# Patient Record
Sex: Male | Born: 1985
Health system: Southern US, Community
[De-identification: ages and names within clinical notes are randomized; demographics above are authoritative.]

## PROBLEM LIST (undated history)

## (undated) DIAGNOSIS — E119 Type 2 diabetes mellitus without complications: Secondary | ICD-10-CM

## (undated) DIAGNOSIS — I10 Essential (primary) hypertension: Secondary | ICD-10-CM

## (undated) HISTORY — PX: CARDIAC CATHETERIZATION: SHX172

## (undated) NOTE — *Deleted (*Deleted)
***In Progress*** Referring Physician: PCP: Sean Brunette, MD HF Cardiology: Dr. Shirlee Latch  HPI:  [name] is a 4 y.o. with a history of HTN, obesity, ETOH abuse, systolic heart failure, PE and diabetes who presents for followup of CHF.    Recently admitted early 7/21 with increased dyspnea and leg edema. ECHO showed reduced EF 20-25%. Diuresed with IV furosemide and transitioned to PO furosemide 80 mg daily. Overall diuresed 35 pounds. Had LHC/RHC that showed normal coronaries and elevated filling pressures, CI 2.0. Also new diagnosis of T2DM, Hgb A1c 11.4. Now on metformin + dapagliflozin. He was discharged home 12/03/19 on GDMT w/ plans to repeat 2D echo in 3 months.   Shortly after discharge, he was readmitted on 12/12/19 by IM for acute bilateral PE, he had been sedentary at work and at home. Was referred to ED by PCP for dyspnea and hypoxia. D-dimer elevated at 6.61. EKG showed sinus tachycardia. CT angiogram of the chest confirmed bilateral pulmonary embolism with mild right heart strain. Repeat limited echo showed RV to be mildly enlarged but systolic function normal. LVEF was 30-35%, mildly improved from previous study. BLE Dopplers negative for DVT. Given BMI of 35, not felt to be ideal candidate for DOAC. He was placed on coumadin w/ heparin bridge.   At last appointment with Dr. Shirlee Latch on 02/24/20, he had lost another 25 lbs compared to last appointment. Generally feeling well.  No dyspnea walking on flat ground.  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  Occasional lightheadedness if he stands too fast.  No chest pain.  No orthopnea/PND.   Today he returns to HF clinic for pharmacist medication titration. At last visit with Dr. Shirlee Latch on 02/24/20, carvedilol was increased to dose of 6.25 mg BID and furosemide was changed from daily to PRN.    Overall feeling ***. Dizziness, lightheadedness, fatigue:   Chest pain or palpitations.  How is your breathing?: *** SOB Able to complete all ADLs. Activity level ***  Weight at home pounds. Takes furosemide/torsemide/bumex *** mg *** daily.  PND/Orthopnea:   Appetite ***.   HF Medications: Carvedilol 6.25 mg BID  Entresto 97/103 mg BID  Spironolactone 25 mg daily  Dapagliflozin/metformin 09-998 mg BID Digoxin 0.125 mg daily Furosemide 80 mg PRN for excess fluid  Has the patient been experiencing any side effects to the medications prescribed?  {YES NO:22349}  Does the patient have any problems obtaining medications due to transportation or finances?   {YES [employer]  Understanding of regimen: {excellent/good/fair/poor:19665} Understanding of indications: {excellent/good/fair/poor:19665} Potential of compliance: {excellent/good/fair/poor:19665} Patient understands to avoid NSAIDs. Patient understands to avoid decongestants.    Pertinent Lab Values 02/24/20: . Serum creatinine 1.45, BUN 24, Potassium 4.0, Sodium 134, Digoxin 0.8   Vital Signs: . Weight: *** (last clinic weight: 402 lbs) . Blood pressure: ***  . Heart rate: ***   Assessment:  1. Chronic Systolic CHF: Nonischemic cardiomyopathy.  Echo 11/28/19 was difficult but EF appeared to be 20-25%. Cause is uncertain. However, heavy ETOH may play a role as well as HTN and untreated diabetes. Cannot rule out viral myocarditis. LHC showed no coronary disease. RHC with low CI at 2.0. Repeat Limited Echo 12/12/19 showed slight improvement in LVEF, 30-35%. RV systolic function normal.  On exam today, he is not volume overloaded***.  NYHA class II symptoms.  - Continue furosemide 80 mg PRN for excess fluid  - Continue  carvedilol 6.25 mg BID  - Continue Entresto97/103 BID - Continue spironolactone 25 mg daily. - Continue dapagliflozin-metformin 09/998 mg BID  - Continue digoxin 0.125 daily.  - Continue to remain abstinent from ETOH. - If EF remains <35% on  repeat echo 6 months post-initial echo, will need referral to EP for ICD. ECG is narrow complex so not CRT candidate 2. Bilateral PE: diagnosed 12/13/19. Bilateral LE venous dopplers negative for PE. Limited echo w/ mildly enlarged RV but normal systolic function. Suspect triggered by sedentary lifestyle + CHF.   - Continue warfarin.  3. Type 2 diabetes: Diagnosed in 7/21 with hgbA1c 11.  - on Dapagliglozin/Metformin 09-998 mg BID - followed by PCP   3. OSA: Needs CPAP titration.  4. ETOH abuse: Former heavy drinker. He reports he has quit 5. Obesity: Continue diet/exercise efforts.    Plan: 1) Medication changes: Based on clinical presentation, vital signs and recent labs will *** 2) Labs: *** 3) Follow-up: ***   Karle Plumber, PharmD, BCPS, BCCP, CPP Heart Failure Clinic Pharmacist [phone]

---
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
## 2005-01-03 ENCOUNTER — Encounter: Admission: RE | Admit: 2005-01-03 | Discharge: 2005-01-03 | Payer: Self-pay | Admitting: Sports Medicine

## 2009-01-21 ENCOUNTER — Encounter: Admission: RE | Admit: 2009-01-21 | Discharge: 2009-01-21 | Payer: Self-pay | Admitting: Internal Medicine

## 2016-09-05 DIAGNOSIS — E559 Vitamin D deficiency, unspecified: Secondary | ICD-10-CM | POA: Diagnosis not present

## 2016-09-05 DIAGNOSIS — I10 Essential (primary) hypertension: Secondary | ICD-10-CM | POA: Diagnosis not present

## 2016-09-05 DIAGNOSIS — Z0001 Encounter for general adult medical examination with abnormal findings: Secondary | ICD-10-CM | POA: Diagnosis not present

## 2016-09-05 DIAGNOSIS — R7309 Other abnormal glucose: Secondary | ICD-10-CM | POA: Diagnosis not present

## 2016-09-14 DIAGNOSIS — Z0000 Encounter for general adult medical examination without abnormal findings: Secondary | ICD-10-CM | POA: Diagnosis not present

## 2016-09-14 DIAGNOSIS — R739 Hyperglycemia, unspecified: Secondary | ICD-10-CM | POA: Diagnosis not present

## 2017-08-03 DIAGNOSIS — E785 Hyperlipidemia, unspecified: Secondary | ICD-10-CM | POA: Diagnosis not present

## 2017-08-03 DIAGNOSIS — E559 Vitamin D deficiency, unspecified: Secondary | ICD-10-CM | POA: Diagnosis not present

## 2017-08-03 DIAGNOSIS — R7309 Other abnormal glucose: Secondary | ICD-10-CM | POA: Diagnosis not present

## 2017-08-03 DIAGNOSIS — I10 Essential (primary) hypertension: Secondary | ICD-10-CM | POA: Diagnosis not present

## 2017-08-09 DIAGNOSIS — Z0000 Encounter for general adult medical examination without abnormal findings: Secondary | ICD-10-CM | POA: Diagnosis not present

## 2017-08-09 DIAGNOSIS — I10 Essential (primary) hypertension: Secondary | ICD-10-CM | POA: Diagnosis not present

## 2019-11-27 ENCOUNTER — Inpatient Hospital Stay (HOSPITAL_BASED_OUTPATIENT_CLINIC_OR_DEPARTMENT_OTHER)
Admission: EM | Admit: 2019-11-27 | Discharge: 2019-12-03 | DRG: 286 | Disposition: A | Discharge status: Home / Self Care | Payer: 59 | Attending: Cardiology | Admitting: Cardiology

## 2019-11-27 ENCOUNTER — Other Ambulatory Visit: Payer: Self-pay

## 2019-11-27 ENCOUNTER — Emergency Department (HOSPITAL_BASED_OUTPATIENT_CLINIC_OR_DEPARTMENT_OTHER): Payer: 59

## 2019-11-27 ENCOUNTER — Encounter (HOSPITAL_BASED_OUTPATIENT_CLINIC_OR_DEPARTMENT_OTHER): Payer: Self-pay

## 2019-11-27 DIAGNOSIS — I5021 Acute systolic (congestive) heart failure: Secondary | ICD-10-CM | POA: Diagnosis present

## 2019-11-27 DIAGNOSIS — I429 Cardiomyopathy, unspecified: Secondary | ICD-10-CM | POA: Diagnosis present

## 2019-11-27 DIAGNOSIS — Z9981 Dependence on supplemental oxygen: Secondary | ICD-10-CM

## 2019-11-27 DIAGNOSIS — I5020 Unspecified systolic (congestive) heart failure: Secondary | ICD-10-CM

## 2019-11-27 DIAGNOSIS — I110 Hypertensive heart disease with heart failure: Secondary | ICD-10-CM | POA: Diagnosis not present

## 2019-11-27 DIAGNOSIS — E8770 Fluid overload, unspecified: Secondary | ICD-10-CM | POA: Diagnosis not present

## 2019-11-27 DIAGNOSIS — E1165 Type 2 diabetes mellitus with hyperglycemia: Secondary | ICD-10-CM | POA: Diagnosis present

## 2019-11-27 DIAGNOSIS — I509 Heart failure, unspecified: Secondary | ICD-10-CM

## 2019-11-27 DIAGNOSIS — J9691 Respiratory failure, unspecified with hypoxia: Secondary | ICD-10-CM | POA: Diagnosis present

## 2019-11-27 DIAGNOSIS — Z20822 Contact with and (suspected) exposure to covid-19: Secondary | ICD-10-CM | POA: Diagnosis present

## 2019-11-27 DIAGNOSIS — R000 Tachycardia, unspecified: Secondary | ICD-10-CM | POA: Diagnosis present

## 2019-11-27 DIAGNOSIS — I10 Essential (primary) hypertension: Secondary | ICD-10-CM

## 2019-11-27 DIAGNOSIS — Z452 Encounter for adjustment and management of vascular access device: Secondary | ICD-10-CM

## 2019-11-27 DIAGNOSIS — I428 Other cardiomyopathies: Secondary | ICD-10-CM | POA: Diagnosis present

## 2019-11-27 DIAGNOSIS — F1010 Alcohol abuse, uncomplicated: Secondary | ICD-10-CM | POA: Diagnosis present

## 2019-11-27 DIAGNOSIS — R945 Abnormal results of liver function studies: Secondary | ICD-10-CM | POA: Diagnosis present

## 2019-11-27 DIAGNOSIS — Z79899 Other long term (current) drug therapy: Secondary | ICD-10-CM

## 2019-11-27 DIAGNOSIS — Z6841 Body Mass Index (BMI) 40.0 and over, adult: Secondary | ICD-10-CM

## 2019-11-27 DIAGNOSIS — N5089 Other specified disorders of the male genital organs: Secondary | ICD-10-CM

## 2019-11-27 HISTORY — DX: Essential (primary) hypertension: I10

## 2019-11-27 LAB — URINALYSIS, ROUTINE W REFLEX MICROSCOPIC
Glucose, UA: 250 mg/dL — AB
Ketones, ur: NEGATIVE mg/dL
Leukocytes,Ua: NEGATIVE
Nitrite: NEGATIVE
Protein, ur: 100 mg/dL — AB
Specific Gravity, Urine: >1.03 — ABNORMAL HIGH (ref 1.005–1.030)
pH: 6 (ref 5.0–8.0)

## 2019-11-27 LAB — BASIC METABOLIC PANEL
Anion gap: 12 (ref 5–15)
BUN: 18 mg/dL (ref 6–20)
CO2: 26 mmol/L (ref 22–32)
Calcium: 8.7 mg/dL — ABNORMAL LOW (ref 8.9–10.3)
Chloride: 92 mmol/L — ABNORMAL LOW (ref 98–111)
Creatinine, Ser: 0.98 mg/dL (ref 0.61–1.24)
GFR calc Af Amer: >60 mL/min (ref >60)
GFR calc non Af Amer: >60 mL/min (ref >60)
Glucose, Bld: 248 mg/dL — ABNORMAL HIGH (ref 70–99)
Potassium: 4.3 mmol/L (ref 3.5–5.1)
Sodium: 130 mmol/L — ABNORMAL LOW (ref 135–145)

## 2019-11-27 LAB — URINALYSIS, MICROSCOPIC (REFLEX)

## 2019-11-27 LAB — BRAIN NATRIURETIC PEPTIDE: B Natriuretic Peptide: 441.1 pg/mL — ABNORMAL HIGH (ref 0.0–100.0)

## 2019-11-27 LAB — SARS CORONAVIRUS 2 BY RT PCR (HOSPITAL ORDER, PERFORMED IN ~~LOC~~ HOSPITAL LAB): SARS Coronavirus 2: NEGATIVE

## 2019-11-27 LAB — CBC
HCT: 47 % (ref 39.0–52.0)
Hemoglobin: 15.1 g/dL (ref 13.0–17.0)
MCH: 28.5 pg (ref 26.0–34.0)
MCHC: 32.1 g/dL (ref 30.0–36.0)
MCV: 88.8 fL (ref 80.0–100.0)
Platelets: 246 10*3/uL (ref 150–400)
RBC: 5.29 MIL/uL (ref 4.22–5.81)
RDW: 14.2 % (ref 11.5–15.5)
WBC: 10.4 10*3/uL (ref 4.0–10.5)
nRBC: 0 % (ref 0.0–0.2)

## 2019-11-27 LAB — TROPONIN I (HIGH SENSITIVITY)
Troponin I (High Sensitivity): 55 ng/L — ABNORMAL HIGH (ref <18)
Troponin I (High Sensitivity): 57 ng/L — ABNORMAL HIGH (ref <18)

## 2019-11-27 LAB — CBG MONITORING, ED: Glucose-Capillary: 258 mg/dL — ABNORMAL HIGH (ref 70–99)

## 2019-11-27 MED ORDER — FUROSEMIDE 10 MG/ML IJ SOLN
40.0000 mg | Freq: Once | INTRAMUSCULAR | Status: AC
  Administered 2019-11-27: 40 mg via INTRAVENOUS
  Filled 2019-11-27: qty 4

## 2019-11-27 MED ORDER — SODIUM CHLORIDE 0.9% FLUSH
3.0000 mL | Freq: Once | INTRAVENOUS | Status: DC
  Filled 2019-11-27: qty 3

## 2019-11-27 NOTE — ED Notes (Signed)
amb  In hall with pulse ox , sitting os st 92%, amb 95%, MD ware

## 2019-11-27 NOTE — ED Provider Notes (Signed)
[facility] EMERGENCY DEPARTMENT Provider Note   CSN: [id number] Arrival date & time: 11/27/19  1230     History Chief Complaint  Patient presents with  . Leg Swelling    Sean Lawson is a 34 y.o. male.  The history is provided by the patient, a parent and medical records. No language interpreter was used.  Shortness of Breath Severity:  Severe Onset quality:  Gradual Duration:  2 weeks Timing:  Sporadic Progression:  Waxing and waning Chronicity:  New Context: activity and URI   Relieved by:  Rest Worsened by:  Exertion and coughing Ineffective treatments:  None tried Associated symptoms: cough and vomiting   Associated symptoms: no abdominal pain, no chest pain, no fever, no headaches, no neck pain, no rash, no sore throat and no wheezing   Risk factors: obesity   Risk factors: no hx of PE/DVT and no prolonged immobilization        Past Medical History:  Diagnosis Date  . Hypertension     There are no problems to display for this patient.   History reviewed. No pertinent surgical history.     No family history on file.  Social History   Tobacco Use  . Smoking status: Never Smoker  . Smokeless tobacco: Former Clinical biochemist  . Vaping Use: Never used  Substance Use Topics  . Alcohol use: Yes    Comment: weekly  . Drug use: Never    Home Medications Prior to Admission medications   Not on File    Allergies    Patient has no known allergies.  Review of Systems   Review of Systems  Constitutional: Positive for fatigue. Negative for chills and fever.  HENT: Positive for congestion. Negative for sore throat.   Eyes: Negative for visual disturbance.  Respiratory: Positive for cough, chest tightness and shortness of breath. Negative for wheezing.   Cardiovascular: Positive for leg swelling. Negative for chest pain and palpitations.  Gastrointestinal: Positive for nausea and vomiting. Negative for abdominal pain, constipation and  diarrhea.  Genitourinary: Positive for decreased urine volume. Negative for flank pain and frequency.  Musculoskeletal: Negative for back pain, neck pain and neck stiffness.  Skin: Negative for rash and wound.  Neurological: Negative for weakness, light-headedness, numbness and headaches.  Psychiatric/Behavioral: Negative for agitation and confusion.  All other systems reviewed and are negative.   Physical Exam Updated Vital Signs BP (!) 147/101 (BP Location: Left Arm)   Pulse (!) 106   Temp 98.2 F (36.8 C) (Oral)   Resp 20   Ht 6\' 2"  (1.88 m)   Wt (!) 215.5 kg   SpO2 95%   BMI 60.99 kg/m   Physical Exam Vitals and nursing note reviewed.  Constitutional:      General: He is not in acute distress.    Appearance: He is well-developed. He is not ill-appearing, toxic-appearing or diaphoretic.  HENT:     Head: Normocephalic and atraumatic.     Right Ear: External ear normal.     Left Ear: External ear normal.     Nose: Congestion present. No rhinorrhea.     Mouth/Throat:     Mouth: Mucous membranes are moist.     Pharynx: No oropharyngeal exudate or posterior oropharyngeal erythema.  Eyes:     Extraocular Movements: Extraocular movements intact.     Conjunctiva/sclera: Conjunctivae normal.     Pupils: Pupils are equal, round, and reactive to light.  Cardiovascular:     Rate and Rhythm:  Normal rate and regular rhythm.     Pulses: Normal pulses.     Heart sounds: No murmur heard.   Pulmonary:     Effort: Pulmonary effort is normal. No respiratory distress.     Breath sounds: No stridor. Rales present. No wheezing or rhonchi.  Chest:     Chest wall: No tenderness.  Abdominal:     General: Abdomen is flat.     Palpations: Abdomen is soft.     Tenderness: There is no abdominal tenderness. There is no right CVA tenderness, left CVA tenderness, guarding or rebound.  Musculoskeletal:        General: No tenderness.     Cervical back: Normal range of motion and neck supple.  No tenderness.     Right lower leg: Edema present.     Left lower leg: Edema present.  Skin:    General: Skin is warm and dry.     Findings: No erythema or rash.  Neurological:     General: No focal deficit present.     Mental Status: He is alert and oriented to person, place, and time.     Cranial Nerves: No cranial nerve deficit.     Sensory: No sensory deficit.     Motor: No weakness or abnormal muscle tone.     Coordination: Coordination normal.     Deep Tendon Reflexes: Reflexes normal.  Psychiatric:        Mood and Affect: Mood normal.     ED Results / Procedures / Treatments   Labs (all labs ordered are listed, but only abnormal results are displayed) Labs Reviewed  BASIC METABOLIC PANEL - Abnormal; Notable for the following components:      Result Value   Sodium 130 (*)    Chloride 92 (*)    Glucose, Bld 248 (*)    Calcium 8.7 (*)    All other components within normal limits  URINALYSIS, ROUTINE W REFLEX MICROSCOPIC - Abnormal; Notable for the following components:   Color, Urine AMBER (*)    Specific Gravity, Urine >1.030 (*)    Glucose, UA 250 (*)    Hgb urine dipstick TRACE (*)    Bilirubin Urine SMALL (*)    Protein, ur 100 (*)    All other components within normal limits  BRAIN NATRIURETIC PEPTIDE - Abnormal; Notable for the following components:   B Natriuretic Peptide 441.1 (*)    All other components within normal limits  URINALYSIS, MICROSCOPIC (REFLEX) - Abnormal; Notable for the following components:   Bacteria, UA FEW (*)    All other components within normal limits  CBG MONITORING, ED - Abnormal; Notable for the following components:   Glucose-Capillary 258 (*)    All other components within normal limits  TROPONIN I (HIGH SENSITIVITY) - Abnormal; Notable for the following components:   Troponin I (High Sensitivity) 55 (*)    All other components within normal limits  TROPONIN I (HIGH SENSITIVITY) - Abnormal; Notable for the following components:    Troponin I (High Sensitivity) 57 (*)    All other components within normal limits  SARS CORONAVIRUS 2 BY RT PCR (HOSPITAL ORDER, PERFORMED IN [facility])  CBC    EKG EKG Interpretation  Date/Time:  Wednesday November 27 2019 13:09:44 EDT Ventricular Rate:  111 PR Interval:  176 QRS Duration: 98 QT Interval:  346 QTC Calculation: 470 R Axis:   141 Text Interpretation: Sinus tachycardia Right axis deviation Cannot rule out Anterior infarct , age  undetermined Abnormal ECG No old tracing to compare Confirmed by Susy Frizzle [phone]) on 11/27/2019 1:11:52 PM   Radiology DG Chest 2 View  Result Date: 11/27/2019 CLINICAL DATA:  Bilateral lower extremity swelling and abdominal bloating for 6 days. EXAM: CHEST - 2 VIEW COMPARISON:  None. FINDINGS: There is cardiomegaly and mild interstitial edema. No consolidative process, pneumothorax or pleural effusion. No acute or focal bony abnormality. IMPRESSION: Cardiomegaly and mild interstitial edema. Electronically Signed   By: Drusilla Kanner M.D.   On: 11/27/2019 14:01    Procedures Procedures (including critical care time)  Medications Ordered in ED Medications  furosemide (LASIX) injection 40 mg (40 mg Intravenous Given 11/27/19 2056)    ED Course  I have reviewed the triage vital signs and the nursing notes.  Pertinent labs & imaging results that were available during my care of the patient were reviewed by me and considered in my medical decision making (see chart for details).    MDM Rules/Calculators/A&P                          Sean Lawson is a 34 y.o. male with a past medical history significant for hypertension currently untreated who presents with generalized edema, severe fatigue, cough with associated episodes of nausea vomiting, and exertional shortness of breath.  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  He reports he had worsening shortness of breath with any exertion which is new.  He denies any chest pains with it or palpitations.  He reports the nausea and vomiting only occurs when he is having coughing fits.  He denies any hemoptysis.  He denies history of DVT or PE.  He denies any unilateral component of the swelling.  He says that he has never been want to lay flat and typically lays on his side.  He denies any constipation or diarrhea.  Denies any acute urinary changes.  He reports he was sent from urgent care for further evaluation and work-up and had a Covid test performed this morning.  He is not having any fevers at home.  On my evaluation, patient does have rales in the base of his lungs.  He has edema in both of his legs and his abdomen.  Abdomen was nontender and legs are nontender.  Good pulses present in extremities.  No murmur.  No rhonchi or wheezing.  Patient was slightly tachycardic on my evaluation and his oxygen saturation was wax and wane between the low 90s and mid 80s on room air while speaking with me.  EKG showed no STEMI.  Clinically I am concerned about new heart failure given the symmetric edema ascending towards his torso, exertional shortness of breath, any hypoxia I am seeing on my exam.  Patient had work-up started in triage including a BNP which is elevated at 441.  No previous to compare.  Glucose is elevated at 248 but he denies history of diabetes.  Patient does have low sodium and chloride likely due to the nausea vomiting and decreased oral intake as he is reporting.  He reports no dysuria and his urinalysis does not show any nitrites or leukocytes.  There was some protein and bilirubin in the urine.  Also some hemoglobin and glucose.  Troponin is still in process.  CBC shows no anemia or leukocytosis.  X-ray shows cardiomegaly and pulmonary edema.  I am very concerned about new heart failure in this patient.  Patient was allowed to  ambulate to see was oxygen saturation with you.  Surprisingly, they were in the 90s during his ambulation however when I continue to assess the patient it does drop again into the 80s.  Anticipate shared decision-making conversation to discuss disposition either follow-up with cardiology at discharge versus admission for diuresis and echo and further cardiac work-up for new heart failure causing hypoxia.  7:34 PM I reassessed the patient and his oxygen continues to drop into the 80s at times. The lowest I saw was 83% on room air. It then bounces back into the low mid 90s. Patient's troponins returned and were elevated at 55 and 57 respectively. Had a shared decision-making conversation with patient and family and we discussed all of the concerning findings for new heart failure with the rales on exam, peripheral edema, x-ray showing edema and cardiomegaly, elevated BNP, and elevated troponin with the hypoxia. They agreed to let us call cardiology to discuss admission for echo and further cardiology evaluation.  Cards to call for recommendations.  Cardiology agreed with admission.  They requested 40 of Lasix to get started.  They agreed with admission to their service at [facility] for further evaluation and management of new heart failure.  Patient agrees to admission and will be admitted.   Final Clinical Impression(s) / ED Diagnoses Final diagnoses:  Hypervolemia, unspecified hypervolemia type    Clinical Impression: 1. Hypervolemia, unspecified hypervolemia type     Disposition: Admit  This note was prepared with assistance of Dragon voice recognition software. Occasional wrong-word or sound-a-like substitutions may have occurred due to the inherent limitations of voice recognition software.     Jayven Naill, Canary Brim, MD 11/27/19 [phone]

## 2019-11-27 NOTE — ED Notes (Signed)
ED Provider at bedside. 

## 2019-11-27 NOTE — ED Triage Notes (Signed)
Pt c/o bilat leg swelling, abd bloating x 6 days-sent from UC stating "new onset of diabetes"-also c/o prod cough, n/v x 1 week-NAD-to triage in w/c

## 2019-11-28 ENCOUNTER — Inpatient Hospital Stay (HOSPITAL_COMMUNITY): Payer: 59

## 2019-11-28 ENCOUNTER — Encounter (HOSPITAL_COMMUNITY): Payer: Self-pay | Admitting: Student in an Organized Health Care Education/Training Program

## 2019-11-28 DIAGNOSIS — E8770 Fluid overload, unspecified: Secondary | ICD-10-CM | POA: Diagnosis present

## 2019-11-28 DIAGNOSIS — Z6841 Body Mass Index (BMI) 40.0 and over, adult: Secondary | ICD-10-CM | POA: Diagnosis not present

## 2019-11-28 DIAGNOSIS — I10 Essential (primary) hypertension: Secondary | ICD-10-CM | POA: Diagnosis not present

## 2019-11-28 DIAGNOSIS — I429 Cardiomyopathy, unspecified: Secondary | ICD-10-CM | POA: Diagnosis present

## 2019-11-28 DIAGNOSIS — Z79899 Other long term (current) drug therapy: Secondary | ICD-10-CM | POA: Diagnosis not present

## 2019-11-28 DIAGNOSIS — I5021 Acute systolic (congestive) heart failure: Secondary | ICD-10-CM | POA: Diagnosis present

## 2019-11-28 DIAGNOSIS — I509 Heart failure, unspecified: Secondary | ICD-10-CM | POA: Diagnosis not present

## 2019-11-28 DIAGNOSIS — R000 Tachycardia, unspecified: Secondary | ICD-10-CM | POA: Diagnosis present

## 2019-11-28 DIAGNOSIS — R945 Abnormal results of liver function studies: Secondary | ICD-10-CM | POA: Diagnosis present

## 2019-11-28 DIAGNOSIS — F1010 Alcohol abuse, uncomplicated: Secondary | ICD-10-CM | POA: Diagnosis present

## 2019-11-28 DIAGNOSIS — I428 Other cardiomyopathies: Secondary | ICD-10-CM | POA: Diagnosis present

## 2019-11-28 DIAGNOSIS — I110 Hypertensive heart disease with heart failure: Secondary | ICD-10-CM | POA: Diagnosis present

## 2019-11-28 DIAGNOSIS — Z20822 Contact with and (suspected) exposure to covid-19: Secondary | ICD-10-CM | POA: Diagnosis present

## 2019-11-28 DIAGNOSIS — E119 Type 2 diabetes mellitus without complications: Secondary | ICD-10-CM | POA: Diagnosis not present

## 2019-11-28 DIAGNOSIS — I5043 Acute on chronic combined systolic (congestive) and diastolic (congestive) heart failure: Secondary | ICD-10-CM | POA: Diagnosis not present

## 2019-11-28 DIAGNOSIS — J9691 Respiratory failure, unspecified with hypoxia: Secondary | ICD-10-CM | POA: Diagnosis present

## 2019-11-28 DIAGNOSIS — Z9981 Dependence on supplemental oxygen: Secondary | ICD-10-CM | POA: Diagnosis not present

## 2019-11-28 DIAGNOSIS — E1165 Type 2 diabetes mellitus with hyperglycemia: Secondary | ICD-10-CM | POA: Diagnosis present

## 2019-11-28 LAB — CBC
HCT: 45 % (ref 39.0–52.0)
Hemoglobin: 14.1 g/dL (ref 13.0–17.0)
MCH: 28 pg (ref 26.0–34.0)
MCHC: 31.3 g/dL (ref 30.0–36.0)
MCV: 89.5 fL (ref 80.0–100.0)
Platelets: 227 10*3/uL (ref 150–400)
RBC: 5.03 MIL/uL (ref 4.22–5.81)
RDW: 14.5 % (ref 11.5–15.5)
WBC: 10.1 10*3/uL (ref 4.0–10.5)
nRBC: 0 % (ref 0.0–0.2)

## 2019-11-28 LAB — TSH: TSH: 1.976 u[IU]/mL (ref 0.350–4.500)

## 2019-11-28 LAB — BASIC METABOLIC PANEL
Anion gap: 12 (ref 5–15)
BUN: 14 mg/dL (ref 6–20)
CO2: 33 mmol/L — ABNORMAL HIGH (ref 22–32)
Calcium: 8.5 mg/dL — ABNORMAL LOW (ref 8.9–10.3)
Chloride: 91 mmol/L — ABNORMAL LOW (ref 98–111)
Creatinine, Ser: 1.1 mg/dL (ref 0.61–1.24)
GFR calc Af Amer: >60 mL/min (ref >60)
GFR calc non Af Amer: >60 mL/min (ref >60)
Glucose, Bld: 218 mg/dL — ABNORMAL HIGH (ref 70–99)
Potassium: 4.3 mmol/L (ref 3.5–5.1)
Sodium: 136 mmol/L (ref 135–145)

## 2019-11-28 LAB — LIPID PANEL
Cholesterol: 122 mg/dL (ref 0–200)
HDL: 22 mg/dL — ABNORMAL LOW (ref >40)
LDL Cholesterol: 82 mg/dL (ref 0–99)
Total CHOL/HDL Ratio: 5.5 RATIO
Triglycerides: 89 mg/dL (ref <150)
VLDL: 18 mg/dL (ref 0–40)

## 2019-11-28 LAB — HIV ANTIBODY (ROUTINE TESTING W REFLEX): HIV Screen 4th Generation wRfx: NONREACTIVE

## 2019-11-28 LAB — PROTIME-INR
INR: 1.4 — ABNORMAL HIGH (ref 0.8–1.2)
Prothrombin Time: 16.6 seconds — ABNORMAL HIGH (ref 11.4–15.2)

## 2019-11-28 LAB — GLUCOSE, CAPILLARY
Glucose-Capillary: 194 mg/dL — ABNORMAL HIGH (ref 70–99)
Glucose-Capillary: 212 mg/dL — ABNORMAL HIGH (ref 70–99)
Glucose-Capillary: 239 mg/dL — ABNORMAL HIGH (ref 70–99)
Glucose-Capillary: 181 mg/dL — ABNORMAL HIGH (ref 70–99)
Glucose-Capillary: 188 mg/dL — ABNORMAL HIGH (ref 70–99)

## 2019-11-28 LAB — ECHOCARDIOGRAM COMPLETE
Height: 74.2 in
Weight: 8151.73 oz

## 2019-11-28 LAB — MAGNESIUM: Magnesium: 1.2 mg/dL — ABNORMAL LOW (ref 1.7–2.4)

## 2019-11-28 LAB — HEMOGLOBIN A1C
Hgb A1c MFr Bld: 11.4 % — ABNORMAL HIGH (ref 4.8–5.6)
Mean Plasma Glucose: 280.48 mg/dL

## 2019-11-28 LAB — CBG MONITORING, ED: Glucose-Capillary: 208 mg/dL — ABNORMAL HIGH (ref 70–99)

## 2019-11-28 LAB — APTT: aPTT: 30 seconds (ref 24–36)

## 2019-11-28 MED ORDER — SODIUM CHLORIDE 0.9 % IV SOLN: 250.0000 mL | INTRAVENOUS | Status: DC | PRN

## 2019-11-28 MED ORDER — LISINOPRIL 20 MG PO TABS
20.0000 mg | ORAL_TABLET | Freq: Every day | ORAL | Status: DC
  Administered 2019-11-28: 20 mg via ORAL
  Filled 2019-11-28: qty 1

## 2019-11-28 MED ORDER — ACETAMINOPHEN 325 MG PO TABS
650.0000 mg | ORAL_TABLET | ORAL | Status: DC | PRN
  Administered 2019-11-28 – 2019-11-29 (×5): 650 mg via ORAL
  Filled 2019-11-28 (×5): qty 2

## 2019-11-28 MED ORDER — MAGNESIUM SULFATE 2 GM/50ML IV SOLN
2.0000 g | Freq: Once | INTRAVENOUS | Status: AC
  Administered 2019-11-28: 2 g via INTRAVENOUS
  Filled 2019-11-28: qty 50

## 2019-11-28 MED ORDER — SODIUM CHLORIDE 0.9% FLUSH
3.0000 mL | INTRAVENOUS | Status: DC | PRN
  Administered 2019-11-28: 3 mL via INTRAVENOUS

## 2019-11-28 MED ORDER — ENOXAPARIN SODIUM 120 MG/0.8ML ~~LOC~~ SOLN
120.0000 mg | SUBCUTANEOUS | Status: DC
  Administered 2019-11-28 – 2019-11-29 (×2): 120 mg via SUBCUTANEOUS
  Filled 2019-11-28 (×4): qty 0.8

## 2019-11-28 MED ORDER — FUROSEMIDE 10 MG/ML IJ SOLN
40.0000 mg | Freq: Once | INTRAMUSCULAR | Status: AC
  Administered 2019-11-28: 40 mg via INTRAVENOUS
  Filled 2019-11-28: qty 4

## 2019-11-28 MED ORDER — SODIUM CHLORIDE 0.9% FLUSH
3.0000 mL | Freq: Two times a day (BID) | INTRAVENOUS | Status: DC
  Administered 2019-11-28 – 2019-12-01 (×4): 3 mL via INTRAVENOUS

## 2019-11-28 MED ORDER — ONDANSETRON HCL 4 MG/2ML IJ SOLN: 4.0000 mg | Freq: Four times a day (QID) | INTRAMUSCULAR | Status: DC | PRN

## 2019-11-28 MED ORDER — PERFLUTREN LIPID MICROSPHERE
1.0000 mL | INTRAVENOUS | Status: AC | PRN
  Administered 2019-11-28: 5 mL via INTRAVENOUS
  Filled 2019-11-28: qty 10

## 2019-11-28 MED ORDER — ENOXAPARIN SODIUM 40 MG/0.4ML ~~LOC~~ SOLN: 40.0000 mg | SUBCUTANEOUS | Status: DC

## 2019-11-28 MED ORDER — INSULIN ASPART 100 UNIT/ML ~~LOC~~ SOLN
0.0000 [IU] | Freq: Three times a day (TID) | SUBCUTANEOUS | Status: DC
  Administered 2019-11-28: 3 [IU] via SUBCUTANEOUS
  Administered 2019-11-28 – 2019-11-29 (×2): 5 [IU] via SUBCUTANEOUS
  Administered 2019-11-29: 3 [IU] via SUBCUTANEOUS
  Administered 2019-11-29: 5 [IU] via SUBCUTANEOUS
  Administered 2019-11-30: 3 [IU] via SUBCUTANEOUS
  Administered 2019-11-30: 11 [IU] via SUBCUTANEOUS
  Administered 2019-11-30: 8 [IU] via SUBCUTANEOUS
  Administered 2019-12-01: 5 [IU] via SUBCUTANEOUS
  Administered 2019-12-01: 2 [IU] via SUBCUTANEOUS
  Administered 2019-12-01: 5 [IU] via SUBCUTANEOUS
  Administered 2019-12-02: 3 [IU] via SUBCUTANEOUS
  Administered 2019-12-02: 5 [IU] via SUBCUTANEOUS
  Administered 2019-12-02: 3 [IU] via SUBCUTANEOUS
  Administered 2019-12-03: 5 [IU] via SUBCUTANEOUS
  Administered 2019-12-03: 3 [IU] via SUBCUTANEOUS

## 2019-11-28 MED ORDER — FUROSEMIDE 10 MG/ML IJ SOLN
40.0000 mg | Freq: Two times a day (BID) | INTRAMUSCULAR | Status: DC
  Administered 2019-11-28 – 2019-11-30 (×4): 40 mg via INTRAVENOUS
  Filled 2019-11-28 (×5): qty 4

## 2019-11-28 MED ORDER — METOPROLOL TARTRATE 12.5 MG HALF TABLET
12.5000 mg | ORAL_TABLET | Freq: Two times a day (BID) | ORAL | Status: DC
  Administered 2019-11-28 – 2019-11-29 (×3): 12.5 mg via ORAL
  Filled 2019-11-28 (×3): qty 1

## 2019-11-28 NOTE — Progress Notes (Deleted)
Inpatient Diabetes Program Recommendations  AACE/ADA: New Consensus Statement on Inpatient Glycemic Control (2015)  Target Ranges:  Prepandial:   less than 140 mg/dL      Peak postprandial:   less than 180 mg/dL (1-2 hours)      Critically ill patients:  140 - 180 mg/dL   Lab Results  Component Value Date   GLUCAP 212 (H) 11/28/2019   HGBA1C 11.4 (H) 11/28/2019    Review of Glycemic Control Results for SHERVIN, CYPERT (MRN [id number]) as of 11/28/2019 11:46  Ref. Range 11/27/2019 13:05 11/28/2019 03:27 11/28/2019 04:12 11/28/2019 11:39  Glucose-Capillary Latest Ref Range: 70 - 99 mg/dL 222 (H) 979 (H) 892 (H) 212 (H)   Diabetes history: None  Current orders for Inpatient glycemic control: None  Inpatient Diabetes Program Recommendations:    A1c 11.4% this admission, meeting criteria for new DM diagnosis. At this level of A1c pt may require insulin at time of d/c. Will need close follow up with PCP.  May consider Hospitalist consult for medication dosing for d/c. Please speak with pt and inform him of diagnosis prior to education.   - Order ambulatory referral for diabetes education.  Thanks,  Christena Deem RN, MSN, BC-ADM Inpatient Diabetes Coordinator Team Pager [phone] (8a-5p)

## 2019-11-28 NOTE — Progress Notes (Signed)
Inpatient Diabetes Program    Spoke with patient and mother at bedside about new diabetes diagnosis.  Discussed A1C results (11.4% this admission) and explained what an A1C is. Discussed basic pathophysiology of DM Type 2, basic home care, importance of checking CBGs and maintaining good CBG control to prevent long-term and short-term complications.   Reviewed glucose and A1C goals. Reviewed signs and symptoms of hyperglycemia and hypoglycemia along with treatment for both. Discussed impact of nutrition, exercise, stress, sickness, and medications on diabetes control. Discussed diet, portion sizes, and carbohydrates with pt. Encouraged meal planning and healthy snack.   Ordered Living Well with diabetes booklet and encouraged patient to read through entire book. Informed patient that he may be prescribed oral DM medications hoping to avoid insulin depending on current glucose trends on Novolog Correction scale.  Discussed to take oral medication with food. Asked patient to check his glucose at least 2 times a day (fasting and alternating second check).  Informed patient that RN will be asking him to self perform glucose check before leaving hospital.  Patient verbalized understanding of information discussed and he states that he has no further questions at this time related to diabetes. RNs to provide ongoing basic DM education at bedside with this patient and engage patient to actively check blood glucose and administer insulin injections.   D/c orders: Glucose meter kit order # [id number]  Will reassess in the am regarding the need for basal insulin at time of d/c.  Thanks, Tama Headings RN, MSN, BC-ADM Inpatient Diabetes Coordinator Team Pager [phone] (8a-5p)

## 2019-11-28 NOTE — Plan of Care (Signed)

## 2019-11-28 NOTE — ED Notes (Signed)
Pt transported to [facility] via [facility] 

## 2019-11-28 NOTE — Progress Notes (Signed)
  Echocardiogram 2D Echocardiogram has been performed.  Sean Lawson 11/28/2019, 3:33 PM

## 2019-11-28 NOTE — Progress Notes (Addendum)
Progress Note  Patient Name: Sean Lawson Date of Encounter: 11/28/2019  [facility] Cardiologist: No primary care provider on file.   Subjective   Admitted overnight for new onset CHF. Echo pending. Responding well to Lasix. Coughing improving. Shortness of breath is improving - mainly associated with coughing spells. No chest pain. No palpitations, lightheadedness, dizziness.   Inpatient Medications    Scheduled Meds:  enoxaparin (LOVENOX) injection  120 mg Subcutaneous Q24H   lisinopril  20 mg Oral Daily   metoprolol tartrate  12.5 mg Oral BID   sodium chloride flush  3 mL Intravenous Q12H   Continuous Infusions:  sodium chloride     PRN Meds: sodium chloride, acetaminophen, ondansetron (ZOFRAN) IV, sodium chloride flush   Vital Signs    Vitals:   11/28/19 0258 11/28/19 0425 11/28/19 0648 11/28/19 0826  BP: 127/74 119/75 124/84 113/79  Pulse: (!) 105 (!) 105 (!) 103 (!) 105  Resp: 18 20 20 16   Temp: 98.2 F (36.8 C) 98.2 F (36.8 C) 98.2 F (36.8 C) 98.9 F (37.2 C)  TempSrc: Oral Oral Oral Oral  SpO2: 95% 97% 93% 93%  Weight:  (!) 231.1 kg    Height:  6' 2.2" (1.885 m)      Intake/Output Summary (Last 24 hours) at 11/28/2019 1211 Last data filed at 11/28/2019 0956 Gross per 24 hour  Intake 93 ml  Output 3760 ml  Net -3667 ml   Last 3 Weights 11/28/2019 11/27/2019  Weight (lbs) 509 lb 7.7 oz 475 lb  Weight (kg) 231.1 kg 215.459 kg      Telemetry    Sinus tachycardia with rates in the low 100's. Occasionally in the 120's (suspect this is with ambulation). - Personally Reviewed  ECG    No new ECG tracing today. - Personally Reviewed  Physical Exam   GEN: Morbidly obese Caucasian male. No acute distress.   Neck: Supple. Difficult to assess JVD due to body habitus. Cardiac: Tachycardic. Soft heart sounds. No murmurs, rubs, or gallops appreciated. Respiratory: Clear to auscultation bilaterally. GI: Soft, obese/distended, non-tender. Bowel sounds  present. MS: 2+ pitting edema of bilateral lower extremities.  Neuro:  No focal deficits. Psych: Normal affect. Responds appropriately.  Labs    High Sensitivity Troponin:   Recent Labs  Lab 11/27/19 1654 11/27/19 1831  TROPONINIHS 55* 57*      Chemistry Recent Labs  Lab 11/27/19 1317 11/28/19 0716  NA 130* 136  K 4.3 4.3  CL 92* 91*  CO2 26 33*  GLUCOSE 248* 218*  BUN 18 14  CREATININE 0.98 1.10  CALCIUM 8.7* 8.5*  GFRNONAA >60 >60  GFRAA >60 >60  ANIONGAP 12 12     Hematology Recent Labs  Lab 11/27/19 1317 11/28/19 0716  WBC 10.4 10.1  RBC 5.29 5.03  HGB 15.1 14.1  HCT 47.0 45.0  MCV 88.8 89.5  MCH 28.5 28.0  MCHC 32.1 31.3  RDW 14.2 14.5  PLT 246 227    BNP Recent Labs  Lab 11/27/19 1317  BNP 441.1*     DDimer No results for input(s): DDIMER in the last 168 hours.   Radiology    DG Chest 2 View  Result Date: 11/27/2019 CLINICAL DATA:  Bilateral lower extremity swelling and abdominal bloating for 6 days. EXAM: CHEST - 2 VIEW COMPARISON:  None. FINDINGS: There is cardiomegaly and mild interstitial edema. No consolidative process, pneumothorax or pleural effusion. No acute or focal bony abnormality. IMPRESSION: Cardiomegaly and mild interstitial edema. Electronically  Signed   By: Drusilla Kanner M.D.   On: 11/27/2019 14:01    Cardiac Studies   Echo pending.  Patient Profile     34 y.o. male with a history of hypertension, newly diagnosed type 2 diabetes, morbid obesity, and alcohol abuse who presents with acute onset of shortness of breath and lower extremity edema consistent with new onset CHF. Echo pending.  Assessment & Plan    New Onset Acute CHF - BNP elevated at 441.  - Chest x-ray showed cardiomegaly and mild interstitial edema.  - Echo pending. - Has received 2 doses of IV Lasix 40mg  with good urinary response. 3.7 L of output so far.  - Difficult to accurately assess volume status due to body habitus but patient still volume  overloaded at this time. - Will continue IV Lasix 40mg  twice daily. - Currently on Lisinopril 20mg  daily for now. Suspect EF will be low so will go ahead and stop in anticipation of transitioning to [facility]. - Started on Lopressor 12.5mg  twice daily on admission. Will continue current dose for now and will no up-titrate in acute decompensated settting. - Continue to monitor daily weights, strict I/Os, and renal function.  - The 2 mostly likely etiologies at this time are acute viral illness and heavy alcohol consumption.  - Suspect he will need right/left heart catheterization at some point.  Hypertension - Initially hypertensive but BP much better with diuresis.  - Stop Lisinopril in anticipation of needing Entresto. - Continue Lopressor as above.  Newly Diagnosed Type 2 Diabetes Mellitus - Hemoglobin A1c 11.4.  - Urinalysis at Urgent Care yesterday showed glucose 250 and protein 100.  - Diabetes coordinator consulted recommended Novolog 0-15 units TID. Have placed order. Personally spoke with Diabetes coordinator, , and she will come by later today to speak with patient about new diagnosis. Suspect we will start SGLT2 inhibitor this admission.  Alcohol Abuse - Patient reports consistently drinking a handle of vodka over 4 off days from work (works 4 nights on and 4 nights off). Occasionally drinks more than this.  - Will need to continue to discuss importance of drinking in moderation or complete cessation if EF low and felt to be alcohol induced. Patient states that this will not be a problem.  Hypomagnesemia - Magnesium 1.2. Will supplement. - Potassium 4.3.  - Continue to monitor.  For questions or updates, please contact [facility] Please consult [email or web address] for contact info under        Signed, , PA-C  11/28/2019, 12:11 PM    Personally seen and examined. Agree with above.   Urinating well with IV Lasix.  Discussed with he and his mother.   Understands alcohol can lead to cardiomyopathy.  GEN: Well nourished, well developed, in no acute distress morbidly obese HEENT: normal  Neck: no JVD, carotid bruits, or masses Cardiac: Mildly tachycardic; no murmurs, rubs, or gallops, 4+ edema  Respiratory:  clear to auscultation bilaterally, normal work of breathing GI: soft, nontender, nondistended, + BS MS: no deformity or atrophy  Skin: warm and dry, no rash Neuro:  Alert and Oriented x 3, Strength and sensation are intact Psych: euthymic mood, full affect   Lab work reviewed as above.  Medications reviewed.  Assessment and plan:  Acute systolic heart failure -Continue with IV Lasix.  We will hold lisinopril for the next 36 hours with the anticipation of starting Entresto.  He was started on low-dose metoprolol 12.5 mg twice a day.  Currently not  having any hemodynamic effects.  We will continue.  Discussed left and right heart catheterization in the next couple days.  Depending on echocardiogram, may wish to obtain consultation from advanced heart failure team.  Troponin elevation -55, 57 flat in the setting of acute systolic heart failure.  Uncontrolled diabetes, new diagnosis -Hemoglobin A1c 11, diabetic coordinator consulted.  Donato Schultz, MD

## 2019-11-28 NOTE — Progress Notes (Signed)
[facility] paged to notify of admission.

## 2019-11-28 NOTE — Progress Notes (Signed)
Inpatient Diabetes Program Recommendations  AACE/ADA: New Consensus Statement on Inpatient Glycemic Control (2015)  Target Ranges:  Prepandial:   less than 140 mg/dL      Peak postprandial:   less than 180 mg/dL (1-2 hours)      Critically ill patients:  140 - 180 mg/dL   Lab Results  Component Value Date   GLUCAP 212 (H) 11/28/2019   HGBA1C 11.4 (H) 11/28/2019    Review of Glycemic Control Results for Sean Lawson, Sean Lawson (MRN [id number]) as of 11/28/2019 11:46  Ref. Range 11/27/2019 13:05 11/28/2019 03:27 11/28/2019 04:12 11/28/2019 11:39  Glucose-Capillary Latest Ref Range: 70 - 99 mg/dL 124 (H) 580 (H) 998 (H) 212 (H)   Diabetes history: None  Current orders for Inpatient glycemic control: None  Inpatient Diabetes Program Recommendations:    -  Order Novolog 0-15 units tid and Novolog hs scale.   A1c 11.4% this admission, meeting criteria for new DM diagnosis. At this level of A1c pt may require insulin at time of d/c. Will need close follow up with PCP.  May consider Hospitalist consult for medication dosing for d/c. Please speak with pt and inform him of diagnosis prior to education.   - Order ambulatory referral for outpatient diabetes education.  Based on current glucose trends without insulin on board pt may benefit from sulfonylurea and possibly SGLT-2 inhibitor, may can avoid insulin at this time.  Thanks,  Christena Deem RN, MSN, BC-ADM Inpatient Diabetes Coordinator Team Pager [phone] (8a-5p)

## 2019-11-28 NOTE — Progress Notes (Signed)
Report given to Dois Davenport, RN the charge RN on 4E.   Lillia Pauls RN

## 2019-11-28 NOTE — Progress Notes (Signed)
Admitted via stretcher from [facility] via [facility]. Tele applied,oxygen applied,call bell explained and placed within reach. Clothes, wallet with $23 and license and (3) debit/credit cards,watch and shoes placed in closet. Patient does NOT want anything sent home or locked up. Explained valuable policy. Assessment as charted.

## 2019-11-28 NOTE — H&P (Signed)
Cardiology Admission History and Physical:   Patient ID: Sean Lawson MRN: [id number]; DOB: 1985/08/01   Admission date: 11/27/2019  Primary Care Provider: [name], MD [facility] Cardiologist: No primary care provider on file.  [facility] Electrophysiologist:  None   Chief Complaint: SOB/LE edema  Patient Profile:   30M with HTN and morbid obesity who presents with acute onset LE edema and SOB/DOE c/w newly dx HF.   History of Present Illness:   Sean Lawson reports that last Tuesday he started to have worsening cough which persisted throughout the week.  His cough at times was so severe that it actually caused him to vomit.  Over the weekend he then noticed worsening lower extremity edema along with shortness of breath and profound dyspnea on exertion.  He does note that he has had occasional lower extremity swelling in the past but is usually in the context of significant activity and at the end of the day and is usually gone within 1 day.  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  He does report significant alcohol use when not working.  Usually works 4 nights on and 4 nights off and only works night shift.  During his off days he will often consume a handle of vodka over a 4-day stretch.  He has done his over the past several years.  Occasionally he will drink more than this, most recently on Memorial Day weekend he said it was probably closer to 2 handles.  He does have a sick contact at work who had similar URI symptoms prior to him having the same.  He denies any orthopnea, PND, or significant dyspnea on exertion prior to this week.  He lives at home by himself in a one-story house and had no issue completing ADLs before this week.  Since the weekend he has to stop to catch his breath even just walking from his car into the house.  He denies any chest pain, chest pressure, or  other anginal equivalents.  He does not know if he has any weight gain as he does not check his weight at home.  Past Medical History:  Diagnosis Date  . Hypertension    History reviewed. No pertinent surgical history.   Medications Prior to Admission: Prior to Admission medications   Medication Sig Start Date End Date Taking? Authorizing Provider  amLODipine (NORVASC) 10 MG tablet Take by mouth.    [provider]  lisinopril (ZESTRIL) 10 MG tablet Take by mouth.    [provider]    Allergies:   No Known Allergies  Social History:   Social History   Socioeconomic History  . Marital status: Married    Spouse name: Not on file  . Number of children: Not on file  . Years of education: Not on file  . Highest education level: Not on file  Occupational History  . Not on file  Tobacco Use  . Smoking status: Never Smoker  . Smokeless tobacco: Former Clinical biochemist  . Vaping Use: Never used  Substance and Sexual Activity  . Alcohol use: Yes    Comment: weekly  . Drug use: Never  . Sexual activity: Not on file  Other Topics Concern  . Not on file  Social History Narrative  . Not on file   Social Determinants of Health   Financial Resource Strain:   .  Difficulty of Paying Living Expenses:   Food Insecurity:   . Worried About Programme researcher, broadcasting/film/video in the Last Year:   . Barista in the Last Year:   Transportation Needs:   . Freight forwarder (Medical):   Marland Kitchen Lack of Transportation (Non-Medical):   Physical Activity:   . Days of Exercise per Week:   . Minutes of Exercise per Session:   Stress:   . Feeling of Stress :   Social Connections:   . Frequency of Communication with Friends and Family:   . Frequency of Social Gatherings with Friends and Family:   . Attends Religious Services:   . Active Member of Clubs or Organizations:   . Attends Banker Meetings:   Marland Kitchen Marital Status:   Intimate Partner Violence:   . Fear of  Current or Ex-Partner:   . Emotionally Abused:   Marland Kitchen Physically Abused:   . Sexually Abused:     Family History:   No family hx of premature CAD or SCD  ROS:   Review of Systems: [y] = yes, [ ]  = no       General: Weight gain [ ] ; Weight loss [ ] ; Anorexia [ ] ; Fatigue [ ] ; Fever [ ] ; Chills [ ] ; Weakness [ ]     Cardiac: Chest pain/pressure [ ] ; Resting SOB [y]; Exertional SOB [y]; Orthopnea [ ] ; Pedal Edema [y]; Palpitations [ ] ; Syncope [ ] ; Presyncope [ ] ; Paroxysmal nocturnal dyspnea [ ]     Pulmonary: Cough [y]; Wheezing [ ] ; Hemoptysis [ ] ; Sputum [ ] ; Snoring [ ]     GI: Vomiting [ ] ; Dysphagia [ ] ; Melena [ ] ; Hematochezia [ ] ; Heartburn [ ] ; Abdominal pain [ ] ; Constipation [ ] ; Diarrhea [ ] ; BRBPR [ ]     GU: Hematuria [ ] ; Dysuria [ ] ; Nocturia [ ]   Vascular: Pain in legs with walking [ ] ; Pain in feet with lying flat [ ] ; Non-healing sores [ ] ; Stroke [ ] ; TIA [ ] ; Slurred speech [ ] ;    Neuro: Headaches [ ] ; Vertigo [ ] ; Seizures [ ] ; Paresthesias [ ] ;Blurred vision [ ] ; Diplopia [ ] ; Vision changes [ ]     Ortho/Skin: Arthritis [ ] ; Joint pain [ ] ; Muscle pain [ ] ; Joint swelling [ ] ; Back Pain [ ] ; Rash [ ]     Psych: Depression [ ] ; Anxiety [ ]     Heme: Bleeding problems [ ] ; Clotting disorders [ ] ; Anemia [ ]     Endocrine: Diabetes [ ] ; Thyroid dysfunction [ ]    Physical Exam/Data:   Vitals:   11/27/19 2212 11/27/19 2300 11/28/19 0258 11/28/19 0425  BP: (!) 154/99 (!) 174/100 127/74 119/75  Pulse: (!) 108 (!) 110 (!) 105 (!) 105  Resp: 19 20 18 20   Temp:   98.2 F (36.8 C) 98.2 F (36.8 C)  TempSrc:   Oral Oral  SpO2: 90% 94% 95% 97%  Weight:    (!) 231.1 kg  Height:    6' 2.2" (1.885 m)    Intake/Output Summary (Last 24 hours) at 11/28/2019 0506 Last data filed at 11/28/2019 0400 Gross per 24 hour  Intake 0 ml  Output 1760 ml  Net -1760 ml   Last 3 Weights 11/28/2019 11/27/2019  Weight (lbs) 509 lb 7.7 oz 475 lb  Weight (kg) 231.1 kg 215.459 kg       Body mass index is 65.06 kg/m.  General:  Well nourished, well developed, in no acute distress  HEENT: normal Lymph: no adenopathy Neck: JVD unable to assess 2/2 body habitus  Endocrine:  No thryomegaly Vascular: No carotid bruits; FA pulses 2+ bilaterally without bruits  Cardiac:  normal S1, S2; RRR; no murmur  Lungs:  clear to auscultation bilaterally, no wheezing, rhonchi or rales  Abd: soft, nontender, no hepatomegaly  Ext: 3+ LE edema b/l Musculoskeletal:  No deformities, BUE and BLE strength normal and equal Skin: warm and dry  Neuro:  CNs 2-12 intact, no focal abnormalities noted Psych:  Normal affect   EKG:  The ECG that was done and demonstrates sinus tach on admission   Relevant CV Studies: none  Laboratory Data:  High Sensitivity Troponin:   Recent Labs  Lab 11/27/19 1654 11/27/19 1831  TROPONINIHS 55* 57*      Chemistry Recent Labs  Lab 11/27/19 1317  NA 130*  K 4.3  CL 92*  CO2 26  GLUCOSE 248*  BUN 18  CREATININE 0.98  CALCIUM 8.7*  GFRNONAA >60  GFRAA >60  ANIONGAP 12    No results for input(s): PROT, ALBUMIN, AST, ALT, ALKPHOS, BILITOT in the last 168 hours. Hematology Recent Labs  Lab 11/27/19 1317  WBC 10.4  RBC 5.29  HGB 15.1  HCT 47.0  MCV 88.8  MCH 28.5  MCHC 32.1  RDW 14.2  PLT 246   BNP Recent Labs  Lab 11/27/19 1317  BNP 441.1*    DDimer No results for input(s): DDIMER in the last 168 hours.  Radiology/Studies:  DG Chest 2 View  Result Date: 11/27/2019 CLINICAL DATA:  Bilateral lower extremity swelling and abdominal bloating for 6 days. EXAM: CHEST - 2 VIEW COMPARISON:  None. FINDINGS: There is cardiomegaly and mild interstitial edema. No consolidative process, pneumothorax or pleural effusion. No acute or focal bony abnormality. IMPRESSION: Cardiomegaly and mild interstitial edema. Electronically Signed   By: Drusilla Kanner M.D.   On: 11/27/2019 14:01   New York Heart Association (NYHA) Functional Class NYHA Class  III  Assessment and Plan:   3M with HTN and morbid obesity who presents with acute onset LE edema and SOB/DOE c/w newly dx HF.   1. Acute HF  Mr. Hazel has lab work and symptoms consistent with newly diagnosed heart failure exacerbation.  The 2 most likely etiologies for his heart failure or acute viral illness leading to a viral myocarditis versus heavy alcohol consumption and/or a combination of both. He has had significant alcohol use in the past several years which may partly contributed to failure.  He is not sure if he has had significant weight gain but has had noticeable worsening lower extremity edema. - continue home lisinopril 20 mg PO daily (intermittently taking) - start metop tartrate 12.5 mg PO bid - obtain TTE - lasix 40 mg IV bid - HIV, lipids, TSH, A1c  2. HTN - continue home lisinopril 20 mg PO daily  3. morbid obesity  - encourage lifestyle changes at discharge   Severity of Illness: The appropriate patient status for this patient is INPATIENT. Inpatient status is judged to be reasonable and necessary in order to provide the required intensity of service to ensure the patient's safety. The patient's presenting symptoms, physical exam findings, and initial radiographic and laboratory data in the context of their chronic comorbidities is felt to place them at high risk for further clinical deterioration. Furthermore, it is not anticipated that the patient will be medically stable for discharge from the hospital within 2 midnights of admission. The following factors support  the patient status of inpatient.   " The patient's presenting symptoms include DOE/SOB/LE edema. " The worrisome physical exam findings include LE edema " The initial radiographic and laboratory data are worrisome because of elevated BNP " The chronic co-morbidities include morbid obesity, HTN  * I certify that at the point of admission it is my clinical judgment that the patient will require  inpatient hospital care spanning beyond 2 midnights from the point of admission due to high intensity of service, high risk for further deterioration and high frequency of surveillance required.*   For questions or updates, please contact [facility] Please consult [email or web address] for contact info under   Signed, Linton Rump, MD  11/28/2019 5:06 AM

## 2019-11-29 ENCOUNTER — Inpatient Hospital Stay: Payer: Self-pay

## 2019-11-29 ENCOUNTER — Inpatient Hospital Stay (HOSPITAL_COMMUNITY): Payer: 59

## 2019-11-29 ENCOUNTER — Other Ambulatory Visit (HOSPITAL_COMMUNITY): Payer: 59

## 2019-11-29 ENCOUNTER — Encounter (HOSPITAL_COMMUNITY): Payer: Self-pay | Admitting: Student in an Organized Health Care Education/Training Program

## 2019-11-29 DIAGNOSIS — E119 Type 2 diabetes mellitus without complications: Secondary | ICD-10-CM

## 2019-11-29 DIAGNOSIS — F1010 Alcohol abuse, uncomplicated: Secondary | ICD-10-CM

## 2019-11-29 DIAGNOSIS — I5021 Acute systolic (congestive) heart failure: Secondary | ICD-10-CM

## 2019-11-29 LAB — GLUCOSE, CAPILLARY
Glucose-Capillary: 196 mg/dL — ABNORMAL HIGH (ref 70–99)
Glucose-Capillary: 214 mg/dL — ABNORMAL HIGH (ref 70–99)
Glucose-Capillary: 206 mg/dL — ABNORMAL HIGH (ref 70–99)
Glucose-Capillary: 246 mg/dL — ABNORMAL HIGH (ref 70–99)

## 2019-11-29 LAB — COMPREHENSIVE METABOLIC PANEL
ALT: 82 U/L — ABNORMAL HIGH (ref 0–44)
AST: 56 U/L — ABNORMAL HIGH (ref 15–41)
Albumin: 2.8 g/dL — ABNORMAL LOW (ref 3.5–5.0)
Alkaline Phosphatase: 86 U/L (ref 38–126)
Anion gap: 9 (ref 5–15)
BUN: 17 mg/dL (ref 6–20)
CO2: 35 mmol/L — ABNORMAL HIGH (ref 22–32)
Calcium: 8.3 mg/dL — ABNORMAL LOW (ref 8.9–10.3)
Chloride: 92 mmol/L — ABNORMAL LOW (ref 98–111)
Creatinine, Ser: 1.1 mg/dL (ref 0.61–1.24)
GFR calc Af Amer: >60 mL/min (ref >60)
GFR calc non Af Amer: >60 mL/min (ref >60)
Glucose, Bld: 207 mg/dL — ABNORMAL HIGH (ref 70–99)
Potassium: 4.5 mmol/L (ref 3.5–5.1)
Sodium: 136 mmol/L (ref 135–145)
Total Bilirubin: 2.9 mg/dL — ABNORMAL HIGH (ref 0.3–1.2)
Total Protein: 6.2 g/dL — ABNORMAL LOW (ref 6.5–8.1)

## 2019-11-29 LAB — MAGNESIUM: Magnesium: 1.5 mg/dL — ABNORMAL LOW (ref 1.7–2.4)

## 2019-11-29 MED ORDER — LORAZEPAM 1 MG PO TABS
1.0000 mg | ORAL_TABLET | ORAL | Status: DC | PRN
  Administered 2019-11-29: 2 mg via ORAL
  Filled 2019-11-29: qty 2

## 2019-11-29 MED ORDER — MAGNESIUM SULFATE 4 GM/100ML IV SOLN
4.0000 g | Freq: Once | INTRAVENOUS | Status: AC
  Administered 2019-11-29: 4 g via INTRAVENOUS
  Filled 2019-11-29: qty 100

## 2019-11-29 MED ORDER — SODIUM CHLORIDE 0.9% FLUSH: 10.0000 mL | INTRAVENOUS | Status: DC | PRN

## 2019-11-29 MED ORDER — ALTEPLASE 2 MG IJ SOLR
2.0000 mg | Freq: Once | INTRAMUSCULAR | Status: AC
  Administered 2019-11-29: 2 mg

## 2019-11-29 MED ORDER — FOLIC ACID 1 MG PO TABS
1.0000 mg | ORAL_TABLET | Freq: Every day | ORAL | Status: DC
  Administered 2019-11-29 – 2019-12-03 (×5): 1 mg via ORAL
  Filled 2019-11-29 (×5): qty 1

## 2019-11-29 MED ORDER — CHLORHEXIDINE GLUCONATE CLOTH 2 % EX PADS
6.0000 | MEDICATED_PAD | Freq: Every day | CUTANEOUS | Status: DC
  Administered 2019-11-30 – 2019-12-02 (×3): 6 via TOPICAL

## 2019-11-29 MED ORDER — MAGNESIUM SULFATE 2 GM/50ML IV SOLN
2.0000 g | Freq: Once | INTRAVENOUS | Status: DC
  Filled 2019-11-29: qty 50

## 2019-11-29 MED ORDER — THIAMINE HCL 100 MG PO TABS
100.0000 mg | ORAL_TABLET | Freq: Every day | ORAL | Status: DC
  Administered 2019-11-29 – 2019-12-03 (×5): 100 mg via ORAL
  Filled 2019-11-29 (×5): qty 1

## 2019-11-29 MED ORDER — SPIRONOLACTONE 12.5 MG HALF TABLET
12.5000 mg | ORAL_TABLET | Freq: Every day | ORAL | Status: DC
  Administered 2019-11-29 – 2019-12-01 (×3): 12.5 mg via ORAL
  Filled 2019-11-29 (×3): qty 1

## 2019-11-29 MED ORDER — GUAIFENESIN-DM 100-10 MG/5ML PO SYRP
5.0000 mL | ORAL_SOLUTION | ORAL | Status: DC | PRN
  Administered 2019-11-29 – 2019-12-01 (×7): 5 mL via ORAL
  Filled 2019-11-29 (×7): qty 5

## 2019-11-29 MED ORDER — SODIUM CHLORIDE 0.9% FLUSH
10.0000 mL | Freq: Two times a day (BID) | INTRAVENOUS | Status: DC
  Administered 2019-11-29: 20 mL
  Administered 2019-11-30 – 2019-12-02 (×5): 10 mL

## 2019-11-29 MED ORDER — LIVING WELL WITH DIABETES BOOK
Freq: Once | Status: AC
  Filled 2019-11-29: qty 1

## 2019-11-29 MED ORDER — DIGOXIN 125 MCG PO TABS
0.1250 mg | ORAL_TABLET | Freq: Every day | ORAL | Status: DC
  Administered 2019-11-29 – 2019-12-03 (×5): 0.125 mg via ORAL
  Filled 2019-11-29 (×5): qty 1

## 2019-11-29 MED ORDER — MAGNESIUM OXIDE 400 (241.3 MG) MG PO TABS
400.0000 mg | ORAL_TABLET | Freq: Every day | ORAL | Status: DC
  Administered 2019-11-30 – 2019-12-03 (×4): 400 mg via ORAL
  Filled 2019-11-29 (×4): qty 1

## 2019-11-29 NOTE — Progress Notes (Signed)
Nutrition Education Note  RD consulted for nutrition education regarding a Heart Healthy/Diabetic diet.   RD provided "Heart Healthy Nutrition Therapy" handout from the Academy of Nutrition and Dietetics. Reviewed patient's dietary recall. Provided examples on ways to decrease sodium and fat intake in diet. Discouraged intake of processed foods and use of salt shaker. Encouraged fresh fruits and vegetables as well as whole grain sources of carbohydrates to maximize fiber intake.   RD provided "Nutirtion Therapy with Type II Diabetes" handout from the Academy of Nutrition and Dietetics. Discussed different food groups and their effects on blood sugar, emphasizing carbohydrate-containing foods. Provided list of carbohydrates and recommended serving sizes of common foods.  Discussed importance of controlled and consistent carbohydrate intake throughout the day. Provided examples of ways to balance meals/snacks and encouraged intake of high-fiber, whole grain complex carbohydrates.   Teach back method used.  Expect fair compliance.  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 Eats two meals daily that consist of fast food meals. Drinks mostly diet soda, water, and vodka mixed with Sunkist. States he drinks two shots of vodka on his days off and sometimes more if it's a game day. Discussed importance of eating three meals, how to make meals balanced, how to read a food label, and how to make appropriate beverage substitutions.   Current diet order is 2 g sodium/carb modified, patient is consuming approximately 100% of meals at this time. Labs and medications reviewed. No further nutrition interventions warranted at this time. RD contact information provided. If additional nutrition issues arise, please re-consult RD.  Vanessa Kick RD, LDN Clinical Nutrition Pager listed in AMION

## 2019-11-29 NOTE — Progress Notes (Addendum)
   11/28/19 2140  Vitals  Temp 98.1 F (36.7 C)  Temp Source Oral  BP 120/72  MAP (mmHg) 87  BP Location Left Arm  BP Method Automatic  Patient Position (if appropriate) Sitting  Pulse Rate (!) 103  Pulse Rate Source Monitor  ECG Heart Rate (!) 104  Resp (!) 22  Oxygen Therapy  SpO2 93 %  O2 Device Nasal Cannula  O2 Flow Rate (L/min) 2 L/min  MEWS Score  MEWS Temp 0  MEWS Systolic 0  MEWS Pulse 1  MEWS RR 1  MEWS LOC 0  MEWS Score 2  MEWS Score Color Yellow  H.R. has been up today not new. Mainly with activity and talking. Resp. Has been up today with activity and talking. Have notice they are normal at rest. Patient states he is not short of breath with activity. Modena Morrow nurse aware. Lungs dec bases otherwise clear.

## 2019-11-29 NOTE — Progress Notes (Addendum)
Progress Note  Patient Name: Sean Lawson Date of Encounter: 11/29/2019  [facility] Cardiologist: Donato Schultz, MD   Subjective   Breathing some better, not at baseline.  Has been up and down, is restless but no tremors Cough improving No CP, palps  Inpatient Medications    Scheduled Meds: . enoxaparin (LOVENOX) injection  120 mg Subcutaneous Q24H  . furosemide  40 mg Intravenous BID  . insulin aspart  0-15 Units Subcutaneous TID WC  . metoprolol tartrate  12.5 mg Oral BID  . sodium chloride flush  3 mL Intravenous Q12H   Continuous Infusions: . sodium chloride     PRN Meds: sodium chloride, acetaminophen, guaiFENesin-dextromethorphan, ondansetron (ZOFRAN) IV, sodium chloride flush   Vital Signs    Vitals:   11/29/19 0200 11/29/19 0410 11/29/19 0431 11/29/19 0623  BP: 106/73 129/64  104/78  Pulse: 93 (!) 102 87 93  Resp: 18 (!) 21 (!) 23 17  Temp: 98 F (36.7 C) 97.7 F (36.5 C)  97.7 F (36.5 C)  TempSrc: Oral Oral  Oral  SpO2: 98% 98% 96% 99%  Weight:   (!) 227.4 kg   Height:        Intake/Output Summary (Last 24 hours) at 11/29/2019 0911 Last data filed at 11/29/2019 0416 Gross per 24 hour  Intake 393 ml  Output 2750 ml  Net -2357 ml   Last 3 Weights 11/29/2019 11/28/2019 11/27/2019  Weight (lbs) 501 lb 6.4 oz 509 lb 7.7 oz 475 lb  Weight (kg) 227.434 kg 231.1 kg 215.459 kg      Telemetry    SR, high-normal, occ ST - Personally Reviewed  ECG    No new ECG tracing today. - Personally Reviewed  Physical Exam   GEN: No acute distress.   Neck: No JVD seen, difficult to assess 2nd body habitus Cardiac: RRR, no murmur, no rubs, or gallops.  Respiratory: diminished to auscultation bilaterally with rales in the bases. GI: Soft, nontender, non-distended, sig scrotal edema MS: at least 1+ edema; No deformity. Neuro:  Nonfocal  Psych: Normal affect   Labs    High Sensitivity Troponin:   Recent Labs  Lab 11/27/19 1654 11/27/19 1831  TROPONINIHS  55* 57*      Chemistry Recent Labs  Lab 11/27/19 1317 11/28/19 0716 11/29/19 0238  NA 130* 136 136  K 4.3 4.3 4.5  CL 92* 91* 92*  CO2 26 33* 35*  GLUCOSE 248* 218* 207*  BUN 18 14 17   CREATININE 0.98 1.10 1.10  CALCIUM 8.7* 8.5* 8.3*  PROT  --   --  6.2*  ALBUMIN  --   --  2.8*  AST  --   --  56*  ALT  --   --  82*  ALKPHOS  --   --  86  BILITOT  --   --  2.9*  GFRNONAA >60 >60 >60  GFRAA >60 >60 >60  ANIONGAP 12 12 9      Hematology Recent Labs  Lab 11/27/19 1317 11/28/19 0716  WBC 10.4 10.1  RBC 5.29 5.03  HGB 15.1 14.1  HCT 47.0 45.0  MCV 88.8 89.5  MCH 28.5 28.0  MCHC 32.1 31.3  RDW 14.2 14.5  PLT 246 227    BNP Recent Labs  Lab 11/27/19 1317  BNP 441.1*    Lab Results  Component Value Date   TSH 1.976 11/28/2019   Lab Results  Component Value Date   HGBA1C 11.4 (H) 11/28/2019   Lab Results  Component Value Date   CHOL 122 11/28/2019   HDL 22 (L) 11/28/2019   LDLCALC 82 11/28/2019   TRIG 89 11/28/2019   CHOLHDL 5.5 11/28/2019   Magnesium  Date Value Ref Range Status  11/29/2019 1.5 (L) 1.7 - 2.4 mg/dL Final    Comment:    Performed at [facility], [address] [place], [place] [address]    Radiology    DG Chest 2 View  Result Date: 11/27/2019 CLINICAL DATA:  Bilateral lower extremity swelling and abdominal bloating for 6 days. EXAM: CHEST - 2 VIEW COMPARISON:  None. FINDINGS: There is cardiomegaly and mild interstitial edema. No consolidative process, pneumothorax or pleural effusion. No acute or focal bony abnormality. IMPRESSION: Cardiomegaly and mild interstitial edema. Electronically Signed   By: Drusilla Kanner M.D.   On: 11/27/2019 14:01   ECHOCARDIOGRAM COMPLETE  Result Date: 11/28/2019    ECHOCARDIOGRAM REPORT   Patient Name:   Sean Lawson Date of Exam: 11/28/2019 Medical Rec #:  [id number]       Height:       74.2 in Accession #:    [id number]      Weight:       509.5 lb Date of Birth:  03-07-1986        BSA:           3.240 m Patient Age:    34 years        BP:           113/79 mmHg Patient Gender: M               HR:           105 bpm. Exam Location:  Inpatient Procedure: 2D Echo, Cardiac Doppler, Color Doppler and Intracardiac            Opacification Agent Indications:    CHF-Acute Systolic 428.21 / I50.21  History:        Patient has no prior history of Echocardiogram examinations.                 Risk Factors:Hypertension and Non-Smoker.  Sonographer:    Renella Cunas RDCS Referring Phys: [id number] MATTHEW A CARLISLE  Sonographer Comments: Patient is morbidly obese and Technically difficult study due to poor echo windows. IMPRESSIONS  1. Left ventricular ejection fraction, by estimation, is 20 to 25%. The left ventricle has severely decreased function. Left ventricular endocardial border not optimally defined to evaluate regional wall motion. The left ventricular internal cavity size  was mildly dilated. Left ventricular diastolic parameters are indeterminate.  2. Right ventricular systolic function was not well visualized. The right ventricular size is not well visualized. Tricuspid regurgitation signal is inadequate for assessing PA pressure.  3. The mitral valve was not well visualized. No evidence of mitral valve regurgitation. No evidence of mitral stenosis.  4. The aortic valve is tricuspid. Aortic valve regurgitation is not visualized. No aortic stenosis is present.  5. Aortic dilatation noted. There is mild dilatation of the aortic root measuring 38 mm.  6. The inferior vena cava is normal in size with greater than 50% respiratory variability, suggesting right atrial pressure of 3 mmHg.  7. Very technically difficult study with poor images. FINDINGS  Left Ventricle: Left ventricular ejection fraction, by estimation, is 20 to 25%. The left ventricle has severely decreased function. Left ventricular endocardial border not optimally defined to evaluate regional wall motion. Definity contrast agent was given IV to  delineate the left ventricular  endocardial borders. The left ventricular internal cavity size was mildly dilated. There is no left ventricular hypertrophy. Left ventricular diastolic parameters are indeterminate. Right Ventricle: The right ventricular size is not well visualized. Right vetricular wall thickness was not assessed. Right ventricular systolic function was not well visualized. Tricuspid regurgitation signal is inadequate for assessing PA pressure. Left Atrium: Left atrial size was normal in size. Right Atrium: Right atrial size was not well visualized. Pericardium: There is no evidence of pericardial effusion. Mitral Valve: The mitral valve was not well visualized. No evidence of mitral valve regurgitation. No evidence of mitral valve stenosis. Tricuspid Valve: The tricuspid valve is not well visualized. Tricuspid valve regurgitation is not demonstrated. Aortic Valve: The aortic valve is tricuspid. Aortic valve regurgitation is not visualized. No aortic stenosis is present. Pulmonic Valve: The pulmonic valve was normal in structure. Pulmonic valve regurgitation is not visualized. Aorta: Aortic dilatation noted. There is mild dilatation of the aortic root measuring 38 mm. Venous: The inferior vena cava was not well visualized. The inferior vena cava is normal in size with greater than 50% respiratory variability, suggesting right atrial pressure of 3 mmHg. IAS/Shunts: The interatrial septum was not well visualized.  LEFT VENTRICLE PLAX 2D LVOT diam:     2.40 cm LV SV:         50 LV SV Index:   15 LVOT Area:     4.52 cm  LEFT ATRIUM             Index LA Vol (A2C):   75.8 ml 23.39 ml/m LA Vol (A4C):   87.1 ml 26.88 ml/m LA Biplane Vol: 90.1 ml 27.81 ml/m  AORTIC VALVE LVOT Vmax:   72.60 cm/s LVOT Vmean:  54.200 cm/s LVOT VTI:    0.110 m  AORTA Ao Root diam: 3.80 cm  SHUNTS Systemic VTI:  0.11 m Systemic Diam: 2.40 cm [name] Electronically signed by [name] Signature Date/Time:  11/28/2019/4:17:05 PM    Final     Cardiac Studies   Echo 11/28/2019 - tech difficult study 1. Left ventricular ejection fraction, by estimation, is 20 to 25%. The  left ventricle has severely decreased function. Left ventricular  endocardial border not optimally defined to evaluate regional wall motion.  The left ventricular internal cavity size  was mildly dilated. Left ventricular diastolic parameters are  indeterminate.  2. Right ventricular systolic function was not well visualized. The right  ventricular size is not well visualized. Tricuspid regurgitation signal is  inadequate for assessing PA pressure.  3. The mitral valve was not well visualized. No evidence of mitral valve  regurgitation. No evidence of mitral stenosis.  4. The aortic valve is tricuspid. Aortic valve regurgitation is not  visualized. No aortic stenosis is present.  5. Aortic dilatation noted. There is mild dilatation of the aortic root  measuring 38 mm.  6. The inferior vena cava is normal in size with greater than 50%  respiratory variability, suggesting right atrial pressure of 3 mmHg.  7. Very technically difficult study with poor images.   Patient Profile     34 y.o. male with a history of hypertension, newly diagnosed type 2 diabetes, morbid obesity, and alcohol abuse who presented 07/08 with acute onset of shortness of breath and lower extremity edema consistent with new onset CHF.   Assessment & Plan    New Onset Acute CHF - Systolic CHF, EF 32-67% by echo - good response to Lasix - wt down 8 lbs overnight, I/O  net - 5.5 L - continue IV Lasix for now - CHF team contacted, they will see - last dose lisinopril was 07/07 - started on metop 12.5 mg bid - no doses missed, SBP 100s at times - hopefully will be ready for R/L cath Monday  Hypertension - SBP ok on metoprolol, off lisinopril  Newly Diagnosed Type 2 Diabetes Mellitus - change to ADA low Na diet  - Diabetes team following -  on SSI and insulin, unclear if he will be on insulin at d/c - MD advise on starting Jardiance  Alcohol Abuse - restless, but no tremors or other signs of DTs - add thiamine and folic acid - encourage cessation as outpt - LFTs abnormal, recheck 07/11  Hypomagnesemia - Mg 1.2>>1.5 after 2 gm MgSO4 - will give another 2 gm today, recheck in am - start Mag-ox 400 mg qd, with low EF, keep Mg >2.0  Scrotal edema - [place] ordered, f/u on results  For questions or updates, please contact [facility] Please consult [email or web address] for contact info under        Signed, Theodore Demark, PA-C  11/29/2019, 9:11 AM    Personally seen and examined. Agree with above.   34 year old with newly discovered ejection fraction 20 to 25% with super morbid obesity BMI 64, approximately 500 pounds, heavy alcohol use.  Had a rough night he states.  Been urinating quite a bit.  Did have some right testicular pain, swelling.  Likely hydrocele in the setting of excessive fluid overload.  Checking ultrasound.  Magnesium has been low.  Repleting.  Continuing diuresis with IV Lasix 40 mg twice daily.  Responding well.  Holding ACE inhibitor, tomorrow morning will be 36 hours.  No signs of DTs.  New onset diabetes-insulin sliding scale currently.  We have gone ahead and consulted advanced heart failure team as well.  Spoke with he and his mother.  Obviously we will have to make significant lifestyle modifications.  He states that he is able to stop the alcohol.  Donato Schultz, MD

## 2019-11-29 NOTE — TOC Benefit Eligibility Note (Signed)
Transition of Care [facility]) Benefit Eligibility Note    Patient Details  Name: BLONG BUSK MRN: [id number] Date of Birth: 1986/03/10   Medication/Dose: Wilder Glade  10 MG DAILY  Covered?: Yes     Prescription Coverage Preferred Pharmacy: [place] with Person/Company/Phone Number:: GRACE B. @  [employer] [place] # [phone]  Co-Pay: Johnsie Kindred  Prior Approval: No  Deductible: Met       Memory Argue Phone Number: 11/29/2019, 12:18 PM

## 2019-11-29 NOTE — Progress Notes (Addendum)
Inpatient Diabetes Program Recommendations  AACE/ADA: New Consensus Statement on Inpatient Glycemic Control (2015)  Target Ranges:  Prepandial:   less than 140 mg/dL      Peak postprandial:   less than 180 mg/dL (1-2 hours)      Critically ill patients:  140 - 180 mg/dL   Lab Results  Component Value Date   GLUCAP 196 (H) 11/29/2019   HGBA1C 11.4 (H) 11/28/2019    Review of Glycemic Control  Results for [name], [name] (MRN [id number]) as of 11/29/2019 09:59  Ref. Range 11/28/2019 11:39 11/28/2019 13:25 11/28/2019 16:24 11/28/2019 21:17 11/29/2019 06:10  Glucose-Capillary Latest Ref Range: 70 - 99 mg/dL 332 (H) 951 (H) 884 (H) 188 (H) 196 (H)   Diabetes history: New diagnosis this admission A1c 11.4%  Current orders for Inpatient glycemic control:  Novolog 0-15 units tid  Inpatient Diabetes Program Recommendations:    - Add Glipizide 5 mg bid  - Order ambulatory referral for outpatient diabetes education. - Order referral to Dr. Quillian Quince  At [facility]  [address] [place], [place] [address]  [phone]  Based on current glucose trends with only Novolog on board pt may benefit from a low dose SGLT-2 inhibitor (does have risk of genitourinary issues, will need to make sure pt is not at risk for these), may can avoid insulin at this time.  Thanks,  Christena Deem RN, MSN, BC-ADM Inpatient Diabetes Coordinator Team Pager [phone] (8a-5p)  Spoke with pt again today regarding questions about DM diagnosis and how to manage his glucose at home. Discussed with him nutrition education outpatient and possibly going to the healthy weight and wellness clinic to see Dr. Dalbert Garnet. Pt does not have any questions at this time. Pt having difficulty watching DM videos. I think our video system is down. I have discussed all information with pt that is contained within the DM videos.

## 2019-11-29 NOTE — Discharge Instructions (Signed)
outpatient diabetes education Consider seeing Dr. Quillian Quince  At [facility]             [address] [place], [place] [address]             [phone]

## 2019-11-29 NOTE — Progress Notes (Signed)
Patient has been given list of Diabetic video's and and CHF video to watch. Enc. Him to read the books on Diabetes.

## 2019-11-29 NOTE — Consult Note (Addendum)
Advanced Heart Failure Team Consult Note   Primary Physician: [name], MD PCP-Cardiologist:  Donato Schultz, MD  Reason for Consultation: Acute Systolic HF   HPI:    Sean Lawson is seen today for evaluation of Acute Systolic Heart Failure at the request of Dr Anne Fu.   Mr Bossi is a 34 year old with a history of HTN, obesity, and ETOH abuse. Says he was borderline for diabetes. Drinks heavily, vodka when he is not working. Denies drug use.    Over the last 2 week he started coughing and had shortness of breath with exertion. Denies chest pain.  Says he has been told he snores. Falls a sleep easily. Takes all meds. 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    Presented to [facility] with in increased shortness of breath. CXR with cardiomegaly and interstitial edema. Started on IV lasix. ECHO completed and showed EF 20-25%, RV not well visualized.  Pertinent admission labs: Hgb A1C 11.4, HS Trop 55>57, BNP 441, HIV NR. Brisk diuresis noted. Weight down 3 pounds but negative 6.3 liters.   Review of Systems: [y] = yes,  = no   . General: Weight gain [ Y]; Weight loss ; Anorexia ; Fatigue [Y]; Fever ; Chills ; Weakness [Y ]  . Cardiac: Chest pain/pressure ; Resting SOB ; Exertional SOB [Y]; Orthopnea [Y ]; Pedal Edema [ Y]; Palpitations ; Syncope ; Presyncope ; Paroxysmal nocturnal dyspnea[ ]   . Pulmonary: Cough [Y ]; Wheezing[ ] ; Hemoptysis[ ] ; Sputum ; Snoring [Y ]  . GI: Vomiting[ ] ; Dysphagia[ ] ; Melena[ ] ; Hematochezia ; Heartburn[ ] ; Abdominal pain ; Constipation ; Diarrhea ; BRBPR   . GU: Hematuria[ ] ; Dysuria ; Nocturia[ ]   . Vascular: Pain in legs with walking ; Pain in feet with lying flat ; Non-healing sores ; Stroke ; TIA ; Slurred speech ;  . Neuro: Headaches[ ] ; Vertigo[ ] ; Seizures[ ] ; Paresthesias[ ] ;Blurred vision ; Diplopia ; Vision changes   . Ortho/Skin: Arthritis ; Joint pain [Y ]; Muscle pain ; Joint  swelling ; Back Pain [Y ]; Rash   . Psych: Depression[ ] ; Anxiety[ ]   . Heme: Bleeding problems ; Clotting disorders ; Anemia   . Endocrine: Diabetes  Thyroid dysfunction[ ]   Home Medications Prior to Admission medications   Medication Sig Start Date End Date Taking? Authorizing Provider  acetaminophen (TYLENOL) 500 MG tablet Take 1,000 mg by mouth every 6 (six) hours as needed for mild pain.   Yes [provider]  amLODipine (NORVASC) 10 MG tablet Take 10 mg by mouth daily.    Yes [provider]  lisinopril (ZESTRIL) 10 MG tablet Take by mouth.   Yes [provider]  Potassium 95 MG TABS Take 95 mg by mouth daily. Took only for 2-3 days   Yes [provider]    Past Medical History: Past Medical History:  Diagnosis Date  . Hypertension     Past Surgical History: History reviewed. No pertinent surgical history.  Family History: Family History  Problem Relation Age of Onset  . Diabetes Mellitus II Mother     Social History: Social History   Socioeconomic History  . Marital status: Single    Spouse name: Not on file  . Number of children: 0  . Years of education: Not on  file  . Highest education level: Master's degree (e.g., MA, MS, MEng, MEd, MSW, MBA)  Occupational History  . 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 Smoking status: Never Smoker  . Smokeless tobacco: Former Neurosurgeon    Types: Snuff  Vaping Use  . Vaping Use: Never used  Substance and Sexual Activity  . Alcohol use: Yes    Alcohol/week: 30.0 standard drinks    Types: 30 Shots of liquor per week    Comment: weekly  . Drug use: Never  . Sexual activity: Not Currently    Birth control/protection: None  Other Topics Concern  . Not on file  Social History Narrative  . Not on file   Social Determinants of Health   Financial Resource Strain:   . Difficulty of Paying Living Expenses:   Food Insecurity:   . Worried About Programme researcher, broadcasting/film/video in the  Last Year:   . Barista in the Last Year:   Transportation Needs:   . Freight forwarder (Medical):   Marland Kitchen Lack of Transportation (Non-Medical):   Physical Activity:   . Days of Exercise per Week:   . Minutes of Exercise per Session:   Stress:   . Feeling of Stress :   Social Connections:   . Frequency of Communication with Friends and Family:   . Frequency of Social Gatherings with Friends and Family:   . Attends Religious Services:   . Active Member of Clubs or Organizations:   . Attends Banker Meetings:   Marland Kitchen Marital Status:     Allergies:  No Known Allergies  Objective:    Vital Signs:   Temp:  [97.7 F (36.5 C)-98.6 F (37 C)] 98.6 F (37 C) (07/09 1036) Pulse Rate:  [87-104] 93 (07/09 0623) Resp:  [17-27] 17 (07/09 0623) BP: (100-129)/(64-78) 104/78 (07/09 0623) SpO2:  [92 %-99 %] 99 % (07/09 0623) Weight:  [227.4 kg] 227.4 kg (07/09 0431) Last BM Date: 11/28/19  Weight change: Filed Weights   11/27/19 1251 11/28/19 0425 11/29/19 0431  Weight: (!) 215.5 kg (!) 231.1 kg (!) 227.4 kg    Intake/Output:   Intake/Output Summary (Last 24 hours) at 11/29/2019 1044 Last data filed at 11/29/2019 1037 Gross per 24 hour  Intake 360 ml  Output 3050 ml  Net -2690 ml      Physical Exam    General:  No resp difficulty HEENT: normal Neck: supple. JVP difficult to assess due to body habitus.  Carotids 2+ bilat; no bruits. No lymphadenopathy or thyromegaly appreciated. Cor: PMI nondisplaced. Tachy Regular rate & rhythm. No rubs, or murmurs. + S3  Lungs: clear Abdomen: obese, soft, nontender, nondistended. No hepatosplenomegaly. No bruits or masses. Good bowel sounds. Extremities: no cyanosis, clubbing, rash, Rand LLE 2+  edema Neuro: alert & orientedx3, cranial nerves grossly intact. moves all 4 extremities w/o difficulty. Affect pleasant   Telemetry   Sinus Tach 100s   EKG    Sinus Tach 111 bpm   Labs   Basic Metabolic Panel: Recent  Labs  Lab 11/27/19 1317 11/28/19 0716 11/29/19 0238  NA 130* 136 136  K 4.3 4.3 4.5  CL 92* 91* 92*  CO2 26 33* 35*  GLUCOSE 248* 218* 207*  BUN 18 14 17   CREATININE 0.98 1.10 1.10  CALCIUM 8.7* 8.5* 8.3*  MG  --  1.2* 1.5*    Liver Function Tests: Recent Labs  Lab 11/29/19 0238  AST 56*  ALT 82*  ALKPHOS 86  BILITOT 2.9*  PROT 6.2*  ALBUMIN 2.8*   No results for input(s): LIPASE, AMYLASE in the last 168 hours. No results for input(s): AMMONIA in the last 168 hours.  CBC: Recent Labs  Lab 11/27/19 1317 11/28/19 0716  WBC 10.4 10.1  HGB 15.1 14.1  HCT 47.0 45.0  MCV 88.8 89.5  PLT 246 227    Cardiac Enzymes: No results for input(s): CKTOTAL, CKMB, CKMBINDEX, TROPONINI in the last 168 hours.  BNP: BNP (last 3 results) Recent Labs    11/27/19 1317  BNP 441.1*    ProBNP (last 3 results) No results for input(s): PROBNP in the last 8760 hours.   CBG: Recent Labs  Lab 11/28/19 1139 11/28/19 1325 11/28/19 1624 11/28/19 2117 11/29/19 0610  GLUCAP 212* 239* 181* 188* 196*    Coagulation Studies: Recent Labs    11/28/19 0716  LABPROT 16.6*  INR 1.4*     Imaging   ECHOCARDIOGRAM COMPLETE  Result Date: 11/28/2019    ECHOCARDIOGRAM REPORT   Patient Name:   BAYLOR CORTEZ Manolis Date of Exam: 11/28/2019 Medical Rec #:  [id number]       Height:       74.2 in Accession #:    [id number]      Weight:       509.5 lb Date of Birth:  1985/07/02        BSA:          3.240 m Patient Age:    34 years        BP:           113/79 mmHg Patient Gender: M               HR:           105 bpm. Exam Location:  Inpatient Procedure: 2D Echo, Cardiac Doppler, Color Doppler and Intracardiac            Opacification Agent Indications:    CHF-Acute Systolic 428.21 / I50.21  History:        Patient has no prior history of Echocardiogram examinations.                 Risk Factors:Hypertension and Non-Smoker.  Sonographer:    Renella Cunas RDCS Referring Phys: [id number] MATTHEW A CARLISLE   Sonographer Comments: Patient is morbidly obese and Technically difficult study due to poor echo windows. IMPRESSIONS  1. Left ventricular ejection fraction, by estimation, is 20 to 25%. The left ventricle has severely decreased function. Left ventricular endocardial border not optimally defined to evaluate regional wall motion. The left ventricular internal cavity size  was mildly dilated. Left ventricular diastolic parameters are indeterminate.  2. Right ventricular systolic function was not well visualized. The right ventricular size is not well visualized. Tricuspid regurgitation signal is inadequate for assessing PA pressure.  3. The mitral valve was not well visualized. No evidence of mitral valve regurgitation. No evidence of mitral stenosis.  4. The aortic valve is tricuspid. Aortic valve regurgitation is not visualized. No aortic stenosis is present.  5. Aortic dilatation noted. There is mild dilatation of the aortic root measuring 38 mm.  6. The inferior vena cava is normal in size with greater than 50% respiratory variability, suggesting right atrial pressure of 3 mmHg.  7. Very technically difficult study with poor images. FINDINGS  Left Ventricle: Left ventricular ejection fraction, by estimation, is 20 to 25%. The left ventricle has severely decreased function. Left ventricular endocardial border not optimally defined to evaluate  regional wall motion. Definity contrast agent was given IV to delineate the left ventricular endocardial borders. The left ventricular internal cavity size was mildly dilated. There is no left ventricular hypertrophy. Left ventricular diastolic parameters are indeterminate. Right Ventricle: The right ventricular size is not well visualized. Right vetricular wall thickness was not assessed. Right ventricular systolic function was not well visualized. Tricuspid regurgitation signal is inadequate for assessing PA pressure. Left Atrium: Left atrial size was normal in size. Right  Atrium: Right atrial size was not well visualized. Pericardium: There is no evidence of pericardial effusion. Mitral Valve: The mitral valve was not well visualized. No evidence of mitral valve regurgitation. No evidence of mitral valve stenosis. Tricuspid Valve: The tricuspid valve is not well visualized. Tricuspid valve regurgitation is not demonstrated. Aortic Valve: The aortic valve is tricuspid. Aortic valve regurgitation is not visualized. No aortic stenosis is present. Pulmonic Valve: The pulmonic valve was normal in structure. Pulmonic valve regurgitation is not visualized. Aorta: Aortic dilatation noted. There is mild dilatation of the aortic root measuring 38 mm. Venous: The inferior vena cava was not well visualized. The inferior vena cava is normal in size with greater than 50% respiratory variability, suggesting right atrial pressure of 3 mmHg. IAS/Shunts: The interatrial septum was not well visualized.  LEFT VENTRICLE PLAX 2D LVOT diam:     2.40 cm LV SV:         50 LV SV Index:   15 LVOT Area:     4.52 cm  LEFT ATRIUM             Index LA Vol (A2C):   75.8 ml 23.39 ml/m LA Vol (A4C):   87.1 ml 26.88 ml/m LA Biplane Vol: 90.1 ml 27.81 ml/m  AORTIC VALVE LVOT Vmax:   72.60 cm/s LVOT Vmean:  54.200 cm/s LVOT VTI:    0.110 m  AORTA Ao Root diam: 3.80 cm  SHUNTS Systemic VTI:  0.11 m Systemic Diam: 2.40 cm [name] Electronically signed by [name] Signature Date/Time: 11/28/2019/4:17:05 PM    Final       Medications:     Current Medications: . enoxaparin (LOVENOX) injection  120 mg Subcutaneous Q24H  . folic acid  1 mg Oral Daily  . furosemide  40 mg Intravenous BID  . insulin aspart  0-15 Units Subcutaneous TID WC  . magnesium oxide  400 mg Oral Daily  . metoprolol tartrate  12.5 mg Oral BID  . sodium chloride flush  3 mL Intravenous Q12H  . thiamine  100 mg Oral Daily     Infusions: . sodium chloride    . magnesium sulfate bolus IVPB         Assessment/Plan     1. Acute Systolic HF ECHO EF 20-25% RV ok. It is possible this is related to alcohol abuse + HTN.  TSH ok, HiV NR.  Will need cath once diuresed. Will set up for Monday.  - I am going order PICC to guide diuresis and check CO-OX. I am concerned he may have low output heart failure with tachycardia.  - Continue to diurese with IV lasix.  - Hold BB for now.  - Add digoxin 0.125 mg daily  - Add 12.5 mg spironolactone daily  - Renal function stable. Follow  - Consult cardiac rehab.    2. Newly diagnosed DMII, uncontrolled -Hgb A1C 11 - Consult diabetes coordinator. - Continue SSI.   3. Hypomagnesia Replacing mag.   4. Suspected Sleep Apnea Will  need sleep study   5. Obesity  Body mass index is 64.03 kg/m.  6. ETOH Abuse Drinks heavily when he is not working.  Discussed cessation   7. HTN Stable. Anticipate adding entresto.      Length of Stay: 1  Amy Clegg, NP  11/29/2019, 10:44 AM  Advanced Heart Failure Team Pager [phone] (M-F; 7a - 4p)  Please contact [facility] for night-coverage after hours (4p -7a ) and weekends on [email or web address]  Patient seen with NP, agree with the above note.   He reports exertional dyspnea and peripheral edema x 2 weeks.  He was admitted through the ER and noted to be volume overloaded.  He has diuresed well so far with Lasix 40 mg IV bid.    I reviewed his echo.  It is a difficult study due to body habitus.  LV EF appears to be in the 20-25% range on contrast images.   General: NAD, obese.  Neck: JVP 14 cm with thick neck, no thyromegaly or thyroid nodule.  Lungs: Clear to auscultation bilaterally with normal respiratory effort. CV: Nondisplaced PMI.  Heart regular S1/S2, no S3/S4, no murmur.  1+ edema to thighs.  No carotid bruit.  Normal pedal pulses. Abdomen: Soft, nontender, no hepatosplenomegaly, no distention.  Skin: Intact without lesions or rashes.  Neurologic: Alert and oriented x 3.  Psych: Normal affect. Extremities: No  clubbing or cyanosis.  HEENT: Normal.   1. Acute systolic CHF: Echo was difficult but EF appears to be 20-25%.  Symptoms for at least 2 wks, no definite trigger.  No chest pain.  Cause is uncertain.  However, heavy ETOH may play a role as well as HTN and untreated diabetes.  Cannot rule out viral myocarditis.  Cannot rule out coronary disease as he has risk factors.  ECG is narrow complex so not CRT candidate.  On exam, he is still volume overloaded.   Sinus tachycardia is concerning for possible low cardiac output.  - Lasix 40 mg IV bid, he is responding to this well so far.  - Place PICC, followup CVP and co-ox over weekend (exam is difficult).  - Start digoxin 0.125 daily.  - Start spironolactone 12.5 daily.  - Entresto to start if BP stays stable.  - RHC/LHC on Monday after further diuresis.  We discussed risks/benefits and he agrees to procedure.  2. Type 2 diabetes: New diagnosis with hgbA1c 11.  Per primary team.  3. Suspect OSA: Outpatient sleep study.  4. ETOH abuse: Heavy drinker when not working.  We discussed cutting this out, he feels like he can.   Marca Ancona 11/29/2019 4:51 PM

## 2019-11-30 DIAGNOSIS — I5043 Acute on chronic combined systolic (congestive) and diastolic (congestive) heart failure: Secondary | ICD-10-CM

## 2019-11-30 LAB — BASIC METABOLIC PANEL
Anion gap: 12 (ref 5–15)
BUN: 15 mg/dL (ref 6–20)
CO2: 34 mmol/L — ABNORMAL HIGH (ref 22–32)
Calcium: 7.8 mg/dL — ABNORMAL LOW (ref 8.9–10.3)
Chloride: 90 mmol/L — ABNORMAL LOW (ref 98–111)
Creatinine, Ser: 0.82 mg/dL (ref 0.61–1.24)
GFR calc Af Amer: >60 mL/min (ref >60)
GFR calc non Af Amer: >60 mL/min (ref >60)
Glucose, Bld: 196 mg/dL — ABNORMAL HIGH (ref 70–99)
Potassium: 3.9 mmol/L (ref 3.5–5.1)
Sodium: 136 mmol/L (ref 135–145)

## 2019-11-30 LAB — COOXEMETRY PANEL
Carboxyhemoglobin: 1.8 % — ABNORMAL HIGH (ref 0.5–1.5)
Methemoglobin: 0.5 % (ref 0.0–1.5)
O2 Saturation: 75.2 %
Total hemoglobin: 13.3 g/dL (ref 12.0–16.0)

## 2019-11-30 LAB — GLUCOSE, CAPILLARY
Glucose-Capillary: 193 mg/dL — ABNORMAL HIGH (ref 70–99)
Glucose-Capillary: 274 mg/dL — ABNORMAL HIGH (ref 70–99)
Glucose-Capillary: 305 mg/dL — ABNORMAL HIGH (ref 70–99)
Glucose-Capillary: 196 mg/dL — ABNORMAL HIGH (ref 70–99)

## 2019-11-30 LAB — MAGNESIUM: Magnesium: 1.6 mg/dL — ABNORMAL LOW (ref 1.7–2.4)

## 2019-11-30 MED ORDER — FUROSEMIDE 10 MG/ML IJ SOLN
80.0000 mg | Freq: Two times a day (BID) | INTRAMUSCULAR | Status: DC
  Administered 2019-11-30 – 2019-12-02 (×4): 80 mg via INTRAVENOUS
  Filled 2019-11-30 (×4): qty 8

## 2019-11-30 MED ORDER — ENOXAPARIN SODIUM 120 MG/0.8ML ~~LOC~~ SOLN
115.0000 mg | SUBCUTANEOUS | Status: DC
  Administered 2019-11-30: 115 mg via SUBCUTANEOUS
  Filled 2019-11-30: qty 0.76

## 2019-11-30 MED ORDER — SACUBITRIL-VALSARTAN 24-26 MG PO TABS
1.0000 | ORAL_TABLET | Freq: Two times a day (BID) | ORAL | Status: DC
  Administered 2019-11-30 – 2019-12-02 (×5): 1 via ORAL
  Filled 2019-11-30 (×5): qty 1

## 2019-11-30 MED ORDER — MAGNESIUM SULFATE 50 % IJ SOLN
3.0000 g | Freq: Once | INTRAVENOUS | Status: AC
  Administered 2019-11-30: 3 g via INTRAVENOUS
  Filled 2019-11-30: qty 6

## 2019-11-30 MED ORDER — ENOXAPARIN SODIUM 120 MG/0.8ML ~~LOC~~ SOLN: 115.0000 mg | SUBCUTANEOUS | Status: DC

## 2019-11-30 MED ORDER — POTASSIUM CHLORIDE CRYS ER 20 MEQ PO TBCR
40.0000 meq | EXTENDED_RELEASE_TABLET | Freq: Once | ORAL | Status: AC
  Administered 2019-11-30: 40 meq via ORAL
  Filled 2019-11-30: qty 2

## 2019-11-30 NOTE — Progress Notes (Signed)
Changed CVP set-up due to inability to read. New reading  CVP= 21. Thomas Hoff, RN

## 2019-11-30 NOTE — Progress Notes (Signed)
Patient ID: Sean Lawson, male   DOB: 09-Sep-1985, 34 y.o.   MRN: [id number]     Advanced Heart Failure Rounding Note  PCP-Cardiologist: Donato Schultz, MD   Subjective:    Patient diuresed with IV Lasix yesterday, weight down 3 lbs.  CVP 13-14 last night, not working this morning.  Co-ox good at 75%.    Objective:   Weight Range: (!) 226.2 kg Body mass index is 63.68 kg/m.   Vital Signs:   Temp:  [97.8 F (36.6 C)-98.8 F (37.1 C)] 98.3 F (36.8 C) (07/10 0518) Pulse Rate:  [95-106] 106 (07/10 1020) Resp:  [17-24] 20 (07/10 1020) BP: (110-137)/(67-98) 128/87 (07/10 1020) SpO2:  [95 %-100 %] 100 % (07/10 1020) Weight:  [226.2 kg] 226.2 kg (07/10 0500) Last BM Date: 11/29/19  Weight change: Filed Weights   11/28/19 0425 11/29/19 0431 11/30/19 0500  Weight: (!) 231.1 kg (!) 227.4 kg (!) 226.2 kg    Intake/Output:   Intake/Output Summary (Last 24 hours) at 11/30/2019 1130 Last data filed at 11/30/2019 1049 Gross per 24 hour  Intake 1050 ml  Output 2125 ml  Net -1075 ml      Physical Exam    General:  Well appearing. No resp difficulty HEENT: Normal Neck: Thick. JVP 12+. Carotids 2+ bilat; no bruits. No lymphadenopathy or thyromegaly appreciated. Cor: PMI nondisplaced. Regular rate & rhythm. No rubs, gallops or murmurs. Lungs: Clear Abdomen: Soft, nontender, nondistended. No hepatosplenomegaly. No bruits or masses. Good bowel sounds. Extremities: No cyanosis, clubbing, rash. 1+ edema to thighs.  Neuro: Alert & orientedx3, cranial nerves grossly intact. moves all 4 extremities w/o difficulty. Affect pleasant   Telemetry   NSR 90s-100s (personally reviewed)  Labs    CBC Recent Labs    11/27/19 1317 11/28/19 0716  WBC 10.4 10.1  HGB 15.1 14.1  HCT 47.0 45.0  MCV 88.8 89.5  PLT 246 227   Basic Metabolic Panel Recent Labs    65/78/46 0238 11/30/19 0257  NA 136 136  K 4.5 3.9  CL 92* 90*  CO2 35* 34*  GLUCOSE 207* 196*  BUN 17 15  CREATININE 1.10  0.82  CALCIUM 8.3* 7.8*  MG 1.5* 1.6*   Liver Function Tests Recent Labs    11/29/19 0238  AST 56*  ALT 82*  ALKPHOS 86  BILITOT 2.9*  PROT 6.2*  ALBUMIN 2.8*   No results for input(s): LIPASE, AMYLASE in the last 72 hours. Cardiac Enzymes No results for input(s): CKTOTAL, CKMB, CKMBINDEX, TROPONINI in the last 72 hours.  BNP: BNP (last 3 results) Recent Labs    11/27/19 1317  BNP 441.1*    ProBNP (last 3 results) No results for input(s): PROBNP in the last 8760 hours.   D-Dimer No results for input(s): DDIMER in the last 72 hours. Hemoglobin A1C Recent Labs    11/28/19 0716  HGBA1C 11.4*   Fasting Lipid Panel Recent Labs    11/28/19 0716  CHOL 122  HDL 22*  LDLCALC 82  TRIG 89  CHOLHDL 5.5   Thyroid Function Tests Recent Labs    11/28/19 0716  TSH 1.976    Other results:   Imaging    US SCROTUM W/DOPPLER  Result Date: 11/29/2019 CLINICAL DATA:  Scrotal edema for 5 days, RIGHT takes obscured pain for 1 day EXAM: SCROTAL ULTRASOUND DOPPLER ULTRASOUND OF THE TESTICLES TECHNIQUE: Complete ultrasound examination of the testicles, epididymis, and other scrotal structures was performed. Color and spectral Doppler ultrasound were also utilized to  evaluate blood flow to the testicles. COMPARISON:  None FINDINGS: Right testicle Measurements: 3.8 x 2.2 x 2.4 cm. Normal echogenicity without mass or calcification. Internal blood flow present on color Doppler imaging. Left testicle Measurements: 3.7 x 2.5 x 2.4 cm. Normal echogenicity without mass or calcification. Internal blood flow present on color Doppler imaging. Right epididymis:  Normal in size and appearance. Left epididymis:  Normal in size and appearance. Hydrocele:  Small BILATERAL hydroceles Varicocele:  None visualized. Pulsed Doppler interrogation of both testes demonstrates normal low resistance arterial and venous waveforms bilaterally. Marked scrotal swelling bilaterally. Scrotal wall measures in  excess of 4 cm thick. Striations of fluid/edema diffusely throughout the scrotal wall. Few scattered areas of linear increased nonshadowing echogenicity are identified within the scrotal wall, likely fat planes. No shadowing echogenic foci are seen to suggest calcification or gas. IMPRESSION: Normal testes and epididymi. Marked scrotal wall thickening/edema. No definite echogenic or shadowing foci are identified to suggest gas/Fournier's gangrene though if this is a clinical concern recommend CT. Findings called to Dr. Anne Fu on 11/29/2019 at 1522 hours. Electronically Signed   By: Ulyses Southward M.D.   On: 11/29/2019 15:24      Medications:     Scheduled Medications: . Chlorhexidine Gluconate Cloth  6 each Topical Daily  . digoxin  0.125 mg Oral Daily  . enoxaparin (LOVENOX) injection  120 mg Subcutaneous Q24H  . folic acid  1 mg Oral Daily  . furosemide  80 mg Intravenous BID  . insulin aspart  0-15 Units Subcutaneous TID WC  . magnesium oxide  400 mg Oral Daily  . potassium chloride  40 mEq Oral Once  . sacubitril-valsartan  1 tablet Oral BID  . sodium chloride flush  10-40 mL Intracatheter Q12H  . sodium chloride flush  3 mL Intravenous Q12H  . spironolactone  12.5 mg Oral Daily  . thiamine  100 mg Oral Daily     Infusions: . sodium chloride    . magnesium sulfate bolus IVPB       PRN Medications:  sodium chloride, acetaminophen, guaiFENesin-dextromethorphan, LORazepam, ondansetron (ZOFRAN) IV, sodium chloride flush, sodium chloride flush   Assessment/Plan   1. Acute systolic CHF: Echo was difficult but EF appears to be 20-25%.  Symptoms for at least 2 wks, no definite trigger.  No chest pain.  Cause is uncertain.  However, heavy ETOH may play a role as well as HTN and untreated diabetes.  Cannot rule out viral myocarditis.  Cannot rule out coronary disease as he has risk factors.  ECG is narrow complex so not CRT candidate.  On exam, he is still volume overloaded.   Sinus  tachycardia has been concerning for possible low cardiac output but co-ox 75% this morning.  CVP line not working this morning, was 13-14 last night.  - Increase Lasix to 80 mg IV bid, replace K and Mg.  - Nursing to troubleshoot PICC.  - Continue digoxin 0.125 daily.  - Continue spironolactone 12.5 daily.  - Start Entresto 24/26 bid.   - RHC/LHC on Monday after further diuresis.  We discussed risks/benefits and he agrees to procedure.  2. Type 2 diabetes: New diagnosis with hgbA1c 11.  Per primary team.  3. Suspect OSA: Outpatient sleep study.  4. ETOH abuse: Heavy drinker when not working.  We discussed cutting this out, he feels like he can.   Length of Stay: 2  Marca Ancona, MD  11/30/2019, 11:30 AM  Advanced Heart Failure Team Pager [phone] (M-F; 7a -  4p)  Please contact [facility] for night-coverage after hours (4p -7a ) and weekends on [email or web address]

## 2019-11-30 NOTE — Progress Notes (Signed)
Multiple attempts to get CVP on patient. Will have charge RN Kathlene November assist when patient in bed and ready. Thomas Hoff, RN

## 2019-11-30 NOTE — Progress Notes (Signed)
CARDIAC REHAB PHASE I   PRE:  Rate/Rhythm: 104 ST    BP: sitting 133/95    SaO2: 100 2L  MODE:  Ambulation: 470 ft   POST:  Rate/Rhythm: 118 ST Max rate during ambulation, 108 recovery post walk    BP: sitting 145/90     SaO2: 98 2L  [id number] Patient ambulated independently in hallway with slow stead gait. RN assist with O2 and IV pole. C/O mild leg weakness and shortness of breath. Offered sitting and standing rest breaks however patient declined. VSS during ambulation with lowest sat 97%. Post ambulation patient assisted to bedside sitting. RN assisted with Breakfast set up. Call bell and phone in reach. Patient encouraged to elevate scrotum to decrease discomfort and edema. Encouraged to ambulate as tolerated. Will follow up on Monday.  Sean Sebree Hoover Brunette RN, BSN

## 2019-12-01 LAB — BASIC METABOLIC PANEL
Anion gap: 11 (ref 5–15)
BUN: 14 mg/dL (ref 6–20)
CO2: 37 mmol/L — ABNORMAL HIGH (ref 22–32)
Calcium: 8.1 mg/dL — ABNORMAL LOW (ref 8.9–10.3)
Chloride: 87 mmol/L — ABNORMAL LOW (ref 98–111)
Creatinine, Ser: 0.92 mg/dL (ref 0.61–1.24)
GFR calc Af Amer: >60 mL/min (ref >60)
GFR calc non Af Amer: >60 mL/min (ref >60)
Glucose, Bld: 270 mg/dL — ABNORMAL HIGH (ref 70–99)
Potassium: 4.1 mmol/L (ref 3.5–5.1)
Sodium: 135 mmol/L (ref 135–145)

## 2019-12-01 LAB — HEPATIC FUNCTION PANEL
ALT: 59 U/L — ABNORMAL HIGH (ref 0–44)
AST: 40 U/L (ref 15–41)
Albumin: 2.7 g/dL — ABNORMAL LOW (ref 3.5–5.0)
Alkaline Phosphatase: 100 U/L (ref 38–126)
Bilirubin, Direct: 0.9 mg/dL — ABNORMAL HIGH (ref 0.0–0.2)
Indirect Bilirubin: 1.4 mg/dL — ABNORMAL HIGH (ref 0.3–0.9)
Total Bilirubin: 2.3 mg/dL — ABNORMAL HIGH (ref 0.3–1.2)
Total Protein: 6.8 g/dL (ref 6.5–8.1)

## 2019-12-01 LAB — COOXEMETRY PANEL
Carboxyhemoglobin: 2.5 % — ABNORMAL HIGH (ref 0.5–1.5)
Methemoglobin: 0.8 % (ref 0.0–1.5)
O2 Saturation: 78.2 %
Total hemoglobin: 13.8 g/dL (ref 12.0–16.0)

## 2019-12-01 LAB — GLUCOSE, CAPILLARY
Glucose-Capillary: 147 mg/dL — ABNORMAL HIGH (ref 70–99)
Glucose-Capillary: 238 mg/dL — ABNORMAL HIGH (ref 70–99)
Glucose-Capillary: 210 mg/dL — ABNORMAL HIGH (ref 70–99)
Glucose-Capillary: 218 mg/dL — ABNORMAL HIGH (ref 70–99)

## 2019-12-01 LAB — MAGNESIUM: Magnesium: 1.6 mg/dL — ABNORMAL LOW (ref 1.7–2.4)

## 2019-12-01 MED ORDER — CARVEDILOL 3.125 MG PO TABS
3.1250 mg | ORAL_TABLET | Freq: Two times a day (BID) | ORAL | Status: DC
  Administered 2019-12-01 – 2019-12-03 (×4): 3.125 mg via ORAL
  Filled 2019-12-01 (×4): qty 1

## 2019-12-01 MED ORDER — ENOXAPARIN SODIUM 120 MG/0.8ML ~~LOC~~ SOLN
110.0000 mg | SUBCUTANEOUS | Status: DC
  Administered 2019-12-01: 110 mg via SUBCUTANEOUS
  Filled 2019-12-01 (×3): qty 0.74

## 2019-12-01 MED ORDER — SODIUM CHLORIDE 0.9% FLUSH
3.0000 mL | Freq: Two times a day (BID) | INTRAVENOUS | Status: DC
  Administered 2019-12-01 – 2019-12-02 (×3): 3 mL via INTRAVENOUS

## 2019-12-01 MED ORDER — SODIUM CHLORIDE 0.9 % IV SOLN: INTRAVENOUS | Status: DC

## 2019-12-01 MED ORDER — SPIRONOLACTONE 25 MG PO TABS
25.0000 mg | ORAL_TABLET | Freq: Every day | ORAL | Status: DC
  Administered 2019-12-02 – 2019-12-03 (×2): 25 mg via ORAL
  Filled 2019-12-01 (×2): qty 1

## 2019-12-01 MED ORDER — SODIUM CHLORIDE 0.9% FLUSH: 3.0000 mL | INTRAVENOUS | Status: DC | PRN

## 2019-12-01 MED ORDER — SODIUM CHLORIDE 0.9 % IV SOLN: 250.0000 mL | INTRAVENOUS | Status: DC | PRN

## 2019-12-01 MED ORDER — MAGNESIUM SULFATE 2 GM/50ML IV SOLN
2.0000 g | Freq: Once | INTRAVENOUS | Status: AC
  Administered 2019-12-01: 2 g via INTRAVENOUS
  Filled 2019-12-01: qty 50

## 2019-12-01 MED ORDER — ASPIRIN 81 MG PO CHEW
81.0000 mg | CHEWABLE_TABLET | ORAL | Status: AC
  Administered 2019-12-02: 81 mg via ORAL
  Filled 2019-12-01: qty 1

## 2019-12-01 MED ORDER — SPIRONOLACTONE 12.5 MG HALF TABLET
12.5000 mg | ORAL_TABLET | Freq: Once | ORAL | Status: AC
  Administered 2019-12-01: 12.5 mg via ORAL
  Filled 2019-12-01: qty 1

## 2019-12-01 NOTE — Progress Notes (Signed)
Patient ID: Sean Lawson, male   DOB: 01/23/1986, 34 y.o.   MRN: [id number]     Advanced Heart Failure Rounding Note  PCP-Cardiologist: Donato Schultz, MD   Subjective:    Patient diuresed with IV Lasix yesterday, weight down 7 lbs.  Still having trouble with CVP line, getting new set-up.  Co-ox good at 78%.    Objective:   Weight Range: (!) 222.9 kg Body mass index is 62.75 kg/m.   Vital Signs:   Temp:  [98.1 F (36.7 C)-98.7 F (37.1 C)] 98.1 F (36.7 C) (07/11 0812) Pulse Rate:  [100-108] 100 (07/11 0812) Resp:  [15-28] 15 (07/11 0812) BP: (106-141)/(78-91) 124/86 (07/11 0812) SpO2:  [91 %-100 %] 91 % (07/11 0812) Weight:  [222.9 kg] 222.9 kg (07/11 0448) Last BM Date: 11/29/19  Weight change: Filed Weights   11/29/19 0431 11/30/19 0500 12/01/19 0448  Weight: (!) 227.4 kg (!) 226.2 kg (!) 222.9 kg    Intake/Output:   Intake/Output Summary (Last 24 hours) at 12/01/2019 1140 Last data filed at 12/01/2019 1140 Gross per 24 hour  Intake 610 ml  Output 6545 ml  Net -5935 ml      Physical Exam    General: NAD Neck: Thick, JVP 12 cm, no thyromegaly or thyroid nodule.  Lungs: Clear to auscultation bilaterally with normal respiratory effort. CV: Nondisplaced PMI.  Heart regular S1/S2, no S3/S4, no murmur.  1+ edema to knees.  Abdomen: Soft, nontender, no hepatosplenomegaly, no distention.  Skin: Intact without lesions or rashes.  Neurologic: Alert and oriented x 3.  Psych: Normal affect. Extremities: No clubbing or cyanosis.  HEENT: Normal.    Telemetry   NSR 90s-100s (personally reviewed)  Labs    CBC No results for input(s): WBC, NEUTROABS, HGB, HCT, MCV, PLT in the last 72 hours. Basic Metabolic Panel Recent Labs    13/24/40 0257 12/01/19 1020  NA 136 135  K 3.9 4.1  CL 90* 87*  CO2 34* 37*  GLUCOSE 196* 270*  BUN 15 14  CREATININE 0.82 0.92  CALCIUM 7.8* 8.1*  MG 1.6* 1.6*   Liver Function Tests Recent Labs    11/29/19 0238  12/01/19 1020  AST 56* 40  ALT 82* 59*  ALKPHOS 86 100  BILITOT 2.9* 2.3*  PROT 6.2* 6.8  ALBUMIN 2.8* 2.7*   No results for input(s): LIPASE, AMYLASE in the last 72 hours. Cardiac Enzymes No results for input(s): CKTOTAL, CKMB, CKMBINDEX, TROPONINI in the last 72 hours.  BNP: BNP (last 3 results) Recent Labs    11/27/19 1317  BNP 441.1*    ProBNP (last 3 results) No results for input(s): PROBNP in the last 8760 hours.   D-Dimer No results for input(s): DDIMER in the last 72 hours. Hemoglobin A1C No results for input(s): HGBA1C in the last 72 hours. Fasting Lipid Panel No results for input(s): CHOL, HDL, LDLCALC, TRIG, CHOLHDL, LDLDIRECT in the last 72 hours. Thyroid Function Tests No results for input(s): TSH, T4TOTAL, T3FREE, THYROIDAB in the last 72 hours.  Invalid input(s): FREET3  Other results:   Imaging    No results found.   Medications:     Scheduled Medications: . carvedilol  3.125 mg Oral BID WC  . Chlorhexidine Gluconate Cloth  6 each Topical Daily  . digoxin  0.125 mg Oral Daily  . enoxaparin (LOVENOX) injection  110 mg Subcutaneous Q24H  . folic acid  1 mg Oral Daily  . furosemide  80 mg Intravenous BID  . insulin aspart  0-15 Units Subcutaneous TID WC  . magnesium oxide  400 mg Oral Daily  . sacubitril-valsartan  1 tablet Oral BID  . sodium chloride flush  10-40 mL Intracatheter Q12H  . sodium chloride flush  3 mL Intravenous Q12H  . spironolactone  12.5 mg Oral Once  . [START ON 12/02/2019] spironolactone  25 mg Oral Daily  . thiamine  100 mg Oral Daily    Infusions: . sodium chloride    . magnesium sulfate bolus IVPB 2 g (12/01/19 1138)    PRN Medications: sodium chloride, acetaminophen, guaiFENesin-dextromethorphan, LORazepam, ondansetron (ZOFRAN) IV, sodium chloride flush, sodium chloride flush   Assessment/Plan   1. Acute systolic CHF: Echo was difficult but EF appears to be 20-25%.  Symptoms for at least 2 wks, no  definite trigger.  No chest pain.  Cause is uncertain.  However, heavy ETOH may play a role as well as HTN and untreated diabetes.  Cannot rule out viral myocarditis.  Cannot rule out coronary disease as he has risk factors.  ECG is narrow complex so not CRT candidate.  Sinus tachycardia has been concerning for possible low cardiac output but co-ox 78% this morning.  CVP line not working this morning but still looks volume overloaded by exam.   - Continue Lasix 80 mg IV bid, replace K and Mg.  - Nursing to troubleshoot PICC.  - Continue digoxin 0.125 daily.  - Increase spironolactone 25 mg daily.  - Add Coreg 3.125 bid given excellent co-ox.   - Continue Entresto 24/26 bid.   - RHC/LHC on Monday after further diuresis.  We discussed risks/benefits and he agrees to procedure.  2. Type 2 diabetes: New diagnosis with hgbA1c 11.  Per primary team.  3. Suspect OSA: Outpatient sleep study.  4. ETOH abuse: Heavy drinker when not working.  We discussed cutting this out, he feels like he can.   Length of Stay: 3  Latravion Graves, MD  12/01/2019, 11:40 AM  Advanced Heart Failure Team Pager [phone] (M-F; 7a - 4p)  Please contact [facility] for night-coverage after hours (4p -7a ) and weekends on [email or web address]  

## 2019-12-01 NOTE — H&P (View-Only) (Signed)
Patient ID: Sean Lawson, male   DOB: 01/23/1986, 34 y.o.   MRN: [id number]     Advanced Heart Failure Rounding Note  PCP-Cardiologist: Donato Schultz, MD   Subjective:    Patient diuresed with IV Lasix yesterday, weight down 7 lbs.  Still having trouble with CVP line, getting new set-up.  Co-ox good at 78%.    Objective:   Weight Range: (!) 222.9 kg Body mass index is 62.75 kg/m.   Vital Signs:   Temp:  [98.1 F (36.7 C)-98.7 F (37.1 C)] 98.1 F (36.7 C) (07/11 0812) Pulse Rate:  [100-108] 100 (07/11 0812) Resp:  [15-28] 15 (07/11 0812) BP: (106-141)/(78-91) 124/86 (07/11 0812) SpO2:  [91 %-100 %] 91 % (07/11 0812) Weight:  [222.9 kg] 222.9 kg (07/11 0448) Last BM Date: 11/29/19  Weight change: Filed Weights   11/29/19 0431 11/30/19 0500 12/01/19 0448  Weight: (!) 227.4 kg (!) 226.2 kg (!) 222.9 kg    Intake/Output:   Intake/Output Summary (Last 24 hours) at 12/01/2019 1140 Last data filed at 12/01/2019 1140 Gross per 24 hour  Intake 610 ml  Output 6545 ml  Net -5935 ml      Physical Exam    General: NAD Neck: Thick, JVP 12 cm, no thyromegaly or thyroid nodule.  Lungs: Clear to auscultation bilaterally with normal respiratory effort. CV: Nondisplaced PMI.  Heart regular S1/S2, no S3/S4, no murmur.  1+ edema to knees.  Abdomen: Soft, nontender, no hepatosplenomegaly, no distention.  Skin: Intact without lesions or rashes.  Neurologic: Alert and oriented x 3.  Psych: Normal affect. Extremities: No clubbing or cyanosis.  HEENT: Normal.    Telemetry   NSR 90s-100s (personally reviewed)  Labs    CBC No results for input(s): WBC, NEUTROABS, HGB, HCT, MCV, PLT in the last 72 hours. Basic Metabolic Panel Recent Labs    13/24/40 0257 12/01/19 1020  NA 136 135  K 3.9 4.1  CL 90* 87*  CO2 34* 37*  GLUCOSE 196* 270*  BUN 15 14  CREATININE 0.82 0.92  CALCIUM 7.8* 8.1*  MG 1.6* 1.6*   Liver Function Tests Recent Labs    11/29/19 0238  12/01/19 1020  AST 56* 40  ALT 82* 59*  ALKPHOS 86 100  BILITOT 2.9* 2.3*  PROT 6.2* 6.8  ALBUMIN 2.8* 2.7*   No results for input(s): LIPASE, AMYLASE in the last 72 hours. Cardiac Enzymes No results for input(s): CKTOTAL, CKMB, CKMBINDEX, TROPONINI in the last 72 hours.  BNP: BNP (last 3 results) Recent Labs    11/27/19 1317  BNP 441.1*    ProBNP (last 3 results) No results for input(s): PROBNP in the last 8760 hours.   D-Dimer No results for input(s): DDIMER in the last 72 hours. Hemoglobin A1C No results for input(s): HGBA1C in the last 72 hours. Fasting Lipid Panel No results for input(s): CHOL, HDL, LDLCALC, TRIG, CHOLHDL, LDLDIRECT in the last 72 hours. Thyroid Function Tests No results for input(s): TSH, T4TOTAL, T3FREE, THYROIDAB in the last 72 hours.  Invalid input(s): FREET3  Other results:   Imaging    No results found.   Medications:     Scheduled Medications: . carvedilol  3.125 mg Oral BID WC  . Chlorhexidine Gluconate Cloth  6 each Topical Daily  . digoxin  0.125 mg Oral Daily  . enoxaparin (LOVENOX) injection  110 mg Subcutaneous Q24H  . folic acid  1 mg Oral Daily  . furosemide  80 mg Intravenous BID  . insulin aspart  0-15 Units Subcutaneous TID WC  . magnesium oxide  400 mg Oral Daily  . sacubitril-valsartan  1 tablet Oral BID  . sodium chloride flush  10-40 mL Intracatheter Q12H  . sodium chloride flush  3 mL Intravenous Q12H  . spironolactone  12.5 mg Oral Once  . [START ON 12/02/2019] spironolactone  25 mg Oral Daily  . thiamine  100 mg Oral Daily    Infusions: . sodium chloride    . magnesium sulfate bolus IVPB 2 g (12/01/19 1138)    PRN Medications: sodium chloride, acetaminophen, guaiFENesin-dextromethorphan, LORazepam, ondansetron (ZOFRAN) IV, sodium chloride flush, sodium chloride flush   Assessment/Plan   1. Acute systolic CHF: Echo was difficult but EF appears to be 20-25%.  Symptoms for at least 2 wks, no  definite trigger.  No chest pain.  Cause is uncertain.  However, heavy ETOH may play a role as well as HTN and untreated diabetes.  Cannot rule out viral myocarditis.  Cannot rule out coronary disease as he has risk factors.  ECG is narrow complex so not CRT candidate.  Sinus tachycardia has been concerning for possible low cardiac output but co-ox 78% this morning.  CVP line not working this morning but still looks volume overloaded by exam.   - Continue Lasix 80 mg IV bid, replace K and Mg.  - Nursing to troubleshoot PICC.  - Continue digoxin 0.125 daily.  - Increase spironolactone 25 mg daily.  - Add Coreg 3.125 bid given excellent co-ox.   - Continue Entresto 24/26 bid.   - RHC/LHC on Monday after further diuresis.  We discussed risks/benefits and he agrees to procedure.  2. Type 2 diabetes: New diagnosis with hgbA1c 11.  Per primary team.  3. Suspect OSA: Outpatient sleep study.  4. ETOH abuse: Heavy drinker when not working.  We discussed cutting this out, he feels like he can.   Length of Stay: 3  Marca Ancona, MD  12/01/2019, 11:40 AM  Advanced Heart Failure Team Pager [phone] (M-F; 7a - 4p)  Please contact [facility] for night-coverage after hours (4p -7a ) and weekends on [email or web address]

## 2019-12-01 NOTE — Progress Notes (Addendum)
Plan of care reviewed. Pt's been progressing. He's hemodynamically stable. Alert and oriented x 4.  On 2.5 LPM of O2 NCL tonight, SPO2 98-100% with HOB > 30 degrees. RR 22-24, HR 100-105, ST on monitor, BP within normal limits. CVP 13 mmHg. He remained afebrile. Stated he is doing okay, no breathing difficulty or SOB. Denied chest pain. He's able to ambulate independently to bathroom. No immediate distress noted tonight. We will continue to monitor.  Filiberto Pinks, RN

## 2019-12-02 ENCOUNTER — Inpatient Hospital Stay (HOSPITAL_COMMUNITY)
Admission: EM | Disposition: A | Discharge status: Home / Self Care | Payer: Self-pay | Source: Home / Self Care | Attending: Internal Medicine

## 2019-12-02 ENCOUNTER — Inpatient Hospital Stay (HOSPITAL_COMMUNITY): Payer: 59

## 2019-12-02 DIAGNOSIS — I509 Heart failure, unspecified: Secondary | ICD-10-CM

## 2019-12-02 HISTORY — PX: RIGHT/LEFT HEART CATH AND CORONARY ANGIOGRAPHY: CATH118266

## 2019-12-02 LAB — CBC
HCT: 47.6 % (ref 39.0–52.0)
Hemoglobin: 14.6 g/dL (ref 13.0–17.0)
MCH: 27.9 pg (ref 26.0–34.0)
MCHC: 30.7 g/dL (ref 30.0–36.0)
MCV: 90.8 fL (ref 80.0–100.0)
Platelets: 270 10*3/uL (ref 150–400)
RBC: 5.24 MIL/uL (ref 4.22–5.81)
RDW: 14.3 % (ref 11.5–15.5)
WBC: 8.2 10*3/uL (ref 4.0–10.5)
nRBC: 0 % (ref 0.0–0.2)

## 2019-12-02 LAB — BASIC METABOLIC PANEL
Anion gap: 10 (ref 5–15)
BUN: 13 mg/dL (ref 6–20)
CO2: 38 mmol/L — ABNORMAL HIGH (ref 22–32)
Calcium: 8.1 mg/dL — ABNORMAL LOW (ref 8.9–10.3)
Chloride: 88 mmol/L — ABNORMAL LOW (ref 98–111)
Creatinine, Ser: 0.87 mg/dL (ref 0.61–1.24)
GFR calc Af Amer: >60 mL/min (ref >60)
GFR calc non Af Amer: >60 mL/min (ref >60)
Glucose, Bld: 194 mg/dL — ABNORMAL HIGH (ref 70–99)
Potassium: 4 mmol/L (ref 3.5–5.1)
Sodium: 136 mmol/L (ref 135–145)

## 2019-12-02 LAB — POCT I-STAT EG7
Acid-Base Excess: 14 mmol/L — ABNORMAL HIGH (ref 0.0–2.0)
Acid-Base Excess: 14 mmol/L — ABNORMAL HIGH (ref 0.0–2.0)
Bicarbonate: 42 mmol/L — ABNORMAL HIGH (ref 20.0–28.0)
Bicarbonate: 42.3 mmol/L — ABNORMAL HIGH (ref 20.0–28.0)
Calcium, Ion: 1.04 mmol/L — ABNORMAL LOW (ref 1.15–1.40)
Calcium, Ion: 1.05 mmol/L — ABNORMAL LOW (ref 1.15–1.40)
HCT: 44 % (ref 39.0–52.0)
HCT: 44 % (ref 39.0–52.0)
Hemoglobin: 15 g/dL (ref 13.0–17.0)
Hemoglobin: 15 g/dL (ref 13.0–17.0)
O2 Saturation: 56 %
O2 Saturation: 60 %
Potassium: 3.7 mmol/L (ref 3.5–5.1)
Potassium: 3.8 mmol/L (ref 3.5–5.1)
Sodium: 138 mmol/L (ref 135–145)
Sodium: 138 mmol/L (ref 135–145)
TCO2: 44 mmol/L — ABNORMAL HIGH (ref 22–32)
TCO2: 44 mmol/L — ABNORMAL HIGH (ref 22–32)
pCO2, Ven: 66 mmHg — ABNORMAL HIGH (ref 44.0–60.0)
pCO2, Ven: 66.2 mmHg — ABNORMAL HIGH (ref 44.0–60.0)
pH, Ven: 7.412 (ref 7.250–7.430)
pH, Ven: 7.414 (ref 7.250–7.430)
pO2, Ven: 30 mmHg — CL (ref 32.0–45.0)
pO2, Ven: 33 mmHg (ref 32.0–45.0)

## 2019-12-02 LAB — GLUCOSE, CAPILLARY
Glucose-Capillary: 181 mg/dL — ABNORMAL HIGH (ref 70–99)
Glucose-Capillary: 180 mg/dL — ABNORMAL HIGH (ref 70–99)
Glucose-Capillary: 176 mg/dL — ABNORMAL HIGH (ref 70–99)
Glucose-Capillary: 233 mg/dL — ABNORMAL HIGH (ref 70–99)
Glucose-Capillary: 180 mg/dL — ABNORMAL HIGH (ref 70–99)

## 2019-12-02 LAB — COOXEMETRY PANEL
Carboxyhemoglobin: 2.3 % — ABNORMAL HIGH (ref 0.5–1.5)
Methemoglobin: 0.9 % (ref 0.0–1.5)
O2 Saturation: 64.7 %
Total hemoglobin: 13.7 g/dL (ref 12.0–16.0)

## 2019-12-02 LAB — CREATININE, SERUM
Creatinine, Ser: 0.91 mg/dL (ref 0.61–1.24)
GFR calc Af Amer: >60 mL/min (ref >60)
GFR calc non Af Amer: >60 mL/min (ref >60)

## 2019-12-02 LAB — MAGNESIUM: Magnesium: 1.7 mg/dL (ref 1.7–2.4)

## 2019-12-02 LAB — SURGICAL PCR SCREEN
MRSA, PCR: NEGATIVE
Staphylococcus aureus: NEGATIVE

## 2019-12-02 SURGERY — RIGHT/LEFT HEART CATH AND CORONARY ANGIOGRAPHY
Anesthesia: LOCAL

## 2019-12-02 MED ORDER — HEPARIN SODIUM (PORCINE) 1000 UNIT/ML IJ SOLN
INTRAMUSCULAR | Status: DC | PRN
  Administered 2019-12-02: 8000 [IU] via INTRAVENOUS

## 2019-12-02 MED ORDER — SACUBITRIL-VALSARTAN 49-51 MG PO TABS
1.0000 | ORAL_TABLET | Freq: Two times a day (BID) | ORAL | Status: DC
  Administered 2019-12-02 – 2019-12-03 (×2): 1 via ORAL
  Filled 2019-12-02 (×2): qty 1

## 2019-12-02 MED ORDER — IOHEXOL 350 MG/ML SOLN
INTRAVENOUS | Status: DC | PRN
  Administered 2019-12-02: 55 mL via INTRA_ARTERIAL

## 2019-12-02 MED ORDER — LIDOCAINE HCL (PF) 1 % IJ SOLN
INTRAMUSCULAR | Status: AC
  Filled 2019-12-02: qty 30

## 2019-12-02 MED ORDER — FENTANYL CITRATE (PF) 100 MCG/2ML IJ SOLN
INTRAMUSCULAR | Status: AC
  Filled 2019-12-02: qty 2

## 2019-12-02 MED ORDER — ACETAMINOPHEN 325 MG PO TABS: 650.0000 mg | ORAL_TABLET | ORAL | Status: DC | PRN

## 2019-12-02 MED ORDER — HEPARIN (PORCINE) IN NACL 1000-0.9 UT/500ML-% IV SOLN
INTRAVENOUS | Status: AC
  Filled 2019-12-02: qty 1000

## 2019-12-02 MED ORDER — HYDRALAZINE HCL 20 MG/ML IJ SOLN: 10.0000 mg | INTRAMUSCULAR | Status: AC | PRN

## 2019-12-02 MED ORDER — ONDANSETRON HCL 4 MG/2ML IJ SOLN: 4.0000 mg | Freq: Four times a day (QID) | INTRAMUSCULAR | Status: DC | PRN

## 2019-12-02 MED ORDER — LABETALOL HCL 5 MG/ML IV SOLN: 10.0000 mg | INTRAVENOUS | Status: AC | PRN

## 2019-12-02 MED ORDER — VERAPAMIL HCL 2.5 MG/ML IV SOLN
INTRAVENOUS | Status: AC
  Filled 2019-12-02: qty 2

## 2019-12-02 MED ORDER — SODIUM CHLORIDE 0.9 % IV SOLN: 250.0000 mL | INTRAVENOUS | Status: DC | PRN

## 2019-12-02 MED ORDER — ENOXAPARIN SODIUM 40 MG/0.4ML ~~LOC~~ SOLN: 40.0000 mg | SUBCUTANEOUS | Status: DC

## 2019-12-02 MED ORDER — SODIUM CHLORIDE 0.9% FLUSH
3.0000 mL | Freq: Two times a day (BID) | INTRAVENOUS | Status: DC
  Administered 2019-12-03: 3 mL via INTRAVENOUS

## 2019-12-02 MED ORDER — MIDAZOLAM HCL 2 MG/2ML IJ SOLN
INTRAMUSCULAR | Status: DC | PRN
  Administered 2019-12-02: 1 mg via INTRAVENOUS

## 2019-12-02 MED ORDER — HEPARIN (PORCINE) IN NACL 1000-0.9 UT/500ML-% IV SOLN
INTRAVENOUS | Status: DC | PRN
  Administered 2019-12-02 (×2): 500 mL

## 2019-12-02 MED ORDER — HEPARIN SODIUM (PORCINE) 1000 UNIT/ML IJ SOLN
INTRAMUSCULAR | Status: AC
  Filled 2019-12-02: qty 1

## 2019-12-02 MED ORDER — LIDOCAINE HCL (PF) 1 % IJ SOLN
INTRAMUSCULAR | Status: DC | PRN
  Administered 2019-12-02: 2 mL via INTRADERMAL

## 2019-12-02 MED ORDER — MIDAZOLAM HCL 2 MG/2ML IJ SOLN
INTRAMUSCULAR | Status: AC
  Filled 2019-12-02: qty 2

## 2019-12-02 MED ORDER — SODIUM CHLORIDE 0.9% FLUSH: 3.0000 mL | INTRAVENOUS | Status: DC | PRN

## 2019-12-02 MED ORDER — FENTANYL CITRATE (PF) 100 MCG/2ML IJ SOLN
INTRAMUSCULAR | Status: DC | PRN
  Administered 2019-12-02: 25 ug via INTRAVENOUS

## 2019-12-02 MED ORDER — FUROSEMIDE 10 MG/ML IJ SOLN
80.0000 mg | Freq: Two times a day (BID) | INTRAMUSCULAR | Status: DC
  Administered 2019-12-02: 80 mg via INTRAVENOUS

## 2019-12-02 SURGICAL SUPPLY — 12 items
BRACE RADIAL COMPRESSION RADST (HEMOSTASIS) ×1 IMPLANT
CATH 5FR JL3.5 JR4 ANG PIG MP (CATHETERS) ×1 IMPLANT
CATH BALLN WEDGE 5F 110CM (CATHETERS) ×1 IMPLANT
ELECT DEFIB PAD ADLT CADENCE (PAD) ×1 IMPLANT
GLIDESHEATH SLEND SS 6F .021 (SHEATH) ×1 IMPLANT
GUIDEWIRE INQWIRE 1.5J.035X260 (WIRE) IMPLANT
INQWIRE 1.5J .035X260CM (WIRE) ×2
KIT HEART LEFT (KITS) ×2 IMPLANT
PACK CARDIAC CATHETERIZATION (CUSTOM PROCEDURE TRAY) ×2 IMPLANT
SHEATH GLIDE SLENDER 4/5FR (SHEATH) ×1 IMPLANT
SHEATH PROBE COVER 6X72 (BAG) ×1 IMPLANT
TRANSDUCER W/STOPCOCK (MISCELLANEOUS) ×2 IMPLANT

## 2019-12-02 NOTE — Progress Notes (Signed)
Rt radial rad STAT loosened. Level 0. No oozing.

## 2019-12-02 NOTE — Progress Notes (Signed)
Rt radial rad STAT removed. Site level 0. Palpable rt radial pulse. Gauze and tegaderm dressing applied. Rt hand and fingers pink and warm.

## 2019-12-02 NOTE — Interval H&P Note (Signed)
History and Physical Interval Note:  12/02/2019 1:55 PM  PAZ WINSETT  has presented today for surgery, with the diagnosis of heart failure.  The various methods of treatment have been discussed with the patient and family. After consideration of risks, benefits and other options for treatment, the patient has consented to  Procedure(s): RIGHT/LEFT HEART CATH AND CORONARY ANGIOGRAPHY (N/A) as a surgical intervention.  The patient's history has been reviewed, patient examined, no change in status, stable for surgery.  I have reviewed the patient's chart and labs.  Questions were answered to the patient's satisfaction.     Rasmus Preusser [employer]

## 2019-12-02 NOTE — Progress Notes (Signed)
Plan of care reviewed. Pt's hemodynamically stable. Remained afebrile, NSR on monitor, HR 90s-112, BP stable in normal limits. He's on 2.5 LPM of O2 NCL at night, SPO2 92-96%, denied SOB, denied chest pain. No immediate distress noted tonight.  Inform consent signed, NPO AMN, pre- cardiac cath skin prep done. Lab and EKG in chart. Pre-cath education was unavailable on CCTV. We will follow up with IT.   Filiberto Pinks, RN

## 2019-12-02 NOTE — Progress Notes (Signed)
CARDIAC REHAB PHASE I   PRE:  Rate/Rhythm: 94 SR    BP: sitting 108/80    SaO2: 95 2 1/2L, 88 RA  MODE:  Ambulation: 860 ft   POST:  Rate/Rhythm: 114 ST    BP: sitting 129/96     SaO2: 88 RA  Pt preferred to walk without O2. SaO2 mostly 88-90 RA. Tolerated walk fairly well. Sts his legs are somewhat weak but better than admit. SOB seemed minimal. Began discussing HF management, low sodium, DM diet, exercise, and CRPII with pt and parents. Pt voiced understanding however diet changes are going to be difficult as he does not like many vegetables and he eats out most meals. Will continue to discuss. He is interested in CRPII and will send referral to [place] [facility]. He does have a treadmill that he uses at home. [phone]   Harriet Masson CES, ACSM 12/02/2019 11:12 AM

## 2019-12-02 NOTE — Progress Notes (Signed)
VAST consulted to assess RA DL PICC which has been "nothing but trouble since placement" per pt and his Mom. Pt reported insertion seemed to go smoothly, but since has been problematic with blood return and causing pump to beep occluded.  Assessed RA DL PICC and changed caps on both lumens: red lumen without blood return and would not flush at all, purple lumen without blood return and extremely difficult to flush.  Turned off IV fluids and capped lines. Spoke with pt's nurse and asked that she contact physician to order chest x-ray for PICC tip location. Asked that she place another IV team consult once x-ray results available.

## 2019-12-02 NOTE — Progress Notes (Addendum)
Patient ID: Sean Lawson, male   DOB: Mar 23, 1986, 34 y.o.   MRN: [id number]     Advanced Heart Failure Rounding Note  PCP-Cardiologist: Candee Furbish, MD   Subjective:    Denies SOB. Able to walk to the bathroom.    Objective:   Weight Range: (!) 219 kg Body mass index is 61.64 kg/m.   Vital Signs:   Temp:  [97.9 F (36.6 C)-98.5 F (36.9 C)] 98 F (36.7 C) (07/12 0755) Pulse Rate:  [93-111] 94 (07/12 0755) Resp:  [12-23] 20 (07/12 0755) BP: (98-129)/(64-110) 129/69 (07/12 0755) SpO2:  [92 %-96 %] 95 % (07/12 0755) Weight:  [219 kg] 219 kg (07/12 0421) Last BM Date: 12/01/19  Weight change: Filed Weights   11/30/19 0500 12/01/19 0448 12/02/19 0421  Weight: (!) 226.2 kg (!) 222.9 kg (!) 219 kg    Intake/Output:   Intake/Output Summary (Last 24 hours) at 12/02/2019 1009 Last data filed at 12/02/2019 0830 Gross per 24 hour  Intake 1030 ml  Output 3250 ml  Net -2220 ml      Physical Exam    General: No resp difficulty HEENT: normal Neck: supple. JVP difficult to assess due to body habitus.  Carotids 2+ bilat; no bruits. No lymphadenopathy or thryomegaly appreciated. Cor: PMI nondisplaced. Regular rate & rhythm. No rubs, gallops or murmurs. Lungs: clear Abdomen: obese, soft, nontender, nondistended. No hepatosplenomegaly. No bruits or masses. Good bowel sounds. Extremities: no cyanosis, clubbing, rash, R and LLE trace edema Neuro: alert & orientedx3, cranial nerves grossly intact. moves all 4 extremities w/o difficulty. Affect pleasant    Telemetry   NSR 90-100s   Labs    CBC No results for input(s): WBC, NEUTROABS, HGB, HCT, MCV, PLT in the last 72 hours. Basic Metabolic Panel Recent Labs    12/01/19 1020 12/02/19 0442  NA 135 136  K 4.1 4.0  CL 87* 88*  CO2 37* 38*  GLUCOSE 270* 194*  BUN 14 13  CREATININE 0.92 0.87  CALCIUM 8.1* 8.1*  MG 1.6* 1.7   Liver Function Tests Recent Labs    12/01/19 1020  AST 40  ALT 59*  ALKPHOS 100    BILITOT 2.3*  PROT 6.8  ALBUMIN 2.7*   No results for input(s): LIPASE, AMYLASE in the last 72 hours. Cardiac Enzymes No results for input(s): CKTOTAL, CKMB, CKMBINDEX, TROPONINI in the last 72 hours.  BNP: BNP (last 3 results) Recent Labs    11/27/19 1317  BNP 441.1*    ProBNP (last 3 results) No results for input(s): PROBNP in the last 8760 hours.   D-Dimer No results for input(s): DDIMER in the last 72 hours. Hemoglobin A1C No results for input(s): HGBA1C in the last 72 hours. Fasting Lipid Panel No results for input(s): CHOL, HDL, LDLCALC, TRIG, CHOLHDL, LDLDIRECT in the last 72 hours. Thyroid Function Tests No results for input(s): TSH, T4TOTAL, T3FREE, THYROIDAB in the last 72 hours.  Invalid input(s): FREET3  Other results:   Imaging    No results found.   Medications:     Scheduled Medications: . carvedilol  3.125 mg Oral BID WC  . Chlorhexidine Gluconate Cloth  6 each Topical Daily  . digoxin  0.125 mg Oral Daily  . enoxaparin (LOVENOX) injection  110 mg Subcutaneous Q24H  . folic acid  1 mg Oral Daily  . furosemide  80 mg Intravenous BID  . insulin aspart  0-15 Units Subcutaneous TID WC  . magnesium oxide  400 mg Oral Daily  .  sacubitril-valsartan  1 tablet Oral BID  . sodium chloride flush  10-40 mL Intracatheter Q12H  . sodium chloride flush  3 mL Intravenous Q12H  . sodium chloride flush  3 mL Intravenous Q12H  . spironolactone  25 mg Oral Daily  . thiamine  100 mg Oral Daily    Infusions: . sodium chloride    . sodium chloride    . sodium chloride 10 mL/hr at 12/02/19 0030    PRN Medications: sodium chloride, sodium chloride, acetaminophen, guaiFENesin-dextromethorphan, LORazepam, ondansetron (ZOFRAN) IV, sodium chloride flush, sodium chloride flush, sodium chloride flush   Assessment/Plan   1. Acute systolic CHF: Echo was difficult but EF appears to be 20-25%.  Symptoms for at least 2 wks, no definite trigger.  No chest pain.   Cause is uncertain.  However, heavy ETOH may play a role as well as HTN and untreated diabetes.  Cannot rule out viral myocarditis.  Cannot rule out coronary disease as he has risk factors.  ECG is narrow complex so not CRT candidate.  Sinus tachycardia has been concerning for possible low cardiac output but co-ox  Has been stable.  - Holding diuretics until after cath. Adjust diuretics after cath.  Renal function stable.  - Continue digoxin 0.125 daily.  - Continue spironolactone 25 mg daily.  -Continue Coreg 3.125 bid. CO-OX stable.  - Continue Entresto 24/26 bid.   - RHC/LHC today 2. Type 2 diabetes: New diagnosis with hgbA1c 11.  Diabetes Coordinator consulted.  Dietitian met with him.  3. Suspect OSA: Outpatient sleep study.  4. ETOH abuse: Heavy drinker when not working.  We discussed cutting this out, he feels like he can.  5. Obesity Body mass index is 61.64 kg/m.   Plan for RHC/LHC today at 1:00 .  Length of Stay: 4  Amy Clegg, NP  12/02/2019, 10:09 AM  Advanced Heart Failure Team Pager [phone] (M-F; 7a - 4p)  Please contact [facility] for night-coverage after hours (4p -7a ) and weekends on [email or web address]  Patient seen with NP, agree with the above note.   RHC/LHC done today:  Coronary Findings  Diagnostic Dominance: Right Left Main  No significant coronary disease.  Left Anterior Descending  No significant coronary disease.  Left Circumflex  No significant coronary disease.  Right Coronary Artery  No significant coronary disease.  Intervention  No interventions have been documented. Right Heart  Right Heart Pressures RHC Procedural Findings: Hemodynamics (mmHg) RA mean 10 RV 45/12 PA 49/26, mean 34 PCWP mean 17 LV 112/23 AO 111/86  Oxygen saturations: PA 60% AO 93%  Cardiac Output (Fick) 6.2  Cardiac Index (Fick) 2.0 PVR 2.7 WU   General: NAD Neck: Thick, JVP 10 cm, no thyromegaly or thyroid nodule.  Lungs: Clear to auscultation bilaterally  with normal respiratory effort. CV: Nondisplaced PMI.  Heart regular S1/S2, no S3/S4, no murmur.  1+ edema 1/2 to knees bilaterally.  Abdomen: Soft, nontender, no hepatosplenomegaly, no distention.  Skin: Intact without lesions or rashes.  Neurologic: Alert and oriented x 3.  Psych: Normal affect. Extremities: No clubbing or cyanosis.  HEENT: Normal.   Nonischemic cardiomyopathy. Would continue diuresis with 2 more doses of Lasix 80 mg IV bid, then suspect he will be near euvolemic.  I will also increase Entresto to 49/51 bid.  Could possibly go home tomorrow afternoon.   Loralie Champagne 12/02/2019 2:45 PM

## 2019-12-02 NOTE — Progress Notes (Signed)
Rad stat band present on right wrist, Band loosened by 64mm at 15:30 and again at 16:00.  No S+S of hematoma.

## 2019-12-02 NOTE — Progress Notes (Signed)
12/02/2019 1730 IV Team called and stated we are not to use the PICC line and asked nurse to place order for PICC line exchange.   Kathryne Hitch

## 2019-12-02 NOTE — Progress Notes (Addendum)
Rad STAT loosened. Rt radial site level 0, rt hand and fingers warm and pink. Site w/o bleeding

## 2019-12-03 ENCOUNTER — Encounter (HOSPITAL_COMMUNITY): Payer: Self-pay | Admitting: Cardiology

## 2019-12-03 ENCOUNTER — Telehealth (HOSPITAL_COMMUNITY): Payer: Self-pay | Admitting: Pharmacy Technician

## 2019-12-03 ENCOUNTER — Telehealth (HOSPITAL_COMMUNITY): Payer: Self-pay | Admitting: Cardiology

## 2019-12-03 DIAGNOSIS — R0683 Snoring: Secondary | ICD-10-CM

## 2019-12-03 LAB — BASIC METABOLIC PANEL
Anion gap: 12 (ref 5–15)
BUN: 13 mg/dL (ref 6–20)
CO2: 35 mmol/L — ABNORMAL HIGH (ref 22–32)
Calcium: 8.4 mg/dL — ABNORMAL LOW (ref 8.9–10.3)
Chloride: 90 mmol/L — ABNORMAL LOW (ref 98–111)
Creatinine, Ser: 0.88 mg/dL (ref 0.61–1.24)
GFR calc Af Amer: >60 mL/min (ref >60)
GFR calc non Af Amer: >60 mL/min (ref >60)
Glucose, Bld: 182 mg/dL — ABNORMAL HIGH (ref 70–99)
Potassium: 3.9 mmol/L (ref 3.5–5.1)
Sodium: 137 mmol/L (ref 135–145)

## 2019-12-03 LAB — GLUCOSE, CAPILLARY
Glucose-Capillary: 161 mg/dL — ABNORMAL HIGH (ref 70–99)
Glucose-Capillary: 223 mg/dL — ABNORMAL HIGH (ref 70–99)

## 2019-12-03 LAB — MAGNESIUM: Magnesium: 1.5 mg/dL — ABNORMAL LOW (ref 1.7–2.4)

## 2019-12-03 MED ORDER — MAGNESIUM OXIDE 400 (241.3 MG) MG PO TABS: 400.0000 mg | ORAL_TABLET | Freq: Every day | ORAL | 6 refills | Status: DC

## 2019-12-03 MED ORDER — FUROSEMIDE 10 MG/ML IJ SOLN
80.0000 mg | Freq: Once | INTRAMUSCULAR | Status: AC
  Administered 2019-12-03: 80 mg via INTRAVENOUS
  Filled 2019-12-03: qty 8

## 2019-12-03 MED ORDER — CARVEDILOL 3.125 MG PO TABS: 3.1250 mg | ORAL_TABLET | Freq: Two times a day (BID) | ORAL | 6 refills | Status: DC

## 2019-12-03 MED ORDER — SPIRONOLACTONE 25 MG PO TABS: 25.0000 mg | ORAL_TABLET | Freq: Every day | ORAL | 6 refills | Status: DC

## 2019-12-03 MED ORDER — DIGOXIN 125 MCG PO TABS: 0.1250 mg | ORAL_TABLET | Freq: Every day | ORAL | 6 refills | Status: DC

## 2019-12-03 MED ORDER — SACUBITRIL-VALSARTAN 49-51 MG PO TABS: 1.0000 | ORAL_TABLET | Freq: Two times a day (BID) | ORAL | 6 refills | Status: DC

## 2019-12-03 MED ORDER — FUROSEMIDE 80 MG PO TABS
80.0000 mg | ORAL_TABLET | Freq: Every day | ORAL | 11 refills | Status: DC
Start: 2019-12-04 — End: 2019-12-23

## 2019-12-03 MED ORDER — METFORMIN HCL 500 MG PO TABS: 500.0000 mg | ORAL_TABLET | Freq: Two times a day (BID) | ORAL | 11 refills | Status: DC

## 2019-12-03 MED ORDER — MAGNESIUM SULFATE 4 GM/100ML IV SOLN
4.0000 g | Freq: Once | INTRAVENOUS | Status: AC
  Administered 2019-12-03: 4 g via INTRAVENOUS
  Filled 2019-12-03: qty 100

## 2019-12-03 MED FILL — Verapamil HCl IV Soln 2.5 MG/ML: INTRAVENOUS | Qty: 2 | Status: AC

## 2019-12-03 MED FILL — ENTRESTO 49 MG-51 MG TABLET: 49-51 | 30 days supply | Qty: 60 | Fill #0

## 2019-12-03 MED FILL — MAGNESIUM OXIDE 400 MG TABS: 400 | 30 days supply | Qty: 30 | Fill #0

## 2019-12-03 MED FILL — DIGOXIN 0.125 MG TABLET: 125 | 30 days supply | Qty: 30 | Fill #0

## 2019-12-03 MED FILL — CARVEDILOL 3.125 MG TABLET: 3.125 | 30 days supply | Qty: 60 | Fill #0

## 2019-12-03 MED FILL — SPIRONOLACTONE 25 MG TABLET: 25 | 30 days supply | Qty: 30 | Fill #0

## 2019-12-03 MED FILL — FUROSEMIDE 80 MG TAB: 80 | 30 days supply | Qty: 30 | Fill #0

## 2019-12-03 MED FILL — metFORMIN HCL 500 MG TABS: 500 | 30 days supply | Qty: 60 | Fill #0

## 2019-12-03 NOTE — Progress Notes (Signed)
Right hand S/L IV d/c'd. Site WNL. Bandage applied. NO bleeding; Educated pt on care of Right upper arm PICC line that was D/C'd re: showering/ keeping dry/ post removal from earlier today.  Removed L AC wrap from around [facility] area. Site looks WNL. Right Radial area from Cath yesterday, bandage removed. Site WNL.

## 2019-12-03 NOTE — Telephone Encounter (Signed)
Per VO Tonye Becket, NP  Orders for home sleep study placed  Your provider has recommended that you have a home sleep study.  [employer] is the company that does these test.  They will contact you by phone and must speak with you before they can ship the equipment.  Once they have spoken with you they will send the equipment right to your home with instructions on how to set it up.  Once you have completed the test you just dispose of the equipment, the information is automatically uploaded to [place] via [employer] technology.  IF you have any questions or issues with the equipment please call the company directly at [phone].  If your test is positive for sleep apnea and you need a home CPAP machine you will be contacted by Dr Norris Cross office [facility]) to set this up.

## 2019-12-03 NOTE — Progress Notes (Signed)
CARDIAC REHAB PHASE I   PRE:  Rate/Rhythm: 91 SR    BP: lying 76/62, recheck 86/62    SaO2: 86-89 2L in bed  MODE:  Ambulation: 430 ft   POST:  Rate/Rhythm: 103 ST    BP: sitting 124/93     SaO2: 94 2L  Pt tolerated well. Walked hall part way without O2 however SaO2 down to 83 RA after 300 ft. Up to 94 with 2L. His SaO2 is also low in bed, 86-89 2L. Reviewed ed with pt and mother. He is receptive just admittedly overwhelmed. He sts ETOH will not be a problem but meal planning is difficult for him. Discussed easy options. Gave exercise guidelines for his treadmill.  [phone]  Harriet Masson CES, ACSM 12/03/2019 10:46 AM

## 2019-12-03 NOTE — Progress Notes (Signed)
SATURATION QUALIFICATIONS: (This note is used to comply with regulatory documentation for home oxygen)  Patient Saturations on Room Air at Rest = 84%  Patient Saturations on Room Air while Ambulating = 87%  Patient Saturations on  2 Liters of oxygen while Ambulating =  90%  Please briefly explain why patient needs home oxygen: CHF

## 2019-12-03 NOTE — Telephone Encounter (Signed)
Patient Name: Sean Lawson        DOB: 1986-04-05      Height: 6\' 2"      Office Name:[facility]         Referring Provider:Amy Clegg,NP/[place] [id number], MD  Today's Date:12/03/2019     STOP BANG RISK ASSESSMENT S (snore) Have you been told that you snore?     YES   T (tired) Are you often tired, fatigued, or sleepy during the day?   YES  O (obstruction) Do you stop breathing, choke, or gasp during sleep? YES   P (pressure) Do you have or are you being treated for high blood pressure? YES  B (BMI) Is your body index greater than 35 kg/m? YES   A (age) Are you 60 years old or older? NO   N (neck) Do you have a neck circumference greater than 16 inches?   YES  G (gender) Are you a male? YES  TOTAL STOP/BANG "YES" ANSWERS                                                                        For Office Use Only              Procedure Order Form    YES to 3+ Stop Bang questions OR two clinical symptoms - patient qualifies for WatchPAT (CPT 95800)     Submit: This Form + Patient Face Sheet + Clinical Note via Cloud[employer] or Fax: [phone]         Clinical Notes: Will consult Sleep Specialist and refer for management of therapy due to patient increased risk of Sleep Apnea. Ordering a sleep study due to the following two clinical symptoms: Excessive daytime sleepiness G47.10 / Gastroesophageal reflux K21.9 / Nocturia R35.1 / Morning Headaches G44.221 / Difficulty concentrating R41.840 / Memory problems or poor judgment G31.84 / Personality changes or irritability R45.4 / Loud snoring R06.83 / Depression F32.9 / Unrefreshed by sleep G47.8 / Impotence N52.9 / History of high blood pressure R03.0 / Insomnia G47.00    I understand that I am proceeding with a home sleep apnea test as ordered by my treating physician. I understand that untreated sleep apnea is a serious cardiovascular risk factor and it is my responsibility to perform the test and seek management for  sleep apnea. I will be contacted with the results and be managed for sleep apnea by a local sleep physician. I will be receiving equipment and further instructions from [facility]. I shall promptly ship back the equipment via the included mailing label. I understand my insurance will be billed for the test and as the patient I am responsible for any insurance related out-of-pocket costs incurred. I have been provided with written instructions and can call for additional video or telephonic instruction, with 24-hour availability of qualified personnel to answer any questions: Patient Help Desk [phone].  Patient Signature ______________________________________________________   Date______________________ Patient Telemedicine Verbal Consent

## 2019-12-03 NOTE — Discharge Summary (Signed)
Advanced Heart Failure Team  Discharge Summary   Patient ID: KALIL WOESSNER MRN: [id number], DOB/AGE: 1985-09-04 34 y.o. Admit date: 11/27/2019 D/C date:     12/03/2019   Primary Discharge Diagnoses:  Acute Systolic HF 2. Type 2 diabetes: New diagnosis with hgbA1c 11. 3. Suspect OSA 4. ETOH abuse  5. Obesity 6. Hypoxic Respiratory Failure  Hospital Course:  Mr Buda is a 34 year old with a history of HTN, obesity, and ETOH abuse. Says he was borderline for diabetes. Drinks heavily, vodka when he is not working. Denies drug use.   Admitted with increased dyspnea and leg edema. ECHO showed reduced EF 20-25%. Diuresed with IV lasix and transitioning to po lasix 80 mg daily. Overall diuresed 35 pounds. Had LHC/RHC that showed normal coronaries and elevated filling pressures. New diagnosed diabetes, diabetes coordinator consulted. Discharging on metformin and will follow up with PCP for management.   Pharmacy team met with him prior to discharge to discuss medications. See below for detailed problem list. He will be followed closely in the HF clinic.   1. Acute systolic CHF: Echo was difficult but EF appears to be 20-25%. Symptoms for at least 2 wks, no definite trigger. No chest pain. Cause is uncertain. However, heavy ETOH may play a role as well as HTN and untreated diabetes. Cannot rule out viral myocarditis. Cannot rule out coronary disease as he has risk factors. ECG is narrow complex so not CRT candidate. Sinus tachycardia has been concerning for possible low cardiac output but co-ox  Has been stable.  Cath showed  Normal coronaries and elevated filling pressures.  - Volume status much improved. Give one more dose IV lasix. Overall weight down 35 pounds.  - Tomorrow he will start lasix 80 mg po daily.  - Continue digoxin 0.125 daily.  - Continue spironolactone 25 mg daily.  -Continue Coreg 3.125 bid.  - Continue Entresto 24/26 bid. - Renal function stable.    - Plan to repeat  ECHO 3 months after HF meds optimized.  2. Type 2 diabetes: New diagnosis with hgbA1c 11. Sliding scale used during his hospitalization. Diabetes Coordinator consulted. Start metformin 500 mg twice a day after discharge.  Dietitian met with him. I have arranged follow up with his PCP, Dr Loney Loh for diabetes management.  3. Suspect OSA: Outpatient sleep study. We are working on setting this up.  4. ETOH abuse: Heavy drinker when not working. We discussed cutting this out, he feels like he can.  5. Obesity Body mass index is 60.49 kg/m. Discussed portion control.  6. Hypoxic Respiratory Failure O2 sats 84% at rest. Will need home oxygen. Set up 2 liters oxygen continuously  Discharge Vitals: Blood pressure 114/71, pulse 94, temperature 97.9 F (36.6 C), temperature source Oral, resp. rate 20, height 6' 2.2" (1.885 m), weight (!) 214.9 kg, SpO2 95 %.  Labs: Lab Results  Component Value Date   WBC 8.2 12/02/2019   HGB 14.6 12/02/2019   HCT 47.6 12/02/2019   MCV 90.8 12/02/2019   PLT 270 12/02/2019    Recent Labs  Lab 12/01/19 1020 12/02/19 0442 12/03/19 0311  NA 135   < > 137  K 4.1   < > 3.9  CL 87*   < > 90*  CO2 37*   < > 35*  BUN 14   < > 13  CREATININE 0.92   < > 0.88  CALCIUM 8.1*   < > 8.4*  PROT 6.8  --   --  BILITOT 2.3*  --   --   ALKPHOS 100  --   --   ALT 59*  --   --   AST 40  --   --   GLUCOSE 270*   < > 182*   < > = values in this interval not displayed.   Lab Results  Component Value Date   CHOL 122 11/28/2019   HDL 22 (L) 11/28/2019   LDLCALC 82 11/28/2019   TRIG 89 11/28/2019   BNP (last 3 results) Recent Labs    11/27/19 1317  BNP 441.1*    ProBNP (last 3 results) No results for input(s): PROBNP in the last 8760 hours.   Diagnostic Studies/Procedures   CARDIAC CATHETERIZATION  Result Date: 12/02/2019 1. Mildly elevated left and right heart filling pressures with pulmonary venous hypertension. 2. Low cardiac output (CI 2.0). 3. No  significant coronary disease, nonischemic cardiomyopathy.   DG CHEST PORT 1 VIEW  Result Date: 12/02/2019 CLINICAL DATA:  Central line placement EXAM: PORTABLE CHEST 1 VIEW COMPARISON:  11/27/2019 FINDINGS: Examination is slightly limited secondary to poor penetration related to patient body habitus. A right-sided PICC line is visualized with the distal tip appearing to terminate in the region of the right subclavian vein. Cardiomegaly is similar to prior. Prominent perihilar and bibasilar interstitial markings, which may be slightly progressed in the right lung base. No appreciable pleural fluid collection. No pneumothorax. IMPRESSION: 1. Right-sided PICC line with the distal tip appearing to terminate in the region of the right subclavian vein. 2. Cardiomegaly. Cardiac contours raise the possibility of an underlying pericardial effusion. 3. Prominent perihilar and bibasilar interstitial markings, which may be slightly progressed in the right lung base. Electronically Signed   By: Davina Poke D.O.   On: 12/02/2019 15:00    Discharge Medications   Allergies as of 12/03/2019   No Known Allergies     Medication List    STOP taking these medications   amLODipine 10 MG tablet Commonly known as: NORVASC   lisinopril 10 MG tablet Commonly known as: ZESTRIL   Potassium 95 MG Tabs     TAKE these medications   acetaminophen 500 MG tablet Commonly known as: TYLENOL Take 1,000 mg by mouth every 6 (six) hours as needed for mild pain.   carvedilol 3.125 MG tablet Commonly known as: COREG Take 1 tablet (3.125 mg total) by mouth 2 (two) times daily with a meal.   digoxin 0.125 MG tablet Commonly known as: LANOXIN Take 1 tablet (0.125 mg total) by mouth daily.   furosemide 80 MG tablet Commonly known as: Lasix Take 1 tablet (80 mg total) by mouth daily. Start taking on: December 04, 2019   magnesium oxide 400 (241.3 Mg) MG tablet Commonly known as: MAG-OX Take 1 tablet (400 mg total) by  mouth daily.   metFORMIN 500 MG tablet Commonly known as: Glucophage Take 1 tablet (500 mg total) by mouth 2 (two) times daily with a meal. Start taking on: December 04, 2019   sacubitril-valsartan 49-51 MG Commonly known as: ENTRESTO Take 1 tablet by mouth 2 (two) times daily.   spironolactone 25 MG tablet Commonly known as: ALDACTONE Take 1 tablet (25 mg total) by mouth daily.            Durable Medical Equipment  (From admission, onward)         Start     Ordered   12/03/19 0817  For home use only DME oxygen  Once  Comments: pulse oximetry is performed during ambulation or exertion, the following must be documented: . O2 sat on RA at rest 84 % . O2 sat on RA while ambulating/during exertion 87 % . O2 sat on oxygen while ambulating/during exertion 90 %  Question Answer Comment  Length of Need 6 Months   Mode or (Route) Nasal cannula   Liters per Minute 2   Frequency Continuous (stationary and portable oxygen unit needed)   Oxygen conserving device Yes   Oxygen delivery system Gas      12/03/19 0817          Disposition   The patient will be discharged in stable condition to home. Discharge Instructions    (HEART FAILURE PATIENTS) Call MD:  Anytime you have any of the following symptoms: 1) 3 pound weight gain in 24 hours or 5 pounds in 1 week 2) shortness of breath, with or without a dry hacking cough 3) swelling in the hands, feet or stomach 4) if you have to sleep on extra pillows at night in order to breathe.   Complete by: As directed    Amb Referral to Cardiac Rehabilitation   Complete by: As directed    Diagnosis: Heart Failure (see criteria below if ordering Phase II)   Heart Failure Type: Chronic Systolic & Diastolic   After initial evaluation and assessments completed: Virtual Based Care may be provided alone or in conjunction with Phase 2 Cardiac Rehab based on patient barriers.: Yes   Diet - low sodium heart healthy   Complete by: As directed     Increase activity slowly   Complete by: As directed       Follow-up Information    Deland Pretty, MD Follow up on 12/19/2019.   Specialty: Internal Medicine Why: 09:30  Contact information: [address] [place] [address] [phone]        [facility] Follow up on 12/23/2019.   Specialty: Cardiology Why: at 1000 Contact information: [address] [id number] [place] [phone]                Duration of Discharge Encounter: Greater than 35 minutes   Signed, Darrick Grinder NP-C  12/03/2019, 8:34 AM

## 2019-12-03 NOTE — Telephone Encounter (Signed)
Patient Advocate Encounter   Received notification from [employer] that prior authorization for Sherryll Burger is required.   PA submitted on CoverMyMeds Key  [id number] Status is pending   Will continue to follow.

## 2019-12-03 NOTE — Telephone Encounter (Signed)
Order, OV note, stop bang and demographics all faxed to [facility] [employer] at [phone]  

## 2019-12-03 NOTE — Telephone Encounter (Signed)
Advanced Heart Failure Patient Advocate Encounter  Prior Authorization for Sherryll Burger has been approved.    PA# [id number] Effective dates: 12/03/19 through 12/02/20  Patients co-pay is $50.00  When I attempted to sign the patient up for a $10 co-pay card, it looks like he already has one active.  Archer Asa, CPhT

## 2019-12-03 NOTE — Progress Notes (Addendum)
Patient ID: Sean Lawson, male   DOB: 1985/12/23, 34 y.o.   MRN: [id number]     Advanced Heart Failure Rounding Note  PCP-Cardiologist: Candee Furbish, MD   Subjective:    Yesterday had RHC/LHC with elevated filling pressures. Restarted on IV lasix. Negative 4.6 liters. Overall weight down 35 pounds.  Wants to go home. Denies SOB.    Objective:   Weight Range: (!) 214.9 kg Body mass index is 60.49 kg/m.   Vital Signs:   Temp:  [97.6 F (36.4 C)-98.1 F (36.7 C)] 98.1 F (36.7 C) (07/13 0439) Pulse Rate:  [78-134] 91 (07/13 0441) Resp:  [8-27] 17 (07/13 0441) BP: (93-148)/(45-108) 93/50 (07/13 0441) SpO2:  [92 %-100 %] 95 % (07/13 0441) Weight:  [214.9 kg] 214.9 kg (07/13 0441) Last BM Date: 12/01/19  Weight change: Filed Weights   12/01/19 0448 12/02/19 0421 12/03/19 0441  Weight: (!) 222.9 kg (!) 219 kg (!) 214.9 kg    Intake/Output:   Intake/Output Summary (Last 24 hours) at 12/03/2019 0756 Last data filed at 12/03/2019 0440 Gross per 24 hour  Intake 610 ml  Output 5250 ml  Net -4640 ml      Physical Exam    General:  No resp difficulty HEENT: normal Neck: supple. Difficult to assess due to body habitus. Carotids 2+ bilat; no bruits. No lymphadenopathy or thryomegaly appreciated. Cor: PMI nondisplaced. Regular rate & rhythm. No rubs, gallops or murmurs. Lungs: clear Abdomen: obese, soft, nontender, nondistended. No hepatosplenomegaly. No bruits or masses. Good bowel sounds. Extremities: no cyanosis, clubbing, rash, R and LLE trace edema. RUE PICC line.  Neuro: alert & orientedx3, cranial nerves grossly intact. moves all 4 extremities w/o difficulty. Affect pleasant   Telemetry   NSR 90s   Labs    CBC Recent Labs    12/02/19 1411 12/02/19 1915  WBC  --  8.2  HGB 15.0  15.0 14.6  HCT 44.0  44.0 47.6  MCV  --  90.8  PLT  --  409   Basic Metabolic Panel Recent Labs    12/02/19 0442 12/02/19 0442 12/02/19 1411 12/02/19 1915  12/03/19 0311  NA 136   < > 138  138  --  137  K 4.0   < > 3.8  3.7  --  3.9  CL 88*  --   --   --  90*  CO2 38*  --   --   --  35*  GLUCOSE 194*  --   --   --  182*  BUN 13  --   --   --  13  CREATININE 0.87   < >  --  0.91 0.88  CALCIUM 8.1*  --   --   --  8.4*  MG 1.7  --   --   --  1.5*   < > = values in this interval not displayed.   Liver Function Tests Recent Labs    12/01/19 1020  AST 40  ALT 59*  ALKPHOS 100  BILITOT 2.3*  PROT 6.8  ALBUMIN 2.7*   No results for input(s): LIPASE, AMYLASE in the last 72 hours. Cardiac Enzymes No results for input(s): CKTOTAL, CKMB, CKMBINDEX, TROPONINI in the last 72 hours.  BNP: BNP (last 3 results) Recent Labs    11/27/19 1317  BNP 441.1*    ProBNP (last 3 results) No results for input(s): PROBNP in the last 8760 hours.   D-Dimer No results for input(s): DDIMER in the last 72  hours. Hemoglobin A1C No results for input(s): HGBA1C in the last 72 hours. Fasting Lipid Panel No results for input(s): CHOL, HDL, LDLCALC, TRIG, CHOLHDL, LDLDIRECT in the last 72 hours. Thyroid Function Tests No results for input(s): TSH, T4TOTAL, T3FREE, THYROIDAB in the last 72 hours.  Invalid input(s): FREET3  Other results:   Imaging    CARDIAC CATHETERIZATION  Result Date: 12/02/2019 1. Mildly elevated left and right heart filling pressures with pulmonary venous hypertension. 2. Low cardiac output (CI 2.0). 3. No significant coronary disease, nonischemic cardiomyopathy.   DG CHEST PORT 1 VIEW  Result Date: 12/02/2019 CLINICAL DATA:  Central line placement EXAM: PORTABLE CHEST 1 VIEW COMPARISON:  11/27/2019 FINDINGS: Examination is slightly limited secondary to poor penetration related to patient body habitus. A right-sided PICC line is visualized with the distal tip appearing to terminate in the region of the right subclavian vein. Cardiomegaly is similar to prior. Prominent perihilar and bibasilar interstitial markings, which may  be slightly progressed in the right lung base. No appreciable pleural fluid collection. No pneumothorax. IMPRESSION: 1. Right-sided PICC line with the distal tip appearing to terminate in the region of the right subclavian vein. 2. Cardiomegaly. Cardiac contours raise the possibility of an underlying pericardial effusion. 3. Prominent perihilar and bibasilar interstitial markings, which may be slightly progressed in the right lung base. Electronically Signed   By: Davina Poke D.O.   On: 12/02/2019 15:00     Medications:     Scheduled Medications: . carvedilol  3.125 mg Oral BID WC  . Chlorhexidine Gluconate Cloth  6 each Topical Daily  . digoxin  0.125 mg Oral Daily  . enoxaparin (LOVENOX) injection  110 mg Subcutaneous Q24H  . folic acid  1 mg Oral Daily  . furosemide  80 mg Intravenous BID  . insulin aspart  0-15 Units Subcutaneous TID WC  . magnesium oxide  400 mg Oral Daily  . sacubitril-valsartan  1 tablet Oral BID  . sodium chloride flush  10-40 mL Intracatheter Q12H  . sodium chloride flush  3 mL Intravenous Q12H  . spironolactone  25 mg Oral Daily  . thiamine  100 mg Oral Daily    Infusions: . sodium chloride      PRN Medications: sodium chloride, acetaminophen, guaiFENesin-dextromethorphan, LORazepam, ondansetron (ZOFRAN) IV, sodium chloride flush, sodium chloride flush   Assessment/Plan   1. Acute systolic CHF: Echo was difficult but EF appears to be 20-25%.  Symptoms for at least 2 wks, no definite trigger.  No chest pain.  Cause is uncertain.  However, heavy ETOH may play a role as well as HTN and untreated diabetes.  Cannot rule out viral myocarditis.  Cannot rule out coronary disease as he has risk factors.  ECG is narrow complex so not CRT candidate.  Sinus tachycardia has been concerning for possible low cardiac output but co-ox  Has been stable.  Cath showed  Normal coronaries and elevated filling pressures.  - Volume status much improved. Give one more dose  IV lasix. Overall weight down 35 pounds.  - Tomorrow he will start lasix 80 mg po daily.  - Continue digoxin 0.125 daily.  - Continue spironolactone 25 mg daily.  - Continue Coreg 3.125 bid.  - Continue Entresto 49/51 bid. - Renal function stable.    - Plan to repeat ECHO 3 months after HF meds optimized.  2. Type 2 diabetes: New diagnosis with hgbA1c 11.  Diabetes Coordinator consulted.  Dietitian met with him. I have arranged follow up with  his PCP, Dr Loney Loh for diabetes management.  - He will start metformin 500 mg bid going home.  Eventually SGLT2 inhibitor needed.  3. Suspect OSA: Outpatient sleep study. We are working on setting this up.  4. ETOH abuse: Heavy drinker when not working.  We discussed cutting this out, he feels like he can.  5. Obesity Body mass index is 60.49 kg/m. 6. Hypoxic Respiratory Failure O2 sats 84% at rest. Will need home oxygen. Set up 2 liters oxygen continuously.   Meds sent to [facility].Pharmacy.   TOC ordered for home oxygen.   HF follow up set up. Pharmacy to follow up for med education.   Length of Stay: 5  Amy Clegg, NP  12/03/2019, 7:56 AM  Advanced Heart Failure Team Pager [phone] (M-F; 7a - 4p)  Please contact [facility] for night-coverage after hours (4p -7a ) and weekends on [email or web address]  Patient seen with NP, agree with the above note.    He diuresed well yesterday, weight down another 9 lbs. Creatinine stable.  He is ready to go home.    We will let him go home today on the current cardiac meds.  Will continue Lasix 80 mg po daily for gradual diuresis.  Currently requiring oxygen so will send home with this, hopefully will be able to stop this at followup appt with further diuresis.  Will arrange for home sleep study.  He has a new diagnosis of diabetes, will start metformin 500 mg bid and have set up appt with his PCP to manage.  We will see back in the office in 10-14 days, continue to titrate cardiomyopathy meds.    Loralie Champagne 12/03/2019 9:22 AM

## 2019-12-12 ENCOUNTER — Inpatient Hospital Stay (HOSPITAL_COMMUNITY)
Admission: EM | Admit: 2019-12-12 | Discharge: 2019-12-17 | DRG: 175 | Disposition: A | Discharge status: Home / Self Care | Payer: 59 | Source: Ambulatory Visit | Attending: Internal Medicine | Admitting: Internal Medicine

## 2019-12-12 ENCOUNTER — Other Ambulatory Visit: Payer: Self-pay

## 2019-12-12 ENCOUNTER — Emergency Department (HOSPITAL_COMMUNITY): Payer: 59

## 2019-12-12 DIAGNOSIS — G8929 Other chronic pain: Secondary | ICD-10-CM | POA: Diagnosis present

## 2019-12-12 DIAGNOSIS — M545 Low back pain: Secondary | ICD-10-CM | POA: Diagnosis present

## 2019-12-12 DIAGNOSIS — Z833 Family history of diabetes mellitus: Secondary | ICD-10-CM

## 2019-12-12 DIAGNOSIS — E1165 Type 2 diabetes mellitus with hyperglycemia: Secondary | ICD-10-CM | POA: Diagnosis present

## 2019-12-12 DIAGNOSIS — I2699 Other pulmonary embolism without acute cor pulmonale: Secondary | ICD-10-CM | POA: Diagnosis not present

## 2019-12-12 DIAGNOSIS — R05 Cough: Secondary | ICD-10-CM

## 2019-12-12 DIAGNOSIS — I110 Hypertensive heart disease with heart failure: Secondary | ICD-10-CM | POA: Diagnosis present

## 2019-12-12 DIAGNOSIS — Z20822 Contact with and (suspected) exposure to covid-19: Secondary | ICD-10-CM | POA: Diagnosis present

## 2019-12-12 DIAGNOSIS — I10 Essential (primary) hypertension: Secondary | ICD-10-CM | POA: Diagnosis present

## 2019-12-12 DIAGNOSIS — Z8249 Family history of ischemic heart disease and other diseases of the circulatory system: Secondary | ICD-10-CM

## 2019-12-12 DIAGNOSIS — I5022 Chronic systolic (congestive) heart failure: Secondary | ICD-10-CM | POA: Diagnosis present

## 2019-12-12 DIAGNOSIS — Z7984 Long term (current) use of oral hypoglycemic drugs: Secondary | ICD-10-CM

## 2019-12-12 DIAGNOSIS — J9601 Acute respiratory failure with hypoxia: Secondary | ICD-10-CM | POA: Diagnosis present

## 2019-12-12 DIAGNOSIS — I5023 Acute on chronic systolic (congestive) heart failure: Secondary | ICD-10-CM | POA: Diagnosis present

## 2019-12-12 DIAGNOSIS — E871 Hypo-osmolality and hyponatremia: Secondary | ICD-10-CM | POA: Diagnosis present

## 2019-12-12 DIAGNOSIS — F1010 Alcohol abuse, uncomplicated: Secondary | ICD-10-CM | POA: Diagnosis present

## 2019-12-12 DIAGNOSIS — R059 Cough, unspecified: Secondary | ICD-10-CM

## 2019-12-12 DIAGNOSIS — Z6841 Body Mass Index (BMI) 40.0 and over, adult: Secondary | ICD-10-CM

## 2019-12-12 DIAGNOSIS — Z79899 Other long term (current) drug therapy: Secondary | ICD-10-CM

## 2019-12-12 DIAGNOSIS — N179 Acute kidney failure, unspecified: Secondary | ICD-10-CM | POA: Diagnosis present

## 2019-12-12 DIAGNOSIS — K219 Gastro-esophageal reflux disease without esophagitis: Secondary | ICD-10-CM | POA: Diagnosis present

## 2019-12-12 HISTORY — DX: Type 2 diabetes mellitus without complications: E11.9

## 2019-12-12 LAB — CBC WITH DIFFERENTIAL/PLATELET
Abs Immature Granulocytes: 0.05 10*3/uL (ref 0.00–0.07)
Basophils Absolute: 0.1 10*3/uL (ref 0.0–0.1)
Basophils Relative: 1 %
Eosinophils Absolute: 0.2 10*3/uL (ref 0.0–0.5)
Eosinophils Relative: 2 %
HCT: 48.1 % (ref 39.0–52.0)
Hemoglobin: 15.3 g/dL (ref 13.0–17.0)
Immature Granulocytes: 1 %
Lymphocytes Relative: 14 %
Lymphs Abs: 1.4 10*3/uL (ref 0.7–4.0)
MCH: 27.6 pg (ref 26.0–34.0)
MCHC: 31.8 g/dL (ref 30.0–36.0)
MCV: 86.7 fL (ref 80.0–100.0)
Monocytes Absolute: 0.9 10*3/uL (ref 0.1–1.0)
Monocytes Relative: 9 %
Neutro Abs: 7.3 10*3/uL (ref 1.7–7.7)
Neutrophils Relative %: 73 %
Platelets: 420 10*3/uL — ABNORMAL HIGH (ref 150–400)
RBC: 5.55 MIL/uL (ref 4.22–5.81)
RDW: 13.8 % (ref 11.5–15.5)
WBC: 10 10*3/uL (ref 4.0–10.5)
nRBC: 0 % (ref 0.0–0.2)

## 2019-12-12 LAB — BASIC METABOLIC PANEL
Anion gap: 11 (ref 5–15)
BUN: 26 mg/dL — ABNORMAL HIGH (ref 6–20)
CO2: 29 mmol/L (ref 22–32)
Calcium: 9.4 mg/dL (ref 8.9–10.3)
Chloride: 88 mmol/L — ABNORMAL LOW (ref 98–111)
Creatinine, Ser: 1.13 mg/dL (ref 0.61–1.24)
GFR calc Af Amer: >60 mL/min (ref >60)
GFR calc non Af Amer: >60 mL/min (ref >60)
Glucose, Bld: 292 mg/dL — ABNORMAL HIGH (ref 70–99)
Potassium: 4.9 mmol/L (ref 3.5–5.1)
Sodium: 128 mmol/L — ABNORMAL LOW (ref 135–145)

## 2019-12-12 NOTE — ED Provider Notes (Signed)
[facility] EMERGENCY DEPARTMENT Provider Note   CSN: [id number] Arrival date & time: 12/12/19  1612     History Chief Complaint  Patient presents with  . Pneumonia    Sean Lawson is a 34 y.o. male with a history of DM Type II, HTN, morbid obesity, congestive heart failure, suspected OSA, and alcohol use disorder who presents the emergency department with a chief complaint of cough.  He has had a cough for the last 3 weeks.  Cough is nonproductive.  He reports that it progressively increases in frequency throughout the day.  He denies shortness of breath, chest pain, chest tightness, wheezing, palpitations, fever, chills, nasal congestion, rhinorrhea, postnasal drip, itchy, watery eyes, sore throat, sinus pain or pressure, burping or belching, abdominal pain, nausea, vomiting, or diarrhea.  Prior to his recent admission, he was on lisinopril for hypertension, but was only intermittently taken the medication.  The patient was following up from his recent discharge from the hospital with his PCP earlier today to recheck his kidney function before his diabetes medications were adjusted.  A chest x-ray was performed during the visit, which was concerning for pneumonia, and the patient was advised to come to the ER for IV antibiotics to treat pneumonia.  The patient was recently admitted from July 7-13 with new onset diabetes mellitus type 2.  He had an echo that showed an EF of 20 to 25% and had 35 pounds diuresed during his hospitalization.  He underwent left and right heart cardiac catheterization that showed normal coronaries and elevated filling pressure.  He was discharged on Metformin.  He was discharged with home oxygen to wear as needed, mostly at night.    He reports that during his hospitalization that his oxygen levels would intermittently drop into the upper 80s, but would quickly return to the 90s.  He has been wearing a pulse oximeter at home since discharged  and oxygen has remained above 90%.  He is a never smoker.  He drinks alcohol.  He denies other illicit or recreational substance use.  He was started on Coreg, Entresto, Aldactone, Lasix, and digoxin during his most recent admission, but he reports that cough has not worsened since starting medication.  The history is provided by the patient and medical records. No language interpreter was used.       Past Medical History:  Diagnosis Date  . Diabetes mellitus without complication (HCC)   . Hypertension     Patient Active Problem List   Diagnosis Date Noted  . Acute pulmonary embolism (HCC) 12/13/2019  . Heart failure (HCC) 11/28/2019  . Morbid obesity (HCC) 11/28/2019  . Hypertension 11/28/2019  . Fluid overload 11/27/2019    Past Surgical History:  Procedure Laterality Date  . RIGHT/LEFT HEART CATH AND CORONARY ANGIOGRAPHY N/A 12/02/2019   Procedure: RIGHT/LEFT HEART CATH AND CORONARY ANGIOGRAPHY;  Surgeon: Laurey Morale, MD;  Location: [facility] INVASIVE CV LAB;  Service: Cardiovascular;  Laterality: N/A;       Family History  Problem Relation Age of Onset  . Diabetes Mellitus II Mother   . Heart failure Maternal Grandmother   . Heart disease Maternal Grandfather     Social History   Tobacco Use  . Smoking status: Never Smoker  . Smokeless tobacco: Former Neurosurgeon    Types: Snuff  Vaping Use  . Vaping Use: Never used  Substance Use Topics  . Alcohol use: Yes    Alcohol/week: 30.0 standard drinks    Types: 30  Shots of liquor per week    Comment: weekly  . Drug use: Never    Home Medications Prior to Admission medications   Medication Sig Start Date End Date Taking? Authorizing Provider  carvedilol (COREG) 3.125 MG tablet Take 1 tablet (3.125 mg total) by mouth 2 (two) times daily with a meal. 12/03/19  Yes Clegg, Amy D, NP  digoxin (LANOXIN) 0.125 MG tablet Take 1 tablet (0.125 mg total) by mouth daily. 12/03/19  Yes Clegg, Amy D, NP  furosemide (LASIX) 80 MG tablet  Take 1 tablet (80 mg total) by mouth daily. 12/04/19 12/03/20 Yes Clegg, Amy D, NP  magnesium oxide (MAG-OX) 400 (241.3 Mg) MG tablet Take 1 tablet (400 mg total) by mouth daily. 12/03/19  Yes Clegg, Amy D, NP  metFORMIN (GLUCOPHAGE) 500 MG tablet Take 1 tablet (500 mg total) by mouth 2 (two) times daily with a meal. 12/04/19 12/03/20 Yes Clegg, Amy D, NP  sacubitril-valsartan (ENTRESTO) 49-51 MG Take 1 tablet by mouth 2 (two) times daily. 12/03/19  Yes Clegg, Amy D, NP  spironolactone (ALDACTONE) 25 MG tablet Take 1 tablet (25 mg total) by mouth daily. 12/03/19  Yes Clegg, Amy D, NP  acetaminophen (TYLENOL) 500 MG tablet Take 1,000 mg by mouth every 6 (six) hours as needed for mild pain. Patient not taking: Reported on 12/12/2019    [provider]    Allergies    Patient has no known allergies.  Review of Systems   Review of Systems  Constitutional: Negative for appetite change, chills, diaphoresis and fever.  HENT: Negative for congestion, drooling, ear discharge, ear pain, facial swelling, hearing loss, nosebleeds, sinus pressure, sinus pain, sore throat and tinnitus.   Eyes: Negative for visual disturbance.  Respiratory: Positive for cough. Negative for choking, shortness of breath and wheezing.   Cardiovascular: Negative for chest pain, palpitations and leg swelling.  Gastrointestinal: Negative for abdominal pain, blood in stool, diarrhea, nausea and vomiting.  Genitourinary: Negative for dysuria.  Musculoskeletal: Negative for back pain, gait problem, myalgias, neck pain and neck stiffness.  Skin: Negative for rash.  Allergic/Immunologic: Negative for immunocompromised state.  Neurological: Negative for dizziness, seizures, syncope, weakness, numbness and headaches.  Psychiatric/Behavioral: Negative for confusion.    Physical Exam Updated Vital Signs BP (!) 143/101   Pulse 98   Temp 97.8 F (36.6 C) (Oral)   Resp 18   Ht 6\' 3"  (1.905 m)   Wt (!) 197.8 kg   SpO2 91%   BMI  54.50 kg/m   Physical Exam Vitals and nursing note reviewed.  Constitutional:      General: He is not in acute distress.    Appearance: He is well-developed. He is obese. He is not ill-appearing, toxic-appearing or diaphoretic.  HENT:     Head: Normocephalic.     Mouth/Throat:     Mouth: Mucous membranes are moist.  Eyes:     Conjunctiva/sclera: Conjunctivae normal.  Cardiovascular:     Rate and Rhythm: Regular rhythm.     Heart sounds: No murmur heard.      Comments: Borderline tachycardia Pulmonary:     Effort: Pulmonary effort is normal.     Breath sounds: No stridor. No wheezing, rhonchi or rales.     Comments: No increased work of breathing, retractions, accessory muscle use. Chest:     Chest wall: No tenderness.  Abdominal:     General: There is no distension.     Palpations: Abdomen is soft. There is no mass.  Tenderness: There is no abdominal tenderness. There is no right CVA tenderness, left CVA tenderness, guarding or rebound.     Hernia: No hernia is present.  Musculoskeletal:     Cervical back: Neck supple.     Comments: Trace peripheral edema  Skin:    General: Skin is warm.     Capillary Refill: Capillary refill takes less than 2 seconds.     Coloration: Skin is not pale.     Findings: No erythema.  Neurological:     Mental Status: He is alert.  Psychiatric:        Behavior: Behavior normal.     ED Results / Procedures / Treatments   Labs (all labs ordered are listed, but only abnormal results are displayed) Labs Reviewed  CBC WITH DIFFERENTIAL/PLATELET - Abnormal; Notable for the following components:      Result Value   Platelets 420 (*)    All other components within normal limits  BASIC METABOLIC PANEL - Abnormal; Notable for the following components:   Sodium 128 (*)    Chloride 88 (*)    Glucose, Bld 292 (*)    BUN 26 (*)    All other components within normal limits  D-DIMER, QUANTITATIVE (NOT AT [facility]) - Abnormal; Notable for the  following components:   D-Dimer, Quant 6.61 (*)    All other components within normal limits  TROPONIN I (HIGH SENSITIVITY) - Abnormal; Notable for the following components:   Troponin I (High Sensitivity) 22 (*)    All other components within normal limits  SARS CORONAVIRUS 2 BY RT PCR (HOSPITAL ORDER, PERFORMED IN [facility])  PROTIME-INR  APTT  HEPARIN LEVEL (UNFRACTIONATED)  HEPARIN LEVEL (UNFRACTIONATED)    EKG EKG Interpretation  Date/Time:  Thursday December 12 2019 22:51:56 EDT Ventricular Rate:  101 PR Interval:    QRS Duration: 109 QT Interval:  354 QTC Calculation: 459 R Axis:   -91 Text Interpretation: Sinus tachycardia Left anterior fascicular block Low voltage, precordial leads Probable anteroseptal infarct, old since last tracing no significant change Confirmed by Eber Hong ([id number]) on 12/12/2019 11:06:33 PM   Radiology DG Chest 2 View  Result Date: 12/12/2019 CLINICAL DATA:  Cough, pneumonia EXAM: CHEST - 2 VIEW COMPARISON:  12/12/2019 at 11:45 a.m., 12/02/2019 FINDINGS: The lungs are well expanded and are symmetric. Streaky bibasilar pulmonary infiltrates are present, in keeping with atypical infection or aspiration in the appropriate clinical setting. No pneumothorax or pleural effusion. Cardiac size within normal limits. Pulmonary vascularity is normal. IMPRESSION: Streaky bibasilar pulmonary infiltrates, infection versus aspiration. Electronically Signed   By: Helyn Numbers MD   On: 12/12/2019 17:42   CT Angio Chest PE W and/or Wo Contrast  Result Date: 12/13/2019 CLINICAL DATA:  PE suspected.  Cough. EXAM: CT ANGIOGRAPHY CHEST WITH CONTRAST TECHNIQUE: Multidetector CT imaging of the chest was performed using the standard protocol during bolus administration of intravenous contrast. Multiplanar CT image reconstructions and MIPs were obtained to evaluate the vascular anatomy. CONTRAST:  OMNIPAQUE IOHEXOL 350 MG/ML SOLN COMPARISON:  None.  FINDINGS: Cardiovascular: Contrast injection is sufficient to demonstrate satisfactory opacification of the pulmonary arteries to the segmental level. There are acute bilateral pulmonary emboli. On the right, there is an acute, near occlusive thrombus involving the main right pulmonary artery extending into the lobar branches of the right lower lobe, right middle lobe, and right upper lobe. On the left, there are acute, near occlusive pulmonary emboli involving the left lower lobe and left upper  lobe in a lobar, segmental, and subsegmental distribution. The heart size is significantly enlarged. There is borderline right heart strain with the RV/LV ratio measuring approximately 0.9. There is no evidence for thoracic aortic dissection or aneurysm. No significant pericardial effusion. Mediastinum/Nodes: -- No mediastinal lymphadenopathy. -- No hilar lymphadenopathy. -- No axillary lymphadenopathy. -- No supraclavicular lymphadenopathy. -- Normal thyroid gland where visualized. -  Unremarkable esophagus. Lungs/Pleura: There is no pneumothorax. There are pulmonary opacities at the lung bases bilaterally favored to represent areas of pulmonary infarction. There is no significant pleural effusion. Upper Abdomen: Contrast bolus timing is not optimized for evaluation of the abdominal organs. There appears to be underlying hepatic steatosis. Musculoskeletal: No chest wall abnormality. No bony spinal canal stenosis. Review of the MIP images confirms the above findings. IMPRESSION: 1. Extensive acute bilateral pulmonary emboli as detailed above with CT evidence for borderline right-sided heart strain with the RV/LV ratio measuring approximately 0.9. 2. Cardiomegaly. 3. Bibasilar airspace opacities favored to represent pulmonary infarcts in the setting of acute pulmonary emboli. Multifocal pneumonia is felt to be less likely. 4. Questionable hepatic steatosis. These results were called by telephone at the time of interpretation  on 12/13/2019 at 1:29 am to provider Olisa Quesnel [facility] , who verbally acknowledged these results. Electronically Signed   By: Katherine Mantle M.D.   On: 12/13/2019 01:34    Procedures .Critical Care Performed by: Barkley Boards, PA-C Authorized by: Barkley Boards, PA-C   Critical care provider statement:    Critical care time (minutes):  45   Critical care time was exclusive of:  Separately billable procedures and treating other patients and teaching time   Critical care was necessary to treat or prevent imminent or life-threatening deterioration of the following conditions:  Respiratory failure and cardiac failure   Critical care was time spent personally by me on the following activities:  Ordering and performing treatments and interventions, ordering and review of laboratory studies, ordering and review of radiographic studies, re-evaluation of patient's condition, pulse oximetry, obtaining history from patient or surrogate, examination of patient, evaluation of patient's response to treatment, development of treatment plan with patient or surrogate, discussions with consultants and review of old charts   I assumed direction of critical care for this patient from another provider in my specialty: no     (including critical care time)  Medications Ordered in ED Medications  heparin ADULT infusion 100 units/mL (25000 units/258mL sodium chloride 0.45%) (2,200 Units/hr Intravenous New Bag/Given 12/13/19 0205)  iohexol (OMNIPAQUE) 350 MG/ML injection 100 mL (100 mLs Intravenous Contrast Given 12/13/19 0112)  heparin bolus via infusion 6,000 Units (6,000 Units Intravenous Bolus from Bag 12/13/19 0207)    ED Course  I have reviewed the triage vital signs and the nursing notes.  Pertinent labs & imaging results that were available during my care of the patient were reviewed by me and considered in my medical decision making (see chart for details).  Clinical Course as of Dec 12 233  Fri Dec 13, 2019  0234 Placed on 1.5 L of oxygen for comfort as patient is going to attempt to get some sleep and he has questionable underlying OSA and was previously discharged home on supplemental O2 to wear at night.   [MM]    Clinical Course User Index [MM] Banesa Tristan, Coral Else, PA-C   MDM Rules/Calculators/A&P  34 year old male with a history of DM Type II, HTN, morbid obesity, congestive heart failure, suspected OSA, and alcohol use disorder presenting with complaints of a nonproductive cough for 3 weeks.  Mild tachycardia.  Oxygen saturation has fluctuated between 87% and at the mid 90s since he arrived in the ER.  Mildly hypertensive.  Afebrile.  No tachypnea.  The patient was independently seen and evaluated by Dr. Hyacinth Meeker, attending physician.  He had a recent admission for a week with acute congestive heart failure and new diagnosis of diabetes mellitus type 2.  He was following up with his PCP today to have labs rechecked to adjust his antihyperglycemics when he had a repeat chest x-ray that was concerning for pneumonia and he was advised to return to the emergency department for re-evaluation and IV antibiotics.   Presentation does not seem consistent with pneumonia, but patient did have recent hospitalization and was recently diagnosed as a type II diabetic with an A1c of 11.  Chest x-ray with bibasilar infiltrates.  Previous chest x-ray and current x-ray have been reviewed by me.  Current chest x-ray is markedly improved from previous.  His CBC demonstrates a borderline leukocytosis, but there is no left shift.  Appears hemoconcentrated compared to previous, likely secondary to diuresis during patient's recent admission as he was down 35 pounds at discharge.  Now reportedly down 71 pounds from initial admission.  He does have mild hyponatremia, corrected sodium is 133, also likely secondary to recent diuresis.  EKG unchanged from previous.  No significant metabolic  derangements.  However, with intermittent hypoxia, patient is not PERC negative from recent admission.  D-dimer was obtained, markedly elevated at 6.61 and PE study with extensive acute bilateral pulmonary emboli with borderline right heart strain.  The bibasilar opacities seen on chest x-ray are favored to represent pulmonary infarcts.  PE results discussed with radiology.  Less likely medical focal pneumonia.  We will start the patient on heparin and he will require admission.  We will add on troponin, which was not initially obtained as the patient did not have any complaints of chest pain or shortness of breath, and coagulation studies. COVID-19 swab has been ordered.  Consult to the hospitalist team and Dr. Toniann Fail will accept the patient for admission.  Updated family on admission status and patient was placed on 1.5 L of nasal cannula for comfort so he can get some sleep.  The patient appears reasonably stabilized for admission considering the current resources, flow, and capabilities available in the ED at this time, and I doubt any other [facility] requiring further screening and/or treatment in the ED prior to admission.  Final Clinical Impression(s) / ED Diagnoses Final diagnoses:  Bilateral pulmonary embolism [facility])    Rx / DC Orders ED Discharge Orders    None       Barkley Boards, PA-C 12/13/19 0235    Eber Hong, MD 12/14/19 1456

## 2019-12-12 NOTE — ED Provider Notes (Signed)
This patient is a very obese 34 year old male, recently admitted with "hypervolemia" likely congestive heart failure, nonischemic cardiomyopathy by heart catheterization and an echocardiogram showing 20 to 25% ejection fraction, there is mild pulmonary hypertension.  Patient presents today with increasing cough, infiltrate seen at the bases, on exam the patient is coughing frequently in the room, otherwise no distress, intermittent episodes of hypoxia.  Mild tachycardia, normotensive.  Sent here for evaluation and likely admission for possible pneumonia.  I have looked at the chest x-ray myself and do see some infiltrates at the base on the lateral films especially.  Will likely need evaluation for possible pulmonary embolism as well given recent admission, relative immobilization, morbid obesity, hypoxia out of proportion to radiographic findings.   EKG Interpretation  Date/Time:  Thursday December 12 2019 22:51:56 EDT Ventricular Rate:  101 PR Interval:    QRS Duration: 109 QT Interval:  354 QTC Calculation: 459 R Axis:   -91 Text Interpretation: Sinus tachycardia Left anterior fascicular block Low voltage, precordial leads Probable anteroseptal infarct, old since last tracing no significant change Confirmed by Eber Hong ([id number]) on 12/12/2019 11:06:33 PM       Medical screening examination/treatment/procedure(s) were conducted as a shared visit with non-physician practitioner(s) and myself.  I personally evaluated the patient during the encounter.  Clinical Impression:   Final diagnoses:  Bilateral pulmonary embolism (HCC)         Eber Hong, MD 12/14/19 [phone]

## 2019-12-12 NOTE — ED Triage Notes (Signed)
Pt here sent from PCP for IV abx for pneumonia that was dx today. Has been coughing for three weeks, denies shob.

## 2019-12-13 ENCOUNTER — Inpatient Hospital Stay (HOSPITAL_COMMUNITY): Payer: 59

## 2019-12-13 ENCOUNTER — Telehealth (HOSPITAL_COMMUNITY): Payer: Self-pay | Admitting: *Deleted

## 2019-12-13 ENCOUNTER — Encounter (HOSPITAL_COMMUNITY): Payer: Self-pay

## 2019-12-13 ENCOUNTER — Emergency Department (HOSPITAL_COMMUNITY): Payer: 59

## 2019-12-13 DIAGNOSIS — R609 Edema, unspecified: Secondary | ICD-10-CM

## 2019-12-13 DIAGNOSIS — I2699 Other pulmonary embolism without acute cor pulmonale: Principal | ICD-10-CM

## 2019-12-13 DIAGNOSIS — I110 Hypertensive heart disease with heart failure: Secondary | ICD-10-CM | POA: Diagnosis present

## 2019-12-13 DIAGNOSIS — Z79899 Other long term (current) drug therapy: Secondary | ICD-10-CM | POA: Diagnosis not present

## 2019-12-13 DIAGNOSIS — Z6841 Body Mass Index (BMI) 40.0 and over, adult: Secondary | ICD-10-CM | POA: Diagnosis not present

## 2019-12-13 DIAGNOSIS — Z7984 Long term (current) use of oral hypoglycemic drugs: Secondary | ICD-10-CM | POA: Diagnosis not present

## 2019-12-13 DIAGNOSIS — I5022 Chronic systolic (congestive) heart failure: Secondary | ICD-10-CM | POA: Diagnosis not present

## 2019-12-13 DIAGNOSIS — M545 Low back pain: Secondary | ICD-10-CM | POA: Diagnosis present

## 2019-12-13 DIAGNOSIS — E1165 Type 2 diabetes mellitus with hyperglycemia: Secondary | ICD-10-CM | POA: Diagnosis present

## 2019-12-13 DIAGNOSIS — K219 Gastro-esophageal reflux disease without esophagitis: Secondary | ICD-10-CM | POA: Diagnosis present

## 2019-12-13 DIAGNOSIS — I2602 Saddle embolus of pulmonary artery with acute cor pulmonale: Secondary | ICD-10-CM

## 2019-12-13 DIAGNOSIS — Z833 Family history of diabetes mellitus: Secondary | ICD-10-CM | POA: Diagnosis not present

## 2019-12-13 DIAGNOSIS — I10 Essential (primary) hypertension: Secondary | ICD-10-CM | POA: Diagnosis not present

## 2019-12-13 DIAGNOSIS — G8929 Other chronic pain: Secondary | ICD-10-CM | POA: Diagnosis present

## 2019-12-13 DIAGNOSIS — F1010 Alcohol abuse, uncomplicated: Secondary | ICD-10-CM | POA: Diagnosis present

## 2019-12-13 DIAGNOSIS — Z8249 Family history of ischemic heart disease and other diseases of the circulatory system: Secondary | ICD-10-CM | POA: Diagnosis not present

## 2019-12-13 DIAGNOSIS — Z20822 Contact with and (suspected) exposure to covid-19: Secondary | ICD-10-CM | POA: Diagnosis present

## 2019-12-13 DIAGNOSIS — I5023 Acute on chronic systolic (congestive) heart failure: Secondary | ICD-10-CM | POA: Diagnosis present

## 2019-12-13 DIAGNOSIS — N179 Acute kidney failure, unspecified: Secondary | ICD-10-CM | POA: Diagnosis present

## 2019-12-13 DIAGNOSIS — J9601 Acute respiratory failure with hypoxia: Secondary | ICD-10-CM | POA: Diagnosis present

## 2019-12-13 DIAGNOSIS — E871 Hypo-osmolality and hyponatremia: Secondary | ICD-10-CM | POA: Diagnosis present

## 2019-12-13 HISTORY — DX: Chronic systolic (congestive) heart failure: I50.22

## 2019-12-13 LAB — ECHOCARDIOGRAM LIMITED
Area-P 1/2: 6.27 cm2
Height: 75 in
S' Lateral: 5.3 cm
Weight: 6976 oz

## 2019-12-13 LAB — CBC
HCT: 45.3 % (ref 39.0–52.0)
Hemoglobin: 14.2 g/dL (ref 13.0–17.0)
MCH: 26.9 pg (ref 26.0–34.0)
MCHC: 31.3 g/dL (ref 30.0–36.0)
MCV: 86 fL (ref 80.0–100.0)
Platelets: 332 10*3/uL (ref 150–400)
RBC: 5.27 MIL/uL (ref 4.22–5.81)
RDW: 13.9 % (ref 11.5–15.5)
WBC: 10 10*3/uL (ref 4.0–10.5)
nRBC: 0 % (ref 0.0–0.2)

## 2019-12-13 LAB — PROTIME-INR
INR: 1.2 (ref 0.8–1.2)
Prothrombin Time: 14.8 seconds (ref 11.4–15.2)

## 2019-12-13 LAB — SARS CORONAVIRUS 2 BY RT PCR (HOSPITAL ORDER, PERFORMED IN ~~LOC~~ HOSPITAL LAB): SARS Coronavirus 2: NEGATIVE

## 2019-12-13 LAB — HEPARIN LEVEL (UNFRACTIONATED)
Heparin Unfractionated: <0.1 IU/mL — ABNORMAL LOW (ref 0.30–0.70)
Heparin Unfractionated: 0.19 IU/mL — ABNORMAL LOW (ref 0.30–0.70)
Heparin Unfractionated: 0.38 IU/mL (ref 0.30–0.70)

## 2019-12-13 LAB — CBG MONITORING, ED
Glucose-Capillary: 210 mg/dL — ABNORMAL HIGH (ref 70–99)
Glucose-Capillary: 256 mg/dL — ABNORMAL HIGH (ref 70–99)
Glucose-Capillary: 197 mg/dL — ABNORMAL HIGH (ref 70–99)
Glucose-Capillary: 209 mg/dL — ABNORMAL HIGH (ref 70–99)

## 2019-12-13 LAB — BASIC METABOLIC PANEL
Anion gap: 11 (ref 5–15)
BUN: 33 mg/dL — ABNORMAL HIGH (ref 6–20)
CO2: 28 mmol/L (ref 22–32)
Calcium: 9.3 mg/dL (ref 8.9–10.3)
Chloride: 92 mmol/L — ABNORMAL LOW (ref 98–111)
Creatinine, Ser: 1.39 mg/dL — ABNORMAL HIGH (ref 0.61–1.24)
GFR calc Af Amer: >60 mL/min (ref >60)
GFR calc non Af Amer: >60 mL/min (ref >60)
Glucose, Bld: 225 mg/dL — ABNORMAL HIGH (ref 70–99)
Potassium: 4.7 mmol/L (ref 3.5–5.1)
Sodium: 131 mmol/L — ABNORMAL LOW (ref 135–145)

## 2019-12-13 LAB — D-DIMER, QUANTITATIVE: D-Dimer, Quant: 6.61 ug/mL-FEU — ABNORMAL HIGH (ref 0.00–0.50)

## 2019-12-13 LAB — APTT: aPTT: 30 seconds (ref 24–36)

## 2019-12-13 LAB — TROPONIN I (HIGH SENSITIVITY)
Troponin I (High Sensitivity): 22 ng/L — ABNORMAL HIGH (ref <18)
Troponin I (High Sensitivity): 22 ng/L — ABNORMAL HIGH (ref <18)
Troponin I (High Sensitivity): 23 ng/L — ABNORMAL HIGH (ref <18)

## 2019-12-13 MED ORDER — SACUBITRIL-VALSARTAN 49-51 MG PO TABS
1.0000 | ORAL_TABLET | Freq: Two times a day (BID) | ORAL | Status: DC
  Administered 2019-12-13: 1 via ORAL
  Filled 2019-12-13 (×2): qty 1

## 2019-12-13 MED ORDER — HEPARIN BOLUS VIA INFUSION
2000.0000 [IU] | Freq: Once | INTRAVENOUS | Status: AC
  Administered 2019-12-13: 2000 [IU] via INTRAVENOUS
  Filled 2019-12-13: qty 2000

## 2019-12-13 MED ORDER — IOHEXOL 350 MG/ML SOLN
100.0000 mL | Freq: Once | INTRAVENOUS | Status: AC | PRN
  Administered 2019-12-13: 100 mL via INTRAVENOUS

## 2019-12-13 MED ORDER — CARVEDILOL 3.125 MG PO TABS
3.1250 mg | ORAL_TABLET | Freq: Two times a day (BID) | ORAL | Status: DC
  Administered 2019-12-13 – 2019-12-17 (×9): 3.125 mg via ORAL
  Filled 2019-12-13 (×9): qty 1

## 2019-12-13 MED ORDER — SPIRONOLACTONE 25 MG PO TABS
25.0000 mg | ORAL_TABLET | Freq: Every day | ORAL | Status: DC
  Administered 2019-12-13: 25 mg via ORAL
  Filled 2019-12-13: qty 1

## 2019-12-13 MED ORDER — DIGOXIN 125 MCG PO TABS
0.1250 mg | ORAL_TABLET | Freq: Every day | ORAL | Status: DC
  Administered 2019-12-13 – 2019-12-17 (×5): 0.125 mg via ORAL
  Filled 2019-12-13 (×5): qty 1

## 2019-12-13 MED ORDER — HEPARIN BOLUS VIA INFUSION
6000.0000 [IU] | Freq: Once | INTRAVENOUS | Status: AC
  Administered 2019-12-13: 6000 [IU] via INTRAVENOUS
  Filled 2019-12-13: qty 6000

## 2019-12-13 MED ORDER — ONDANSETRON HCL 4 MG PO TABS: 4.0000 mg | ORAL_TABLET | Freq: Four times a day (QID) | ORAL | Status: DC | PRN

## 2019-12-13 MED ORDER — INSULIN ASPART 100 UNIT/ML ~~LOC~~ SOLN
0.0000 [IU] | Freq: Three times a day (TID) | SUBCUTANEOUS | Status: DC
  Administered 2019-12-13 (×2): 3 [IU] via SUBCUTANEOUS
  Administered 2019-12-13: 5 [IU] via SUBCUTANEOUS
  Administered 2019-12-13: 2 [IU] via SUBCUTANEOUS
  Administered 2019-12-14: 3 [IU] via SUBCUTANEOUS
  Administered 2019-12-14: 5 [IU] via SUBCUTANEOUS
  Administered 2019-12-14: 7 [IU] via SUBCUTANEOUS
  Administered 2019-12-15: 5 [IU] via SUBCUTANEOUS
  Administered 2019-12-15 – 2019-12-16 (×3): 3 [IU] via SUBCUTANEOUS

## 2019-12-13 MED ORDER — ONDANSETRON HCL 4 MG/2ML IJ SOLN: 4.0000 mg | Freq: Four times a day (QID) | INTRAMUSCULAR | Status: DC | PRN

## 2019-12-13 MED ORDER — FUROSEMIDE 80 MG PO TABS
80.0000 mg | ORAL_TABLET | Freq: Every day | ORAL | Status: DC
  Administered 2019-12-13 – 2019-12-17 (×5): 80 mg via ORAL
  Filled 2019-12-13 (×4): qty 1
  Filled 2019-12-13: qty 4

## 2019-12-13 MED ORDER — HEPARIN (PORCINE) 25000 UT/250ML-% IV SOLN
2550.0000 [IU]/h | INTRAVENOUS | Status: DC
  Administered 2019-12-13: 2200 [IU]/h via INTRAVENOUS
  Administered 2019-12-14: 2550 [IU]/h via INTRAVENOUS
  Filled 2019-12-13 (×4): qty 250

## 2019-12-13 MED ORDER — MAGNESIUM OXIDE 400 (241.3 MG) MG PO TABS
400.0000 mg | ORAL_TABLET | Freq: Every day | ORAL | Status: DC
  Administered 2019-12-13 – 2019-12-17 (×5): 400 mg via ORAL
  Filled 2019-12-13 (×5): qty 1

## 2019-12-13 NOTE — Telephone Encounter (Signed)
Pts mother left VM to notify our team that patient is in [facility] Emergency room for bilateral pulmonary embolism.     Routed to  Dr.McLean

## 2019-12-13 NOTE — Progress Notes (Signed)
  Echocardiogram 2D Echocardiogram has been performed.  Delcie Roch 12/13/2019, 5:30 PM

## 2019-12-13 NOTE — Progress Notes (Addendum)
PROGRESS NOTE  Sean Lawson AXE:[id number] DOB: 10/09/1985 DOA: 12/12/2019 PCP: [name], MD  HPI/Recap of past 24 hours: HPI from Dr [name] is a 34 y.o. male with recent admission for acute systolic heart failure EF measured was 20 to 25% with new diagnosis of diabetes mellitus at that time with history of EtOH abuse which patient has stopped since last admission, morbid obesity has followed up with his primary care physician because of elevated blood sugar yesterday.  During which patient was noticed to have none productive cough and primary care physician did a chest x-ray was showing concerning for pneumonia and referred patient to the ER.  Patient denies any fever chills denies any sputum production or any chest pain and states he has lost around 60 pounds weight. In the ED, patient was mildly hypoxic and D-dimer done showed elevated at around 6.61, EKG shows sinus tachycardia patient was having intermittent hypoxia blood pressure was stable CT angiogram of the chest done shows bilateral pulmonary embolism with mild right heart strain.  Patient was started on heparin.  Labs are remarkable for sodium of 128 blood sugar of 292 high sensitive troponin of 22 and Covid test was negative.  Patient admitted for further management.    Today, patient denies any new complaints, denies any chest pain, worsening shortness of breath, abdominal pain, nausea/vomiting, fever/chills.  Mother at bedside   Assessment/Plan: Principal Problem:   Acute pulmonary embolism (HCC) Active Problems:   Morbid obesity (HCC)   Hypertension   Chronic systolic CHF (congestive heart failure) (HCC)   Acute hypoxic respiratory failure likely 2/2 acute pulmonary embolism Currently requiring about 2 L of O2 Risk factor likely due to morbid obesity Troponin with a flat trend CTA chest showed extensive acute bilateral PE, with CT evidence of borderline right sided heart strain BLE Doppler  negative for DVT ECHO was done on 11/28/2019, which showed EF of 20 to 25%, very technically difficult study with poor images, will repeat limited ECHO Continue IV heparin, plan to switch to NOAC in the next 48 hours Supplemental oxygen as needed Telemetry  Mild AKI Creatinine 1.39 Will hold Entresto, spironolactone Daily BMP  Acute systolic HF Does not appear volume overloaded Echo as above Continue Coreg, digoxin, Lasix, hold Entresto and spironolactone for now Strict I's and O's, daily weights  HTN BP stable Continue meds as above  Diabetes mellitus type 2 Uncontrolled, A1c 11.4 SSI, Accu-Cheks, hypoglycemic protocol  Hyponatremia Daily BMP  Morbid obesity Lifestyle modification advised  History of alcohol abuse Quit, has not had alcohol for the past 3 weeks Encouraged to continue to abstain        Malnutrition Type:      Malnutrition Characteristics:      Nutrition Interventions:       Estimated body mass index is 54.5 kg/m as calculated from the following:   Height as of this encounter: 6\' 3"  (1.905 m).   Weight as of this encounter: 197.8 kg.     Code Status: Full  Family Communication: Discussed with mother at bedside  Disposition Plan: Status is: Inpatient  Remains inpatient appropriate because:Inpatient level of care appropriate due to severity of illness   Dispo: The patient is from: Home              Anticipated d/c is to: Home              Anticipated d/c date is: 2 days  Patient currently is not medically stable to d/c.   Consultants:  None  Procedures:  None  Antimicrobials:  None  DVT prophylaxis: Heparin drip   Objective: Vitals:   12/13/19 0530 12/13/19 0600 12/13/19 0830 12/13/19 0900  BP: 105/73 111/75 (!) 131/70 127/68  Pulse: 89 90 89 88  Resp: 20 17 (!) 24 22  Temp:      TempSrc:      SpO2: 95% 99% 96% 96%  Weight:      Height:        Intake/Output Summary (Last 24 hours) at  12/13/2019 1451 Last data filed at 12/13/2019 1438 Gross per 24 hour  Intake --  Output 1050 ml  Net -1050 ml   Filed Weights   12/12/19 1641  Weight: (!) 197.8 kg    Exam:  General: NAD, morbidly obese  Cardiovascular: S1, S2 present  Respiratory:  Diminished breath sounds bilaterally  Abdomen: Soft, nontender, obese, bowel sounds present  Musculoskeletal: 2+ bilateral pedal edema noted  Skin:  Rashes/possible insect bites on bilateral lower extremity which are chronic as per patient  Psychiatry: Normal mood   Data Reviewed: CBC: Recent Labs  Lab 12/12/19 1700 12/13/19 0357  WBC 10.0 10.0  NEUTROABS 7.3  --   HGB 15.3 14.2  HCT 48.1 45.3  MCV 86.7 86.0  PLT 420* 332   Basic Metabolic Panel: Recent Labs  Lab 12/12/19 1700 12/13/19 0357  NA 128* 131*  K 4.9 4.7  CL 88* 92*  CO2 29 28  GLUCOSE 292* 225*  BUN 26* 33*  CREATININE 1.13 1.39*  CALCIUM 9.4 9.3   GFR: Estimated Creatinine Clearance: 136.2 mL/min (A) (by C-G formula based on SCr of 1.39 mg/dL (H)). Liver Function Tests: No results for input(s): AST, ALT, ALKPHOS, BILITOT, PROT, ALBUMIN in the last 168 hours. No results for input(s): LIPASE, AMYLASE in the last 168 hours. No results for input(s): AMMONIA in the last 168 hours. Coagulation Profile: Recent Labs  Lab 12/13/19 0154  INR 1.2   Cardiac Enzymes: No results for input(s): CKTOTAL, CKMB, CKMBINDEX, TROPONINI in the last 168 hours. BNP (last 3 results) No results for input(s): PROBNP in the last 8760 hours. HbA1C: No results for input(s): HGBA1C in the last 72 hours. CBG: Recent Labs  Lab 12/13/19 0745 12/13/19 1210  GLUCAP 210* 256*   Lipid Profile: No results for input(s): CHOL, HDL, LDLCALC, TRIG, CHOLHDL, LDLDIRECT in the last 72 hours. Thyroid Function Tests: No results for input(s): TSH, T4TOTAL, FREET4, T3FREE, THYROIDAB in the last 72 hours. Anemia Panel: No results for input(s): VITAMINB12, FOLATE, FERRITIN,  TIBC, IRON, RETICCTPCT in the last 72 hours. Urine analysis:    Component Value Date/Time   COLORURINE AMBER (A) 11/27/2019 1545   APPEARANCEUR CLEAR 11/27/2019 1545   LABSPEC >1.030 (H) 11/27/2019 1545   PHURINE 6.0 11/27/2019 1545   GLUCOSEU 250 (A) 11/27/2019 1545   HGBUR TRACE (A) 11/27/2019 1545   BILIRUBINUR SMALL (A) 11/27/2019 1545   KETONESUR NEGATIVE 11/27/2019 1545   PROTEINUR 100 (A) 11/27/2019 1545   NITRITE NEGATIVE 11/27/2019 1545   LEUKOCYTESUR NEGATIVE 11/27/2019 1545   Sepsis Labs: @LABRCNTIP (procalcitonin:4,lacticidven:4)  ) Recent Results (from the past 240 hour(s))  SARS Coronavirus 2 by RT PCR (hospital order, performed in [facility] hospital lab) Nasopharyngeal Nasopharyngeal Swab     Status: None   Collection Time: 12/13/19  1:45 AM   Specimen: Nasopharyngeal Swab  Result Value Ref Range Status   SARS Coronavirus 2 NEGATIVE NEGATIVE Final  Comment: (NOTE) SARS-CoV-2 target nucleic acids are NOT DETECTED.  The SARS-CoV-2 RNA is generally detectable in upper and lower respiratory specimens during the acute phase of infection. The lowest concentration of SARS-CoV-2 viral copies this assay can detect is 250 copies / mL. A negative result does not preclude SARS-CoV-2 infection and should not be used as the sole basis for treatment or other patient management decisions.  A negative result may occur with improper specimen collection / handling, submission of specimen other than nasopharyngeal swab, presence of viral mutation(s) within the areas targeted by this assay, and inadequate number of viral copies (<250 copies / mL). A negative result must be combined with clinical observations, patient history, and epidemiological information.  Fact Sheet for Patients:   BoilerBrush.com.cy  Fact Sheet for Healthcare Providers: https://pope.com/  This test is not yet approved or  cleared by the [place] FDA  and has been authorized for detection and/or diagnosis of SARS-CoV-2 by FDA under an Emergency Use Authorization (EUA).  This EUA will remain in effect (meaning this test can be used) for the duration of the COVID-19 declaration under Section 564(b)(1) of the Act, 21 U.S.C. section 360bbb-3(b)(1), unless the authorization is terminated or revoked sooner.  Performed at [facility], [address] [place], [place] [address]       Studies: DG Chest 2 View  Result Date: 12/12/2019 CLINICAL DATA:  Cough, pneumonia EXAM: CHEST - 2 VIEW COMPARISON:  12/12/2019 at 11:45 a.m., 12/02/2019 FINDINGS: The lungs are well expanded and are symmetric. Streaky bibasilar pulmonary infiltrates are present, in keeping with atypical infection or aspiration in the appropriate clinical setting. No pneumothorax or pleural effusion. Cardiac size within normal limits. Pulmonary vascularity is normal. IMPRESSION: Streaky bibasilar pulmonary infiltrates, infection versus aspiration. Electronically Signed   By: Helyn Numbers MD   On: 12/12/2019 17:42   CT Angio Chest PE W and/or Wo Contrast  Result Date: 12/13/2019 CLINICAL DATA:  PE suspected.  Cough. EXAM: CT ANGIOGRAPHY CHEST WITH CONTRAST TECHNIQUE: Multidetector CT imaging of the chest was performed using the standard protocol during bolus administration of intravenous contrast. Multiplanar CT image reconstructions and MIPs were obtained to evaluate the vascular anatomy. CONTRAST:  OMNIPAQUE IOHEXOL 350 MG/ML SOLN COMPARISON:  None. FINDINGS: Cardiovascular: Contrast injection is sufficient to demonstrate satisfactory opacification of the pulmonary arteries to the segmental level. There are acute bilateral pulmonary emboli. On the right, there is an acute, near occlusive thrombus involving the main right pulmonary artery extending into the lobar branches of the right lower lobe, right middle lobe, and right upper lobe. On the left, there are acute, near  occlusive pulmonary emboli involving the left lower lobe and left upper lobe in a lobar, segmental, and subsegmental distribution. The heart size is significantly enlarged. There is borderline right heart strain with the RV/LV ratio measuring approximately 0.9. There is no evidence for thoracic aortic dissection or aneurysm. No significant pericardial effusion. Mediastinum/Nodes: -- No mediastinal lymphadenopathy. -- No hilar lymphadenopathy. -- No axillary lymphadenopathy. -- No supraclavicular lymphadenopathy. -- Normal thyroid gland where visualized. -  Unremarkable esophagus. Lungs/Pleura: There is no pneumothorax. There are pulmonary opacities at the lung bases bilaterally favored to represent areas of pulmonary infarction. There is no significant pleural effusion. Upper Abdomen: Contrast bolus timing is not optimized for evaluation of the abdominal organs. There appears to be underlying hepatic steatosis. Musculoskeletal: No chest wall abnormality. No bony spinal canal stenosis. Review of the MIP images confirms the above findings. IMPRESSION:  1. Extensive acute bilateral pulmonary emboli as detailed above with CT evidence for borderline right-sided heart strain with the RV/LV ratio measuring approximately 0.9. 2. Cardiomegaly. 3. Bibasilar airspace opacities favored to represent pulmonary infarcts in the setting of acute pulmonary emboli. Multifocal pneumonia is felt to be less likely. 4. Questionable hepatic steatosis. These results were called by telephone at the time of interpretation on 12/13/2019 at 1:29 am to provider [facility] , who verbally acknowledged these results. Electronically Signed   By: Katherine Mantle M.D.   On: 12/13/2019 01:34   VAS [place] LOWER EXTREMITY VENOUS (DVT)  Result Date: 12/13/2019  Lower Venous DVTStudy Indications: Edema.  Limitations: Body habitus and poor ultrasound/tissue interface. Comparison Study: no prior Performing Technologist: Blanch Media RVS  Examination  Guidelines: A complete evaluation includes B-mode imaging, spectral Doppler, color Doppler, and power Doppler as needed of all accessible portions of each vessel. Bilateral testing is considered an integral part of a complete examination. Limited examinations for reoccurring indications may be performed as noted. The reflux portion of the exam is performed with the patient in reverse Trendelenburg.  +---------+---------------+---------+-----------+----------+--------------+ RIGHT    CompressibilityPhasicitySpontaneityPropertiesThrombus Aging +---------+---------------+---------+-----------+----------+--------------+ CFV      Full           Yes      Yes                                 +---------+---------------+---------+-----------+----------+--------------+ SFJ      Full                                                        +---------+---------------+---------+-----------+----------+--------------+ FV Prox  Full                                                        +---------+---------------+---------+-----------+----------+--------------+ FV Mid   Full                                                        +---------+---------------+---------+-----------+----------+--------------+ FV DistalFull                                                        +---------+---------------+---------+-----------+----------+--------------+ PFV      Full                                                        +---------+---------------+---------+-----------+----------+--------------+ POP      Full           Yes      Yes                                 +---------+---------------+---------+-----------+----------+--------------+  PTV      Full                                                        +---------+---------------+---------+-----------+----------+--------------+ PERO                                                  Not visualized  +---------+---------------+---------+-----------+----------+--------------+   +---------+---------------+---------+-----------+----------+--------------+ LEFT     CompressibilityPhasicitySpontaneityPropertiesThrombus Aging +---------+---------------+---------+-----------+----------+--------------+ CFV      Full           Yes      Yes                                 +---------+---------------+---------+-----------+----------+--------------+ SFJ      Full                                                        +---------+---------------+---------+-----------+----------+--------------+ FV Prox  Full                                                        +---------+---------------+---------+-----------+----------+--------------+ FV Mid   Full                                                        +---------+---------------+---------+-----------+----------+--------------+ FV DistalFull                                                        +---------+---------------+---------+-----------+----------+--------------+ PFV      Full                                                        +---------+---------------+---------+-----------+----------+--------------+ POP      Full           Yes      Yes                                 +---------+---------------+---------+-----------+----------+--------------+ PTV      Full                                                        +---------+---------------+---------+-----------+----------+--------------+  PERO                                                  Not visualized +---------+---------------+---------+-----------+----------+--------------+     Summary: BILATERAL: - No evidence of deep vein thrombosis seen in the lower extremities, bilaterally. - No evidence of superficial venous thrombosis in the lower extremities, bilaterally. -   *See table(s) above for measurements and observations. Electronically signed by  Sherald Hess MD on 12/13/2019 at 12:48:28 PM.    Final     Scheduled Meds: . carvedilol  3.125 mg Oral BID WC  . digoxin  0.125 mg Oral Daily  . furosemide  80 mg Oral Daily  . insulin aspart  0-9 Units Subcutaneous TID WC  . magnesium oxide  400 mg Oral Daily    Continuous Infusions: . heparin 2,550 Units/hr (12/13/19 1030)     LOS: 0 days     Briant Cedar, MD [facility]  If 7PM-7AM, please contact night-coverage [email or web address] 12/13/2019, 2:51 PM

## 2019-12-13 NOTE — Progress Notes (Signed)
ANTICOAGULATION CONSULT NOTE - Initial Consult  Pharmacy Consult for heparin Indication: pulmonary embolus  No Known Allergies  Patient Measurements: Height: 6\' 3"  (190.5 cm) Weight: (!) 197.8 kg (436 lb) IBW/kg (Calculated) : 84.5 Heparin Dosing Weight: 135kg  Vital Signs: Temp: 97.8 F (36.6 C) (07/22 2135) Temp Source: Oral (07/22 2135) BP: 120/88 (07/23 0000) Pulse Rate: 91 (07/23 0000)  Labs: Recent Labs    12/12/19 1700  HGB 15.3  HCT 48.1  PLT 420*  CREATININE 1.13    Estimated Creatinine Clearance: 167.5 mL/min (by C-G formula based on SCr of 1.13 mg/dL).   Medical History: Past Medical History:  Diagnosis Date  . Diabetes mellitus without complication (HCC)   . Hypertension     Assessment: 34yo male c/o cough x several weeks, was dx'd w/ PNA at outpt and sent to ED for further evaluation, CT reveals extensive bilateral PE w/ evidence of RHS, to begin heparin.  Goal of Therapy:  Heparin level 0.3-0.7 units/ml Monitor platelets by anticoagulation protocol: Yes   Plan:  Will give heparin 6000 units IV bolus x1 followed by gtt at 2200 units/hr and monitor heparin levels and CBC.  34yo, PharmD, BCPS  12/13/2019,1:57 AM

## 2019-12-13 NOTE — ED Notes (Signed)
Pt ambulated with a pulse ox. Pt oxygen saturations did drop to 85% on room air however he did have a poor pleth. Pt hooked back up to the monitor in the room and oxygen saturation improved to 95% with a sufficient pleth.

## 2019-12-13 NOTE — Progress Notes (Signed)
ANTICOAGULATION CONSULT NOTE  Pharmacy Consult for heparin Indication: pulmonary embolus  No Known Allergies  Patient Measurements: Height: 6\' 3"  (190.5 cm) Weight: (!) 197.8 kg (436 lb) IBW/kg (Calculated) : 84.5 Heparin Dosing Weight: 133.3 kg  Vital Signs: BP: 111/75 (07/23 0600) Pulse Rate: 90 (07/23 0600)  Labs: Recent Labs    12/12/19 1700 12/13/19 0154 12/13/19 0357 12/13/19 0638 12/13/19 0901  HGB 15.3  --  14.2  --   --   HCT 48.1  --  45.3  --   --   PLT 420*  --  332  --   --   APTT  --  30  --   --   --   LABPROT  --  14.8  --   --   --   INR  --  1.2  --   --   --   HEPARINUNFRC  --  <0.10*  --   --  0.19*  CREATININE 1.13  --  1.39*  --   --   TROPONINIHS  --  22* 22* 23*  --     Estimated Creatinine Clearance: 136.2 mL/min (A) (by C-G formula based on SCr of 1.39 mg/dL (H)).   Medical History: Past Medical History:  Diagnosis Date  . Chronic systolic CHF (congestive heart failure) (HCC) 12/13/2019  . Diabetes mellitus without complication (HCC)   . Hypertension     Assessment: 34yo male c/o cough x several weeks, was dx'd w/ PNA at outpt and sent to ED for further evaluation, CT reveals extensive bilateral PE w/ evidence of RHS, to begin heparin. Patient is not on anticoagulation PTA.  Initial heparin level low at 0.19. CBC wnl. No bleeding or issues with infusion per discussion with RN.  Goal of Therapy:  Heparin level 0.3-0.7 units/ml Monitor platelets by anticoagulation protocol: Yes   Plan:  Heparin 2000 unit bolus x 1 Increase heparin to 2550 units/hr Check 6hr heparin level Monitor daily heparin level and CBC, s/sx bleeding F/u plan for long-term anticoagulation as appropriate   34yo, PharmD, BCPS Please check AMION for all [facility] contact numbers Clinical Pharmacist 12/13/2019 10:02 AM

## 2019-12-13 NOTE — H&P (Signed)
History and Physical    [name]:[id number] DOB: 1985-09-13 DOA: 12/12/2019  PCP: [name], MD  Patient coming from: Home.  Chief Complaint: Cough.  HPI: Sean Lawson is a 34 y.o. male with recent admission for acute systolic heart failure EF measured was 20 to 25% with new diagnosis of diabetes mellitus at that time with history of EtOH abuse which patient has stopped since last admission, morbid obesity has followed up with his primary care physician because of elevated blood sugar yesterday.  During which patient was noticed to have none productive cough and primary care physician did a chest x-ray was showing concerning for pneumonia and referred patient to the ER.  Patient denies any fever chills denies any sputum production or any chest pain and states he has lost around 60 pounds weight.  ED Course: The ER patient was mildly hypoxic and D-dimer done showed elevated at around 6.61 EKG shows sinus tachycardia patient was having intermittent hypoxia blood pressure was stable CT angiogram of the chest done shows bilateral pulmonary embolism with mild right heart strain.  Patient was started on heparin.  Labs are remarkable for sodium of 128 blood sugar of 292 high sensitive troponin of 22 and Covid test was negative.  Review of Systems: As per HPI, rest all negative.   Past Medical History:  Diagnosis Date  . Chronic systolic CHF (congestive heart failure) (HCC) 12/13/2019  . Diabetes mellitus without complication (HCC)   . Hypertension     Past Surgical History:  Procedure Laterality Date  . RIGHT/LEFT HEART CATH AND CORONARY ANGIOGRAPHY N/A 12/02/2019   Procedure: RIGHT/LEFT HEART CATH AND CORONARY ANGIOGRAPHY;  Surgeon: Laurey Morale, MD;  Location: [facility] INVASIVE CV LAB;  Service: Cardiovascular;  Laterality: N/A;     reports that he has never smoked. He quit smokeless tobacco use about 6 years ago.  His smokeless tobacco use included snuff. He reports current  alcohol use of about 30.0 standard drinks of alcohol per week. He reports that he does not use drugs.  No Known Allergies  Family History  Problem Relation Age of Onset  . Diabetes Mellitus II Mother   . Heart failure Maternal Grandmother   . Heart disease Maternal Grandfather     Prior to Admission medications   Medication Sig Start Date End Date Taking? Authorizing Provider  carvedilol (COREG) 3.125 MG tablet Take 1 tablet (3.125 mg total) by mouth 2 (two) times daily with a meal. 12/03/19  Yes Clegg, Amy D, NP  digoxin (LANOXIN) 0.125 MG tablet Take 1 tablet (0.125 mg total) by mouth daily. 12/03/19  Yes Clegg, Amy D, NP  furosemide (LASIX) 80 MG tablet Take 1 tablet (80 mg total) by mouth daily. 12/04/19 12/03/20 Yes Clegg, Amy D, NP  magnesium oxide (MAG-OX) 400 (241.3 Mg) MG tablet Take 1 tablet (400 mg total) by mouth daily. 12/03/19  Yes Clegg, Amy D, NP  metFORMIN (GLUCOPHAGE) 500 MG tablet Take 1 tablet (500 mg total) by mouth 2 (two) times daily with a meal. 12/04/19 12/03/20 Yes Clegg, Amy D, NP  sacubitril-valsartan (ENTRESTO) 49-51 MG Take 1 tablet by mouth 2 (two) times daily. 12/03/19  Yes Clegg, Amy D, NP  spironolactone (ALDACTONE) 25 MG tablet Take 1 tablet (25 mg total) by mouth daily. 12/03/19  Yes Clegg, Amy D, NP  acetaminophen (TYLENOL) 500 MG tablet Take 1,000 mg by mouth every 6 (six) hours as needed for mild pain. Patient not taking: Reported on 12/12/2019  [provider]    Physical Exam: Constitutional: Moderately built and nourished. Vitals:   12/13/19 0145 12/13/19 0200 12/13/19 0230 12/13/19 0330  BP: (!) 143/101 128/70 (!) 105/90 (!) 116/89  Pulse: 98 96 95 93  Resp: 18 13 21  (!) 27  Temp:      TempSrc:      SpO2: 91% (!) 87% 91% 94%  Weight:      Height:       Eyes: Anicteric no pallor. ENMT: No discharge from the ears eyes nose or mouth. Neck: No mass felt.  JVD elevated. Respiratory: No rhonchi or crepitations. Cardiovascular: S1-S2  heard. Abdomen: Soft nontender bowel sounds present. Musculoskeletal: Bilateral lower extremity edema present. Skin: Has some macules on the leg which are chronic as per the patient. Neurologic: Alert awake oriented to time place and person.  Moves all extremities. Psychiatric: Appears normal.   Labs on Admission: I have personally reviewed following labs and imaging studies  CBC: Recent Labs  Lab 12/12/19 1700  WBC 10.0  NEUTROABS 7.3  HGB 15.3  HCT 48.1  MCV 86.7  PLT 420*   Basic Metabolic Panel: Recent Labs  Lab 12/12/19 1700  NA 128*  K 4.9  CL 88*  CO2 29  GLUCOSE 292*  BUN 26*  CREATININE 1.13  CALCIUM 9.4   GFR: Estimated Creatinine Clearance: 167.5 mL/min (by C-G formula based on SCr of 1.13 mg/dL). Liver Function Tests: No results for input(s): AST, ALT, ALKPHOS, BILITOT, PROT, ALBUMIN in the last 168 hours. No results for input(s): LIPASE, AMYLASE in the last 168 hours. No results for input(s): AMMONIA in the last 168 hours. Coagulation Profile: Recent Labs  Lab 12/13/19 0154  INR 1.2   Cardiac Enzymes: No results for input(s): CKTOTAL, CKMB, CKMBINDEX, TROPONINI in the last 168 hours. BNP (last 3 results) No results for input(s): PROBNP in the last 8760 hours. HbA1C: No results for input(s): HGBA1C in the last 72 hours. CBG: No results for input(s): GLUCAP in the last 168 hours. Lipid Profile: No results for input(s): CHOL, HDL, LDLCALC, TRIG, CHOLHDL, LDLDIRECT in the last 72 hours. Thyroid Function Tests: No results for input(s): TSH, T4TOTAL, FREET4, T3FREE, THYROIDAB in the last 72 hours. Anemia Panel: No results for input(s): VITAMINB12, FOLATE, FERRITIN, TIBC, IRON, RETICCTPCT in the last 72 hours. Urine analysis:    Component Value Date/Time   COLORURINE AMBER (A) 11/27/2019 1545   APPEARANCEUR CLEAR 11/27/2019 1545   LABSPEC >1.030 (H) 11/27/2019 1545   PHURINE 6.0 11/27/2019 1545   GLUCOSEU 250 (A) 11/27/2019 1545   HGBUR TRACE  (A) 11/27/2019 1545   BILIRUBINUR SMALL (A) 11/27/2019 1545   KETONESUR NEGATIVE 11/27/2019 1545   PROTEINUR 100 (A) 11/27/2019 1545   NITRITE NEGATIVE 11/27/2019 1545   LEUKOCYTESUR NEGATIVE 11/27/2019 1545   Sepsis Labs: @LABRCNTIP (procalcitonin:4,lacticidven:4) ) Recent Results (from the past 240 hour(s))  SARS Coronavirus 2 by RT PCR (hospital order, performed in [facility] hospital lab) Nasopharyngeal Nasopharyngeal Swab     Status: None   Collection Time: 12/13/19  1:45 AM   Specimen: Nasopharyngeal Swab  Result Value Ref Range Status   SARS Coronavirus 2 NEGATIVE NEGATIVE Final    Comment: (NOTE) SARS-CoV-2 target nucleic acids are NOT DETECTED.  The SARS-CoV-2 RNA is generally detectable in upper and lower respiratory specimens during the acute phase of infection. The lowest concentration of SARS-CoV-2 viral copies this assay can detect is 250 copies / mL. A negative result does not preclude SARS-CoV-2 infection and should not  be used as the sole basis for treatment or other patient management decisions.  A negative result may occur with improper specimen collection / handling, submission of specimen other than nasopharyngeal swab, presence of viral mutation(s) within the areas targeted by this assay, and inadequate number of viral copies (<250 copies / mL). A negative result must be combined with clinical observations, patient history, and epidemiological information.  Fact Sheet for Patients:   BoilerBrush.com.cy  Fact Sheet for Healthcare Providers: https://pope.com/  This test is not yet approved or  cleared by the [place] FDA and has been authorized for detection and/or diagnosis of SARS-CoV-2 by FDA under an Emergency Use Authorization (EUA).  This EUA will remain in effect (meaning this test can be used) for the duration of the COVID-19 declaration under Section 564(b)(1) of the Act, 21 U.S.C. section  360bbb-3(b)(1), unless the authorization is terminated or revoked sooner.  Performed at [facility], [address], [place], [place] [address]      Radiological Exams on Admission: DG Chest 2 View  Result Date: 12/12/2019 CLINICAL DATA:  Cough, pneumonia EXAM: CHEST - 2 VIEW COMPARISON:  12/12/2019 at 11:45 a.m., 12/02/2019 FINDINGS: The lungs are well expanded and are symmetric. Streaky bibasilar pulmonary infiltrates are present, in keeping with atypical infection or aspiration in the appropriate clinical setting. No pneumothorax or pleural effusion. Cardiac size within normal limits. Pulmonary vascularity is normal. IMPRESSION: Streaky bibasilar pulmonary infiltrates, infection versus aspiration. Electronically Signed   By: Helyn Numbers MD   On: 12/12/2019 17:42   CT Angio Chest PE W and/or Wo Contrast  Result Date: 12/13/2019 CLINICAL DATA:  PE suspected.  Cough. EXAM: CT ANGIOGRAPHY CHEST WITH CONTRAST TECHNIQUE: Multidetector CT imaging of the chest was performed using the standard protocol during bolus administration of intravenous contrast. Multiplanar CT image reconstructions and MIPs were obtained to evaluate the vascular anatomy. CONTRAST:  OMNIPAQUE IOHEXOL 350 MG/ML SOLN COMPARISON:  None. FINDINGS: Cardiovascular: Contrast injection is sufficient to demonstrate satisfactory opacification of the pulmonary arteries to the segmental level. There are acute bilateral pulmonary emboli. On the right, there is an acute, near occlusive thrombus involving the main right pulmonary artery extending into the lobar branches of the right lower lobe, right middle lobe, and right upper lobe. On the left, there are acute, near occlusive pulmonary emboli involving the left lower lobe and left upper lobe in a lobar, segmental, and subsegmental distribution. The heart size is significantly enlarged. There is borderline right heart strain with the RV/LV ratio measuring approximately 0.9. There  is no evidence for thoracic aortic dissection or aneurysm. No significant pericardial effusion. Mediastinum/Nodes: -- No mediastinal lymphadenopathy. -- No hilar lymphadenopathy. -- No axillary lymphadenopathy. -- No supraclavicular lymphadenopathy. -- Normal thyroid gland where visualized. -  Unremarkable esophagus. Lungs/Pleura: There is no pneumothorax. There are pulmonary opacities at the lung bases bilaterally favored to represent areas of pulmonary infarction. There is no significant pleural effusion. Upper Abdomen: Contrast bolus timing is not optimized for evaluation of the abdominal organs. There appears to be underlying hepatic steatosis. Musculoskeletal: No chest wall abnormality. No bony spinal canal stenosis. Review of the MIP images confirms the above findings. IMPRESSION: 1. Extensive acute bilateral pulmonary emboli as detailed above with CT evidence for borderline right-sided heart strain with the RV/LV ratio measuring approximately 0.9. 2. Cardiomegaly. 3. Bibasilar airspace opacities favored to represent pulmonary infarcts in the setting of acute pulmonary emboli. Multifocal pneumonia is felt to be less likely. 4. Questionable hepatic  steatosis. These results were called by telephone at the time of interpretation on 12/13/2019 at 1:29 am to provider [facility] , who verbally acknowledged these results. Electronically Signed   By: Katherine Mantle M.D.   On: 12/13/2019 01:34    EKG: Independently reviewed.  Sinus tachycardia.  Assessment/Plan Principal Problem:   Acute pulmonary embolism (HCC) Active Problems:   Morbid obesity (HCC)   Hypertension   Chronic systolic CHF (congestive heart failure) (HCC)    1. Acute pulmonary embolism with mild right heart strain presently hemodynamically stable.  Pulmonary embolism likely unprovoked.  Has not had any recent travel or no family history.  Patient started on heparin.  Will slowly transition to oral anticoagulants once patient is  stable.  Check Dopplers of the lower experiences patient has mild right heart strain will check troponins and also repeat 2D echo. 2. Recently diagnosed systolic heart failure with a EF of 20 to 25%.  On Lasix Entresto digoxin spironolactone and Coreg.  Will notify cardiology. 3. Diabetes mellitus type 2 uncontrolled with recent hemoglobin A1c of 11.4.  On sliding scale coverage.  May need long-acting insulin. 4. History of alcohol abuse has not had any alcohol for last 3 weeks. 5. Morbid obesity will need nutrition counseling. 6. Hyponatremia could be from hyperglycemia also could be from fluid overload.  On that patient is on spironolactone.  So any further worsening of sodium may have to hold spironolactone.  Since patient has bilateral pulmonary embolism with mild right heart strain will need close monitoring for any further worsening in inpatient status.   DVT prophylaxis: Heparin. Code Status: Full code. Family Communication: Patient's mother. Disposition Plan: Home. Consults called: Cardiology. Admission status: Inpatient.   Eduard Clos MD [facility] Pager [phone].  If 7PM-7AM, please contact night-coverage [email or web address] Password [facility]  12/13/2019, 4:01 AM

## 2019-12-13 NOTE — Progress Notes (Signed)
ANTICOAGULATION CONSULT NOTE  Pharmacy Consult for heparin Indication: pulmonary embolus  No Known Allergies  Patient Measurements: Height: 6\' 3"  (190.5 cm) Weight: (!) 197.8 kg (436 lb) IBW/kg (Calculated) : 84.5 Heparin Dosing Weight: 135kg  Vital Signs: BP: 127/68 (07/23 0900) Pulse Rate: 82 (07/23 1546)  Labs: Recent Labs    12/12/19 1700 12/13/19 0154 12/13/19 0357 12/13/19 0638 12/13/19 0901 12/13/19 1748  HGB 15.3  --  14.2  --   --   --   HCT 48.1  --  45.3  --   --   --   PLT 420*  --  332  --   --   --   APTT  --  30  --   --   --   --   LABPROT  --  14.8  --   --   --   --   INR  --  1.2  --   --   --   --   HEPARINUNFRC  --  <0.10*  --   --  0.19* 0.38  CREATININE 1.13  --  1.39*  --   --   --   TROPONINIHS  --  22* 22* 23*  --   --     Estimated Creatinine Clearance: 136.2 mL/min (A) (by C-G formula based on SCr of 1.39 mg/dL (H)).   Medical History: Past Medical History:  Diagnosis Date  . Chronic systolic CHF (congestive heart failure) (HCC) 12/13/2019  . Diabetes mellitus without complication (HCC)   . Hypertension     Assessment: 34yo male c/o cough x several weeks, was dx'd w/ PNA at outpt and sent to ED for further evaluation, CT reveals extensive bilateral PE w/ evidence of RHS, to begin heparin.  Heparin level therapeutic s/p rate increase to 2550 units/hr  Goal of Therapy:  Heparin level 0.3-0.7 units/ml Monitor platelets by anticoagulation protocol: Yes   Plan:  Continue heparin gtt at 2550 units/hr F/u heparin level with AM labs Daily heparin level, CBC, s/s bleeding  34yo, PharmD Clinical Pharmacist ED Pharmacist Phone # [phone] 12/13/2019 7:24 PM

## 2019-12-13 NOTE — ED Notes (Signed)
SDU Breakfast Ordered 

## 2019-12-13 NOTE — ED Notes (Signed)
Patient transported to CT 

## 2019-12-13 NOTE — Progress Notes (Signed)
Lower extremity venous has been completed.   Preliminary results in CV Proc.   Blanch Media 12/13/2019 9:51 AM

## 2019-12-14 ENCOUNTER — Inpatient Hospital Stay (HOSPITAL_COMMUNITY): Payer: 59

## 2019-12-14 DIAGNOSIS — I5022 Chronic systolic (congestive) heart failure: Secondary | ICD-10-CM

## 2019-12-14 DIAGNOSIS — I10 Essential (primary) hypertension: Secondary | ICD-10-CM

## 2019-12-14 LAB — GLUCOSE, CAPILLARY
Glucose-Capillary: 207 mg/dL — ABNORMAL HIGH (ref 70–99)
Glucose-Capillary: 321 mg/dL — ABNORMAL HIGH (ref 70–99)
Glucose-Capillary: 262 mg/dL — ABNORMAL HIGH (ref 70–99)
Glucose-Capillary: 231 mg/dL — ABNORMAL HIGH (ref 70–99)

## 2019-12-14 LAB — BASIC METABOLIC PANEL
Anion gap: 13 (ref 5–15)
BUN: 27 mg/dL — ABNORMAL HIGH (ref 6–20)
CO2: 25 mmol/L (ref 22–32)
Calcium: 9.3 mg/dL (ref 8.9–10.3)
Chloride: 95 mmol/L — ABNORMAL LOW (ref 98–111)
Creatinine, Ser: 0.93 mg/dL (ref 0.61–1.24)
GFR calc Af Amer: >60 mL/min (ref >60)
GFR calc non Af Amer: >60 mL/min (ref >60)
Glucose, Bld: 193 mg/dL — ABNORMAL HIGH (ref 70–99)
Potassium: 4.4 mmol/L (ref 3.5–5.1)
Sodium: 133 mmol/L — ABNORMAL LOW (ref 135–145)

## 2019-12-14 LAB — CBC
HCT: 44.2 % (ref 39.0–52.0)
Hemoglobin: 13.9 g/dL (ref 13.0–17.0)
MCH: 27.1 pg (ref 26.0–34.0)
MCHC: 31.4 g/dL (ref 30.0–36.0)
MCV: 86.2 fL (ref 80.0–100.0)
Platelets: 297 10*3/uL (ref 150–400)
RBC: 5.13 MIL/uL (ref 4.22–5.81)
RDW: 13.9 % (ref 11.5–15.5)
WBC: 7.8 10*3/uL (ref 4.0–10.5)
nRBC: 0 % (ref 0.0–0.2)

## 2019-12-14 LAB — HEPARIN LEVEL (UNFRACTIONATED): Heparin Unfractionated: 0.37 [IU]/mL (ref 0.30–0.70)

## 2019-12-14 MED ORDER — WARFARIN - PHARMACIST DOSING INPATIENT: Freq: Every day | Status: DC

## 2019-12-14 MED ORDER — GUAIFENESIN ER 600 MG PO TB12
600.0000 mg | ORAL_TABLET | Freq: Two times a day (BID) | ORAL | Status: DC
  Administered 2019-12-14 – 2019-12-17 (×7): 600 mg via ORAL
  Filled 2019-12-14 (×7): qty 1

## 2019-12-14 MED ORDER — SACUBITRIL-VALSARTAN 49-51 MG PO TABS
1.0000 | ORAL_TABLET | Freq: Two times a day (BID) | ORAL | Status: DC
  Administered 2019-12-14 – 2019-12-17 (×7): 1 via ORAL
  Filled 2019-12-14 (×8): qty 1

## 2019-12-14 MED ORDER — APIXABAN 5 MG PO TABS: 5.0000 mg | ORAL_TABLET | Freq: Two times a day (BID) | ORAL | Status: DC

## 2019-12-14 MED ORDER — PANTOPRAZOLE SODIUM 40 MG PO TBEC
40.0000 mg | DELAYED_RELEASE_TABLET | Freq: Every day | ORAL | Status: DC
  Administered 2019-12-14 – 2019-12-17 (×4): 40 mg via ORAL
  Filled 2019-12-14 (×4): qty 1

## 2019-12-14 MED ORDER — HEPARIN (PORCINE) 25000 UT/250ML-% IV SOLN
2650.0000 [IU]/h | INTRAVENOUS | Status: DC
  Administered 2019-12-14 – 2019-12-17 (×7): 2650 [IU]/h via INTRAVENOUS
  Filled 2019-12-14 (×7): qty 250

## 2019-12-14 MED ORDER — WARFARIN SODIUM 7.5 MG PO TABS: 15.0000 mg | ORAL_TABLET | Freq: Once | ORAL | Status: DC

## 2019-12-14 MED ORDER — WARFARIN SODIUM 7.5 MG PO TABS
15.0000 mg | ORAL_TABLET | Freq: Once | ORAL | Status: AC
  Administered 2019-12-14: 15 mg via ORAL
  Filled 2019-12-14: qty 2

## 2019-12-14 MED ORDER — APIXABAN 5 MG PO TABS: 10.0000 mg | ORAL_TABLET | Freq: Two times a day (BID) | ORAL | Status: DC

## 2019-12-14 MED ORDER — INSULIN GLARGINE 100 UNIT/ML ~~LOC~~ SOLN
10.0000 [IU] | Freq: Every day | SUBCUTANEOUS | Status: DC
  Administered 2019-12-14 – 2019-12-16 (×3): 10 [IU] via SUBCUTANEOUS
  Filled 2019-12-14 (×3): qty 0.1

## 2019-12-14 MED ORDER — SPIRONOLACTONE 25 MG PO TABS
25.0000 mg | ORAL_TABLET | Freq: Every day | ORAL | Status: DC
  Administered 2019-12-14 – 2019-12-17 (×4): 25 mg via ORAL
  Filled 2019-12-14 (×4): qty 1

## 2019-12-14 NOTE — Discharge Instructions (Addendum)
Information on my medicine - Coumadin   (Warfarin)  This medication education was reviewed with me or my healthcare representative as part of my discharge preparation.   Why was Coumadin prescribed for you? Coumadin was prescribed for you because you have a blood clot or a medical condition that can cause an increased risk of forming blood clots. Blood clots can cause serious health problems by blocking the flow of blood to the heart, lung, or brain. Coumadin can prevent harmful blood clots from forming. As a reminder your indication for Coumadin is:   Pulmonary Embolism Treatment  What test will check on my response to Coumadin? While on Coumadin (warfarin) you will need to have an INR test regularly to ensure that your dose is keeping you in the desired range. The INR (international normalized ratio) number is calculated from the result of the laboratory test called prothrombin time (PT).  If an INR APPOINTMENT HAS NOT ALREADY BEEN MADE FOR YOU please schedule an appointment to have this lab work done by your health care provider within 7 days. Your INR goal is usually a number between:  2 to 3 or your provider may give you a more narrow range like 2-2.5.  Ask your health care provider during an office visit what your goal INR is.  What  do you need to  know  About  COUMADIN? Take Coumadin (warfarin) exactly as prescribed by your healthcare provider about the same time each day.  DO NOT stop taking without talking to the doctor who prescribed the medication.  Stopping without other blood clot prevention medication to take the place of Coumadin may increase your risk of developing a new clot or stroke.  Get refills before you run out.  What do you do if you miss a dose? If you miss a dose, take it as soon as you remember on the same day then continue your regularly scheduled regimen the next day.  Do not take two doses of Coumadin at the same time.  Important Safety Information A possible side  effect of Coumadin (Warfarin) is an increased risk of bleeding. You should call your healthcare provider right away if you experience any of the following: ? Bleeding from an injury or your nose that does not stop. ? Unusual colored urine (red or dark brown) or unusual colored stools (red or black). ? Unusual bruising for unknown reasons. ? A serious fall or if you hit your head (even if there is no bleeding).  Some foods or medicines interact with Coumadin (warfarin) and might alter your response to warfarin. To help avoid this: ? Eat a balanced diet, maintaining a consistent amount of Vitamin K. ? Notify your provider about major diet changes you plan to make. ? Avoid alcohol or limit your intake to 1 drink for women and 2 drinks for men per day. (1 drink is 5 oz. wine, 12 oz. beer, or 1.5 oz. liquor.)  Make sure that ANY health care provider who prescribes medication for you knows that you are taking Coumadin (warfarin).  Also make sure the healthcare provider who is monitoring your Coumadin knows when you have started a new medication including herbals and non-prescription products.  Coumadin (Warfarin)  Major Drug Interactions  Increased Warfarin Effect Decreased Warfarin Effect  Alcohol (large quantities) Antibiotics (esp. Septra/Bactrim, Flagyl, Cipro) Amiodarone (Cordarone) Aspirin (ASA) Cimetidine (Tagamet) Megestrol (Megace) NSAIDs (ibuprofen, naproxen, etc.) Piroxicam (Feldene) Propafenone (Rythmol SR) Propranolol (Inderal) Isoniazid (INH) Posaconazole (Noxafil) Barbiturates (Phenobarbital) Carbamazepine (Tegretol) Chlordiazepoxide (Librium)  Cholestyramine (Questran) Griseofulvin Oral Contraceptives Rifampin Sucralfate (Carafate) Vitamin K   Coumadin (Warfarin) Major Herbal Interactions  Increased Warfarin Effect Decreased Warfarin Effect  Garlic Ginseng Ginkgo biloba Coenzyme Q10 Green tea St. John's wort    Coumadin (Warfarin) FOOD Interactions  Eat a  consistent number of servings per week of foods HIGH in Vitamin K (1 serving =  cup)  Collards (cooked, or boiled & drained) Kale (cooked, or boiled & drained) Mustard greens (cooked, or boiled & drained) Parsley *serving size only =  cup Spinach (cooked, or boiled & drained) Swiss chard (cooked, or boiled & drained) Turnip greens (cooked, or boiled & drained)  Eat a consistent number of servings per week of foods MEDIUM-HIGH in Vitamin K (1 serving = 1 cup)  Asparagus (cooked, or boiled & drained) Broccoli (cooked, boiled & drained, or raw & chopped) Brussel sprouts (cooked, or boiled & drained) *serving size only =  cup Lettuce, raw (green leaf, endive, romaine) Spinach, raw Turnip greens, raw & chopped   These websites have more information on Coumadin (warfarin):  http://www.king-russell.com/; https://www.hines.net/;   ================================================================== Vitamin K Foods and Warfarin Warfarin is a blood thinner (anticoagulant). Anticoagulant medicines help prevent the formation of blood clots. These medicines work by decreasing the activity of vitamin K, which promotes normal blood clotting. When you take warfarin, problems can occur from suddenly increasing or decreasing the amount of vitamin K that you eat from one day to the next. Problems may include:  Blood clots.  Bleeding.  What general guidelines do I need to follow? To avoid problems when taking warfarin: 1. Eat a balanced diet that includes: ? Fresh fruits and vegetables. ? Whole grains. ? Low-fat dairy products. ? Lean proteins, such as fish, eggs, and lean cuts of meat. 2. Keep your intake of vitamin K consistent from day to day. To do this: ? Avoid eating large amounts of vitamin K one day and low amounts of vitamin K the next day. ? If you take a multivitamin that contains vitamin K, be sure to take it every day. ? Know which foods contain vitamin K. Use the lists below to  understand serving sizes and the amount of vitamin K in one serving. 3. Avoid major changes in your diet. If you are going to change your diet, talk with your health care provider before making changes. 4. Work with a Dealer (dietitian) to develop a meal plan that works best for you.  High vitamin K foods Foods that are high in vitamin K contain more than 100 mcg (micrograms) per serving. These include:  Broccoli (cooked) -  cup has 110 mcg.  Brussels sprouts (cooked) -  cup has 109 mcg.  Greens, beet (cooked) -  cup has 350 mcg.  Greens, collard (cooked) -  cup has 418 mcg.  Greens, turnip (cooked) -  cup has 265 mcg.  Green onions or scallions -  cup has 105 mcg.  Kale (fresh or frozen) -  cup has 531 mcg.  Parsley (raw) - 10 sprigs has 164 mcg.  Spinach (cooked) -  cup has 444 mcg.  Swiss chard (cooked) -  cup has 287 mcg.  Moderate vitamin K foods Foods that have a moderate amount of vitamin K contain 25-100 mcg per serving. These include:  Asparagus (cooked) - 5 spears have 38 mcg.  Black-eyed peas (dried) -  cup has 32 mcg.  Cabbage (cooked) -  cup has 37 mcg.  Kiwi fruit - 1 medium has 31 mcg.  Lettuce - 1 cup has 57-63 mcg.  Okra (frozen) -  cup has 44 mcg.  Prunes (dried) - 5 prunes have 25 mcg.  Watercress (raw) - 1 cup has 85 mcg.  Low vitamin K foods Foods low in vitamin K contain less than 25 mcg per serving. These include:  Artichoke - 1 medium has 18 mcg.  Avocado - 1 oz. has 6 mcg.  Blueberries -  cup has 14 mcg.  Cabbage (raw) -  cup has 21 mcg.  Carrots (cooked) -  cup has 11 mcg.  Cauliflower (raw) -  cup has 11 mcg.  Cucumber with peel (raw) -  cup has 9 mcg.  Grapes -  cup has 12 mcg.  Mango - 1 medium has 9 mcg.  Nuts - 1 oz. has 15 mcg.  Pear - 1 medium has 8 mcg.  Peas (cooked) -  cup has 19 mcg.  Pickles - 1 spear has 14 mcg.  Pumpkin seeds - 1 oz. has 13 mcg.  Sauerkraut (canned) -   cup has 16 mcg.  Soybeans (cooked) -  cup has 16 mcg.  Tomato (raw) - 1 medium has 10 mcg.  Tomato sauce -  cup has 17 mcg.  Vitamin K-free foods If a food contain less than 5 mcg per serving, it is considered to have no vitamin K. These foods include:  Bread and cereal products.  Cheese.  Eggs.  Fish and shellfish.  Meat and poultry.  Milk and dairy products.  Sunflower seeds.  Actual amounts of vitamin K in foods may be different depending on processing. Talk with your dietitian about what foods you can eat and what foods you should avoid.  This information is not intended to replace advice given to you by your health care provider. Make sure you discuss any questions you have with your health care provider.  Document Revised: 04/21/2017 Document Reviewed: 08/12/2015 Elsevier Patient Education  2020 [employer]  ==================================================================================== Pulmonary Embolism    A pulmonary embolism (PE) is a sudden blockage or decrease of blood flow in one or both lungs. Most blockages come from a blood clot that forms in the vein of a lower leg, thigh, or arm (deep vein thrombosis, DVT) and travels to the lungs. A clot is blood that has thickened into a gel or solid. PE is a dangerous and life-threatening condition that needs to be treated right away.  What are the causes? This condition is usually caused by a blood clot that forms in a vein and moves to the lungs. In rare cases, it may be caused by air, fat, part of a tumor, or other tissue that moves through the veins and into the lungs.  What increases the risk? The following factors may make you more likely to develop this condition: 5. Experiencing a traumatic injury, such as breaking a hip or leg. 6. Having: ? A spinal cord injury. ? Orthopedic surgery, especially hip or knee replacement. ? Any major surgery. ? A stroke. ? DVT. ? Blood clots or blood clotting  disease. ? Long-term (chronic) lung or heart disease. ? Cancer treated with chemotherapy. ? A central venous catheter. 7. Taking medicines that contain estrogen. These include birth control pills and hormone replacement therapy. 8. Being: ? Pregnant. ? In the period of time after your baby is delivered (postpartum). ? Older than age 87. ? Overweight. ? A smoker, especially if you have other risks.  What are the signs or symptoms? Symptoms of this condition usually  start suddenly and include:  Shortness of breath during activity or at rest.  Coughing, coughing up blood, or coughing up blood-tinged mucus.  Chest pain that is often worse with deep breaths.  Rapid or irregular heartbeat.  Feeling light-headed or dizzy.  Fainting.  Feeling anxious.  Fever.  Sweating.  Pain and swelling in a leg. This is a symptom of DVT, which can lead to PE. How is this diagnosed? This condition may be diagnosed based on:  Your medical history.  A physical exam.  Blood tests.  CT pulmonary angiogram. This test checks blood flow in and around your lungs.  Ventilation-perfusion scan, also called a lung VQ scan. This test measures air flow and blood flow to the lungs.  An ultrasound of the legs.  How is this treated? Treatment for this condition depends on many factors, such as the cause of your PE, your risk for bleeding or developing more clots, and other medical conditions you have. Treatment aims to remove, dissolve, or stop blood clots from forming or growing larger. Treatment may include: 1. Medicines, such as: ? Blood thinning medicines (anticoagulants) to stop clots from forming. ? Medicines that dissolve clots (thrombolytics). 2. Procedures, such as: ? Using a flexible tube to remove a blood clot (embolectomy) or to deliver medicine to destroy it (catheter-directed thrombolysis). ? Inserting a filter into a large vein that carries blood to the heart (inferior vena cava). This  filter (vena cava filter) catches blood clots before they reach the lungs. ? Surgery to remove the clot (surgical embolectomy). This is rare. You may need a combination of immediate, long-term (up to 3 months after diagnosis), and extended (more than 3 months after diagnosis) treatments. Your treatment may continue for several months (maintenance therapy). You and your health care provider will work together to choose the treatment program that is best for you.  Follow these instructions at home: Medicines 1. Take over-the-counter and prescription medicines only as told by your health care provider. 2. If you are taking an anticoagulant medicine: ? Take the medicine every day at the same time each day. ? Understand what foods and drugs interact with your medicine. ? Understand the side effects of this medicine, including excessive bruising or bleeding. Ask your health care provider or pharmacist about other side effects.  General instructions  Wear a medical alert bracelet or carry a medical alert card that says you have had a PE and lists what medicines you take.  Ask your health care provider when you may return to your normal activities. Avoid sitting or lying for a long time without moving.  Maintain a healthy weight. Ask your health care provider what weight is healthy for you.  Do not use any products that contain nicotine or tobacco, such as cigarettes, e-cigarettes, and chewing tobacco. If you need help quitting, ask your health care provider.  Talk with your health care provider about any travel plans. It is important to make sure that you are still able to take your medicine while on trips.  Keep all follow-up visits as told by your health care provider. This is important.  Contact a health care provider if:  You missed a dose of your blood thinner medicine.  Get help right away if: 1. You have: ? New or increased pain, swelling, warmth, or redness in an arm or  leg. ? Numbness or tingling in an arm or leg. ? Shortness of breath during activity or at rest. ? A fever. ? Chest pain. ?  A rapid or irregular heartbeat. ? A severe headache. ? Vision changes. ? A serious fall or accident, or you hit your head. ? Stomach (abdominal) pain. ? Blood in your vomit, stool, or urine. ? A cut that will not stop bleeding. 2. You cough up blood. 3. You feel light-headed or dizzy. 4. You cannot move your arms or legs. 5. You are confused or have memory loss.  These symptoms may represent a serious problem that is an emergency. Do not wait to see if the symptoms will go away. Get medical help right away. Call your local emergency services (911 in the [place] Do not drive yourself to the hospital. Summary  A pulmonary embolism (PE) is a sudden blockage or decrease of blood flow in one or both lungs. PE is a dangerous and life-threatening condition that needs to be treated right away.  Treatments for this condition usually include medicines to thin your blood (anticoagulants) or medicines to break apart blood clots (thrombolytics).  If you are given blood thinners, it is important to take the medicine every day at the same time each day.  Understand what foods and drugs interact with any medicines that you are taking.  If you have signs of PE or DVT, call your local emergency services (911 in the [place] This information is not intended to replace advice given to you by your health care provider. Make sure you discuss any questions you have with your health care provider. Document Revised: 02/14/2018 Document Reviewed: 02/14/2018 Elsevier Patient Education  2020 [employer].

## 2019-12-14 NOTE — Evaluation (Signed)
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  EF is 20-25%, on O2 via cannula and did not remove as pt required to maintain sat.  PMHx:  hepatic steatosis, EtOH abuse, CHF, DM, HTN, OSA  Clinical Impression  This 34 yo male with onset of cardiovascular compromise was evaluated for mobility, and used 2L O2 with sat 92% initially to walk on the hall using IV pole to carry O2.  Pt has already been on O2 at night PRN, and had to increase to 3L to maintain above 88% on the hallway.  Pt was returned to 2L in room after completion of visit, and did note some LE strength changes as well.  Will recommend cardiac rehab due to elements of strengthening and endurance training in a medically monitored environment, which pt reports he has already planned.  Follow acutely as needed.    Follow Up Recommendations No PT follow up;Other (comment) (cardiac rehab)    Equipment Recommendations  None recommended by PT    Recommendations for Other Services       Precautions / Restrictions Precautions Precautions: None Precaution Comments: monitor HR and sats Restrictions Weight Bearing Restrictions: No      Mobility  Bed Mobility Overal bed mobility: Modified Independent                Transfers Overall transfer level: Modified independent Equipment used: None             General transfer comment: using correct hand placement with transitions  Ambulation/Gait Ambulation/Gait assistance: Supervision Gait Distance (Feet): 150 Feet Assistive device: IV Pole Gait Pattern/deviations: Step-through pattern;Decreased stride length;Wide base of support Gait velocity: reduced Gait velocity interpretation: <1.31 ft/sec, indicative of household ambulator General Gait Details: pt is  steady on his feet with no SOB  Stairs            Wheelchair Mobility    Modified Rankin (Stroke Patients Only)       Balance Overall balance assessment: No apparent balance deficits (not formally assessed)                                           Pertinent Vitals/Pain Pain Assessment: No/denies pain    Home Living Family/patient expects to be discharged to:: Private residence Living Arrangements: Alone Available Help at Discharge: Family;Friend(s);Available PRN/intermittently Type of Home: House Home Access: Stairs to enter Entrance Stairs-Rails: Right Entrance Stairs-Number of Steps: 3 Home Layout: One level Home Equipment: None Additional Comments: has been doing work as Science writer, sitting     Prior Function Level of Independence: Independent               Hand Dominance   Dominant Hand: Right    Extremity/Trunk Assessment   Upper Extremity Assessment Upper Extremity Assessment: Overall WFL for tasks assessed    Lower Extremity Assessment Lower Extremity Assessment: Generalized weakness    Cervical / Trunk Assessment Cervical / Trunk Assessment: Normal  Communication   Communication: No difficulties  Cognition Arousal/Alertness: Awake/alert Behavior During Therapy: WFL for tasks assessed/performed Overall Cognitive Status: Within Functional Limits for tasks assessed  General Comments General comments (skin integrity, edema, etc.): pt was at 92% O2 sat baseline on 2L O2 in room, increased to 3L on hallway to accomodate mobility requirements    Exercises     Assessment/Plan    PT Assessment Patient needs continued PT services  PT Problem List Cardiopulmonary status limiting activity;Decreased strength       PT Treatment Interventions DME instruction;Gait training;Stair training;Functional mobility training;Therapeutic activities;Therapeutic exercise;Balance  training;Neuromuscular re-education;Patient/family education    PT Goals (Current goals can be found in the Care Plan section)  Acute Rehab PT Goals Patient Stated Goal: to get better and get home PT Goal Formulation: With patient Time For Goal Achievement: 12/28/19 Potential to Achieve Goals: Good    Frequency Min 3X/week   Barriers to discharge Decreased caregiver support home independently    Co-evaluation               AM-PAC PT "6 Clicks" Mobility  Outcome Measure Help needed turning from your back to your side while in a flat bed without using bedrails?: None Help needed moving from lying on your back to sitting on the side of a flat bed without using bedrails?: None Help needed moving to and from a bed to a chair (including a wheelchair)?: A Little Help needed standing up from a chair using your arms (e.g., wheelchair or bedside chair)?: A Little Help needed to walk in hospital room?: A Little Help needed climbing 3-5 steps with a railing? : A Little 6 Click Score: 20    End of Session Equipment Utilized During Treatment: Oxygen Activity Tolerance: Treatment limited secondary to medical complications (Comment) Patient left: in bed;with call bell/phone within reach;with family/visitor present Nurse Communication: Mobility status;Other (comment) (results of PT intervention) PT Visit Diagnosis: Other (comment);Muscle weakness (generalized) (M62.81) (cardiovascular effort to walk)    Time: 4540-9811 PT Time Calculation (min) (ACUTE ONLY): 29 min   Charges:   PT Evaluation $PT Eval Moderate Complexity: 1 Mod PT Treatments $Gait Training: 8-22 mins       Ivar Drape 12/14/2019, 3:26 PM  Samul Dada, PT MS Acute Rehab Dept. Number: [facility] [phone] and [facility] [phone]

## 2019-12-14 NOTE — Progress Notes (Signed)
PROGRESS NOTE  Sean Lawson KDT:[id number] DOB: Aug 24, 1985 DOA: 12/12/2019 PCP: Sean Brunette, MD  HPI/Recap of past 24 hours: HPI from Dr Sean Lawson is a 34 y.o. male with recent admission for acute systolic heart failure EF measured was 20 to 25% with new diagnosis of diabetes mellitus at that time with history of EtOH abuse which patient has stopped since last admission, morbid obesity has followed up with his primary care physician because of elevated blood sugar yesterday.  During which patient was noticed to have none productive cough and primary care physician did a chest x-ray was showing concerning for pneumonia and referred patient to the ER.  Patient denies any fever chills denies any sputum production or any chest pain and states he has lost around 60 pounds weight. In the ED, patient was mildly hypoxic and D-dimer done showed elevated at around 6.61, EKG shows sinus tachycardia patient was having intermittent hypoxia blood pressure was stable CT angiogram of the chest done shows bilateral pulmonary embolism with mild right heart strain.  Patient was started on heparin.  Labs are remarkable for sodium of 128 blood sugar of 292 high sensitive troponin of 22 and Covid test was negative.  Patient admitted for further management.    Today, patient noted to be coughing more, denied any hemoptysis, chest pain, still short of breath, denied any abdominal pain, nausea/vomiting, fever/chills.   Assessment/Plan: Principal Problem:   Acute pulmonary embolism (HCC) Active Problems:   Morbid obesity (HCC)   Hypertension   Chronic systolic CHF (congestive heart failure) (HCC)   Acute hypoxic respiratory failure likely 2/2 acute pulmonary embolism Currently requiring about 2 L of O2, noted cough Risk factor likely due to morbid obesity Troponin with a flat trend CTA chest showed extensive acute bilateral PE, with CT evidence of borderline right sided heart strain BLE  Doppler negative for DVT ECHO was done on 11/28/2019, which showed EF of 20 to 25%, very technically difficult study with poor images, repeat limited ECHO showed EF of 30 to 35%, global hypokinesis Chest x-ray showed possible pulmonary infection Start Mucinex, start Protonix to cover for possible GERD Continue IV heparin, plan to switch to NOAC tonight Supplemental oxygen as needed Telemetry  Mild AKI Resolved Creatinine 1.39 Restart Entresto, spironolactone Daily BMP  Acute systolic HF Does not appear volume overloaded Echo as above Continue Coreg, digoxin, Lasix, Entresto and spironolactone Strict I's and O's, daily weights  HTN BP stable Continue meds as above  Diabetes mellitus type 2 Uncontrolled, A1c 11.4 SSI, start Lantus, Accu-Cheks, hypoglycemic protocol  Hyponatremia Daily BMP  Morbid obesity Lifestyle modification advised  History of alcohol abuse Quit, has not had alcohol for the past 3 weeks Encouraged to continue to abstain        Malnutrition Type:      Malnutrition Characteristics:      Nutrition Interventions:       Estimated body mass index is 54.7 kg/m as calculated from the following:   Height as of this encounter: 6\' 3"  (1.905 m).   Weight as of this encounter: 198.5 kg.     Code Status: Full  Family Communication: Discussed with mother at bedside on 12/13/2019  Disposition Plan: Status is: Inpatient  Remains inpatient appropriate because:Inpatient level of care appropriate due to severity of illness   Dispo: The patient is from: Home              Anticipated d/c is to: Home  Anticipated d/c date is: 2 days              Patient currently is not medically stable to d/c.   Consultants:  None  Procedures:  None  Antimicrobials:  None  DVT prophylaxis: Heparin drip   Objective: Vitals:   12/14/19 0750 12/14/19 0850 12/14/19 1100 12/14/19 1437  BP: 97/74 123/84 125/80 (!) 128/87  Pulse: 89 93  88 90  Resp: 20  16 18   Temp: 97.8 F (36.6 C)  97.9 F (36.6 C) 98.3 F (36.8 C)  TempSrc: Oral  Oral Oral  SpO2: 94%  95% 96%  Weight:      Height:        Intake/Output Summary (Last 24 hours) at 12/14/2019 1549 Last data filed at 12/14/2019 0800 Gross per 24 hour  Intake 790.03 ml  Output 700 ml  Net 90.03 ml   Filed Weights   12/12/19 1641 12/13/19 2339  Weight: (!) 197.8 kg (!) 198.5 kg    Exam:  General: NAD, morbidly obese  Cardiovascular: S1, S2 present  Respiratory:  Diminished breath sounds bilaterally  Abdomen: Soft, nontender, obese, bowel sounds present  Musculoskeletal: Trace bilateral pedal edema noted  Skin:  Rashes/possible insect bites on bilateral lower extremity which are chronic as per patient  Psychiatry: Normal mood   Data Reviewed: CBC: Recent Labs  Lab 12/12/19 1700 12/13/19 0357 12/14/19 0459  WBC 10.0 10.0 7.8  NEUTROABS 7.3  --   --   HGB 15.3 14.2 13.9  HCT 48.1 45.3 44.2  MCV 86.7 86.0 86.2  PLT 420* 332 297   Basic Metabolic Panel: Recent Labs  Lab 12/12/19 1700 12/13/19 0357 12/14/19 0459  NA 128* 131* 133*  K 4.9 4.7 4.4  CL 88* 92* 95*  CO2 29 28 25   GLUCOSE 292* 225* 193*  BUN 26* 33* 27*  CREATININE 1.13 1.39* 0.93  CALCIUM 9.4 9.3 9.3   GFR: Estimated Creatinine Clearance: 204 mL/min (by C-G formula based on SCr of 0.93 mg/dL). Liver Function Tests: No results for input(s): AST, ALT, ALKPHOS, BILITOT, PROT, ALBUMIN in the last 168 hours. No results for input(s): LIPASE, AMYLASE in the last 168 hours. No results for input(s): AMMONIA in the last 168 hours. Coagulation Profile: Recent Labs  Lab 12/13/19 0154  INR 1.2   Cardiac Enzymes: No results for input(s): CKTOTAL, CKMB, CKMBINDEX, TROPONINI in the last 168 hours. BNP (last 3 results) No results for input(s): PROBNP in the last 8760 hours. HbA1C: No results for input(s): HGBA1C in the last 72 hours. CBG: Recent Labs  Lab 12/13/19 1210  12/13/19 1857 12/13/19 2159 12/14/19 0746 12/14/19 1146  GLUCAP 256* 197* 209* 207* 321*   Lipid Profile: No results for input(s): CHOL, HDL, LDLCALC, TRIG, CHOLHDL, LDLDIRECT in the last 72 hours. Thyroid Function Tests: No results for input(s): TSH, T4TOTAL, FREET4, T3FREE, THYROIDAB in the last 72 hours. Anemia Panel: No results for input(s): VITAMINB12, FOLATE, FERRITIN, TIBC, IRON, RETICCTPCT in the last 72 hours. Urine analysis:    Component Value Date/Time   COLORURINE AMBER (A) 11/27/2019 1545   APPEARANCEUR CLEAR 11/27/2019 1545   LABSPEC >1.030 (H) 11/27/2019 1545   PHURINE 6.0 11/27/2019 1545   GLUCOSEU 250 (A) 11/27/2019 1545   HGBUR TRACE (A) 11/27/2019 1545   BILIRUBINUR SMALL (A) 11/27/2019 1545   KETONESUR NEGATIVE 11/27/2019 1545   PROTEINUR 100 (A) 11/27/2019 1545   NITRITE NEGATIVE 11/27/2019 1545   LEUKOCYTESUR NEGATIVE 11/27/2019 1545   Sepsis Labs: @LABRCNTIP (procalcitonin:4,lacticidven:4)  )  Recent Results (from the past 240 hour(s))  SARS Coronavirus 2 by RT PCR (hospital order, performed in [facility] hospital lab) Nasopharyngeal Nasopharyngeal Swab     Status: None   Collection Time: 12/13/19  1:45 AM   Specimen: Nasopharyngeal Swab  Result Value Ref Range Status   SARS Coronavirus 2 NEGATIVE NEGATIVE Final    Comment: (NOTE) SARS-CoV-2 target nucleic acids are NOT DETECTED.  The SARS-CoV-2 RNA is generally detectable in upper and lower respiratory specimens during the acute phase of infection. The lowest concentration of SARS-CoV-2 viral copies this assay can detect is 250 copies / mL. A negative result does not preclude SARS-CoV-2 infection and should not be used as the sole basis for treatment or other patient management decisions.  A negative result may occur with improper specimen collection / handling, submission of specimen other than nasopharyngeal swab, presence of viral mutation(s) within the areas targeted by this assay, and  inadequate number of viral copies (<250 copies / mL). A negative result must be combined with clinical observations, patient history, and epidemiological information.  Fact Sheet for Patients:   BoilerBrush.com.cy  Fact Sheet for Healthcare Providers: https://pope.com/  This test is not yet approved or  cleared by the [place] FDA and has been authorized for detection and/or diagnosis of SARS-CoV-2 by FDA under an Emergency Use Authorization (EUA).  This EUA will remain in effect (meaning this test can be used) for the duration of the COVID-19 declaration under Section 564(b)(1) of the Act, 21 U.S.C. section 360bbb-3(b)(1), unless the authorization is terminated or revoked sooner.  Performed at [facility], [address], [place], [place] [address]       Studies: DG Chest Port 1 View  Result Date: 12/14/2019 CLINICAL DATA:  Shortness of breath, cough EXAM: PORTABLE CHEST 1 VIEW COMPARISON:  12/12/2019, CT chest, 12/13/2019 FINDINGS: Mild cardiomegaly. No significant interval change in subtle heterogeneous airspace opacities of the bilateral lung bases, better characterized by prior CT and consistent with pulmonary infarctions. IMPRESSION: 1.  Mild cardiomegaly. 2. No significant interval change in subtle heterogeneous airspace opacities of the bilateral lung bases, better characterized by prior CT and consistent with pulmonary infarctions. Electronically Signed   By: Lauralyn Primes M.D.   On: 12/14/2019 13:49   ECHOCARDIOGRAM LIMITED  Result Date: 12/13/2019    ECHOCARDIOGRAM LIMITED REPORT   Patient Name:   Sean Lawson Date of Exam: 12/13/2019 Medical Rec #:  [id number]       Height:       75.0 in Accession #:    [id number]      Weight:       436.0 lb Date of Birth:  1986/02/13        BSA:          3.056 m Patient Age:    34 years        BP:           127/68 mmHg Patient Gender: M               HR:           59 bpm. Exam  Location:  Inpatient Procedure: Limited Echo, Limited Color Doppler and Cardiac Doppler Indications:    pulmonary embolus 415.19  History:        Patient has prior history of Echocardiogram examinations, most                 recent 11/28/2019. CHF; Risk Factors:Hypertension.  Sonographer:  Delcie Roch Referring Phys: [id number] [facility]  Sonographer Comments: Patient is morbidly obese. Image acquisition challenging due to patient body habitus. IMPRESSIONS  1. Left ventricular ejection fraction, by estimation, is 30 to 35%. The left ventricle has moderately decreased function. The left ventricle demonstrates global hypokinesis. The left ventricular internal cavity size was moderately dilated.  2. Right ventricular systolic function is normal. The right ventricular size is mildly enlarged.  3. The mitral valve is grossly normal. Trivial mitral valve regurgitation.  4. The aortic valve was not well visualized. Aortic valve regurgitation is not visualized. No aortic stenosis is present.  5. The inferior vena cava is normal in size with greater than 50% respiratory variability, suggesting right atrial pressure of 3 mmHg. Comparison(s): Changes from prior study are noted. EF appears mildly improved. FINDINGS  Left Ventricle: Left ventricular ejection fraction, by estimation, is 30 to 35%. The left ventricle has moderately decreased function. The left ventricle demonstrates global hypokinesis. The left ventricular internal cavity size was moderately dilated. Right Ventricle: The right ventricular size is mildly enlarged. Right ventricular systolic function is normal. Pericardium: There is no evidence of pericardial effusion. Mitral Valve: The mitral valve is grossly normal. Trivial mitral valve regurgitation. Tricuspid Valve: The tricuspid valve is grossly normal. Tricuspid valve regurgitation is trivial. Aortic Valve: The aortic valve was not well visualized. Aortic valve regurgitation is not visualized. No  aortic stenosis is present. Pulmonic Valve: The pulmonic valve was grossly normal. Pulmonic valve regurgitation is trivial. Aorta: The aortic root and ascending aorta are structurally normal, with no evidence of dilitation. Pulmonary Artery: The pulmonary artery is not well seen. Venous: The inferior vena cava is normal in size with greater than 50% respiratory variability, suggesting right atrial pressure of 3 mmHg. LEFT VENTRICLE PLAX 2D LVIDd:         6.80 cm LVIDs:         5.30 cm LV PW:         1.10 cm LV IVS:        1.10 cm LVOT diam:     2.70 cm LVOT Area:     5.73 cm  RIGHT VENTRICLE             IVC RV S prime:     13.40 cm/s  IVC diam: 1.30 cm TAPSE (M-mode): 2.0 cm LEFT ATRIUM         Index      RIGHT ATRIUM           Index LA diam:    3.00 cm 0.98 cm/m RA Area:     21.40 cm                                RA Volume:   63.50 ml  20.78 ml/m   AORTA Ao Root diam: 3.60 cm Ao Asc diam:  3.50 cm MITRAL VALVE MV Area (PHT): 6.27 cm    SHUNTS MV Decel Time: 121 msec    Systemic Diam: 2.70 cm MV E velocity: 62.10 cm/s MV A velocity: 38.10 cm/s MV E/A ratio:  1.63 Jodelle Red MD Electronically signed by Jodelle Red MD Signature Date/Time: 12/13/2019/10:58:15 PM    Final     Scheduled Meds: . carvedilol  3.125 mg Oral BID WC  . digoxin  0.125 mg Oral Daily  . furosemide  80 mg Oral Daily  . guaiFENesin  600 mg Oral BID  . insulin aspart  0-9 Units Subcutaneous TID WC  . magnesium oxide  400 mg Oral Daily  . pantoprazole  40 mg Oral Daily  . sacubitril-valsartan  1 tablet Oral BID  . spironolactone  25 mg Oral Daily    Continuous Infusions: . heparin 2,550 Units/hr (12/14/19 0723)     LOS: 1 day     Briant Cedar, MD [facility]  If 7PM-7AM, please contact night-coverage [email or web address] 12/14/2019, 3:49 PM

## 2019-12-14 NOTE — Progress Notes (Signed)
ANTICOAGULATION CONSULT NOTE  Pharmacy Consult for heparin Indication: pulmonary embolus  No Known Allergies  Patient Measurements: Height: 6\' 3"  (190.5 cm) Weight: (!) 198.5 kg (437 lb 9.6 oz) IBW/kg (Calculated) : 84.5 Heparin Dosing Weight: 135kg  Vital Signs: Temp: 97.8 F (36.6 C) (07/24 0750) Temp Source: Oral (07/24 0750) BP: 97/74 (07/24 0750) Pulse Rate: 89 (07/24 0750)  Labs: Recent Labs    12/12/19 1700 12/12/19 1700 12/13/19 0154 12/13/19 0154 12/13/19 0357 12/13/19 12/15/19 12/13/19 0901 12/13/19 1748 12/14/19 0459  HGB 15.3   < >  --   --  14.2  --   --   --  13.9  HCT 48.1  --   --   --  45.3  --   --   --  44.2  PLT 420*  --   --   --  332  --   --   --  297  APTT  --   --  30  --   --   --   --   --   --   LABPROT  --   --  14.8  --   --   --   --   --   --   INR  --   --  1.2  --   --   --   --   --   --   HEPARINUNFRC  --   --  <0.10*   < >  --   --  0.19* 0.38 0.37  CREATININE 1.13  --   --   --  1.39*  --   --   --  0.93  TROPONINIHS  --   --  22*  --  22* 23*  --   --   --    < > = values in this interval not displayed.    Estimated Creatinine Clearance: 204 mL/min (by C-G formula based on SCr of 0.93 mg/dL).   Medical History: Past Medical History:  Diagnosis Date  . Chronic systolic CHF (congestive heart failure) (HCC) 12/13/2019  . Diabetes mellitus without complication (HCC)   . Hypertension     Assessment: 34yo male c/o cough x several weeks, was dx'd w/ PNA at outpt and sent to ED for further evaluation, CT reveals extensive bilateral PE w/ evidence of RHS, to begin heparin. Patient is not on anticoagulation PTA.   Heparin level therapeutic s/p rate increase yesterday to 2550 units/hr. CBC stable. No signs of bleeding per RN. Plan to transition to oral anticoagulation once stable.   Goal of Therapy:  Heparin level 0.3-0.7 units/ml Monitor platelets by anticoagulation protocol: Yes   Plan:  Continue heparin gtt at 2550  units/hr F/u heparin level with AM labs Daily heparin level, CBC, s/s bleeding  34yo, PharmD PGY1 Pharmacy Resident 12/14/2019 8:10 AM  Please check [email or web address] for unit-specific pharmacy phone numbers.

## 2019-12-14 NOTE — Progress Notes (Addendum)
ANTICOAGULATION CONSULT NOTE  Pharmacy Consult for heparin bridge to warfarin Indication: pulmonary embolus 7/23  No Known Allergies  Patient Measurements: Height: 6\' 3"  (190.5 cm) Weight: (!) 198.5 kg (437 lb 9.6 oz) IBW/kg (Calculated) : 84.5 Heparin Dosing Weight: 135kg  Vital Signs: Temp: 98.3 F (36.8 C) (07/24 1437) Temp Source: Oral (07/24 1437) BP: 128/87 (07/24 1437) Pulse Rate: 90 (07/24 1437)  Labs: Recent Labs    12/12/19 1700 12/12/19 1700 12/13/19 0154 12/13/19 0154 12/13/19 0357 12/13/19 12/15/19 12/13/19 0901 12/13/19 1748 12/14/19 0459  HGB 15.3   < >  --   --  14.2  --   --   --  13.9  HCT 48.1  --   --   --  45.3  --   --   --  44.2  PLT 420*  --   --   --  332  --   --   --  297  APTT  --   --  30  --   --   --   --   --   --   LABPROT  --   --  14.8  --   --   --   --   --   --   INR  --   --  1.2  --   --   --   --   --   --   HEPARINUNFRC  --   --  <0.10*   < >  --   --  0.19* 0.38 0.37  CREATININE 1.13  --   --   --  1.39*  --   --   --  0.93  TROPONINIHS  --   --  22*  --  22* 23*  --   --   --    < > = values in this interval not displayed.    Estimated Creatinine Clearance: 204 mL/min (by C-G formula based on SCr of 0.93 mg/dL).   Medical History: Past Medical History:  Diagnosis Date  . Chronic systolic CHF (congestive heart failure) (HCC) 12/13/2019  . Diabetes mellitus without complication (HCC)   . Hypertension     Assessment: 34yo male with extensive bilateral PE w/ evidence of RHS. Pharmacy is consulted for heparin and warfarin dosing. Patient is not on anticoagulation PTA.   Patient has been therapeutic on heparin but at low end of goal. Will increase rate slightly to target higher end given extensive PE. Plan was to transition to apixaban for PE but after discussion with MD, decided to switch to warfarin given BMI 54 and insufficient data to support apixaban use for this indication in this weight range.    Goal of Therapy:   Heparin level 0.3-0.7 units/ml (target higher end as able) Monitor platelets by anticoagulation protocol: Yes   Plan:  Increase heparin infusion at 2650 units/hr Start warfarin 15mg  x1 Monitor daily INR, HL, CBC/plt Monitor for signs/symptoms of bleeding     34yo, PharmD, BCPS, BCCP Clinical Pharmacist  Please check AMION for all [facility] Pharmacy phone numbers After 10:00 PM, call Main Pharmacy [phone]

## 2019-12-15 LAB — BASIC METABOLIC PANEL
Anion gap: 7 (ref 5–15)
BUN: 22 mg/dL — ABNORMAL HIGH (ref 6–20)
CO2: 32 mmol/L (ref 22–32)
Calcium: 9.1 mg/dL (ref 8.9–10.3)
Chloride: 95 mmol/L — ABNORMAL LOW (ref 98–111)
Creatinine, Ser: 1 mg/dL (ref 0.61–1.24)
GFR calc Af Amer: >60 mL/min (ref >60)
GFR calc non Af Amer: >60 mL/min (ref >60)
Glucose, Bld: 232 mg/dL — ABNORMAL HIGH (ref 70–99)
Potassium: 4.8 mmol/L (ref 3.5–5.1)
Sodium: 134 mmol/L — ABNORMAL LOW (ref 135–145)

## 2019-12-15 LAB — CBC
HCT: 41.9 % (ref 39.0–52.0)
Hemoglobin: 13 g/dL (ref 13.0–17.0)
MCH: 27.1 pg (ref 26.0–34.0)
MCHC: 31 g/dL (ref 30.0–36.0)
MCV: 87.3 fL (ref 80.0–100.0)
Platelets: 298 10*3/uL (ref 150–400)
RBC: 4.8 MIL/uL (ref 4.22–5.81)
RDW: 14 % (ref 11.5–15.5)
WBC: 7.7 10*3/uL (ref 4.0–10.5)
nRBC: 0 % (ref 0.0–0.2)

## 2019-12-15 LAB — GLUCOSE, CAPILLARY
Glucose-Capillary: 218 mg/dL — ABNORMAL HIGH (ref 70–99)
Glucose-Capillary: 292 mg/dL — ABNORMAL HIGH (ref 70–99)
Glucose-Capillary: 237 mg/dL — ABNORMAL HIGH (ref 70–99)
Glucose-Capillary: 208 mg/dL — ABNORMAL HIGH (ref 70–99)

## 2019-12-15 LAB — HEPARIN LEVEL (UNFRACTIONATED): Heparin Unfractionated: 0.55 IU/mL (ref 0.30–0.70)

## 2019-12-15 LAB — PROTIME-INR
INR: 1.2 (ref 0.8–1.2)
Prothrombin Time: 14.4 seconds (ref 11.4–15.2)

## 2019-12-15 MED ORDER — ZOLEDRONIC ACID 4 MG/5ML IV CONC: 4.0000 mg | Freq: Once | INTRAVENOUS | Status: DC

## 2019-12-15 MED ORDER — TRAMADOL HCL 50 MG PO TABS
50.0000 mg | ORAL_TABLET | Freq: Four times a day (QID) | ORAL | Status: DC | PRN
  Administered 2019-12-15 – 2019-12-16 (×2): 50 mg via ORAL
  Filled 2019-12-15 (×2): qty 1

## 2019-12-15 MED ORDER — WARFARIN SODIUM 7.5 MG PO TABS
15.0000 mg | ORAL_TABLET | Freq: Once | ORAL | Status: AC
  Administered 2019-12-15: 15 mg via ORAL
  Filled 2019-12-15: qty 2

## 2019-12-15 NOTE — Progress Notes (Signed)
ANTICOAGULATION CONSULT NOTE  Pharmacy Consult for heparin bridge to warfarin Indication: pulmonary embolus 7/23  No Known Allergies  Patient Measurements: Height: 6\' 3"  (190.5 cm) Weight: (!) 197 kg (434 lb 4.9 oz) IBW/kg (Calculated) : 84.5 Heparin Dosing Weight: 135kg  Vital Signs: Temp: 98 F (36.7 C) (07/25 0300) Temp Source: Oral (07/25 0300) BP: 120/79 (07/25 0801) Pulse Rate: 86 (07/25 0801)  Labs: Recent Labs    12/12/19 1700 12/13/19 0154 12/13/19 0357 12/13/19 0357 12/13/19 12/15/19 12/13/19 0901 12/13/19 1748 12/14/19 0459 12/15/19 0445  HGB   < >  --  14.2   < >  --   --   --  13.9 13.0  HCT   < >  --  45.3  --   --   --   --  44.2 41.9  PLT   < >  --  332  --   --   --   --  297 298  APTT  --  30  --   --   --   --   --   --   --   LABPROT  --  14.8  --   --   --   --   --   --  14.4  INR  --  1.2  --   --   --   --   --   --  1.2  HEPARINUNFRC  --  <0.10*  --   --   --    < > 0.38 0.37 0.55  CREATININE   < >  --  1.39*  --   --   --   --  0.93 1.00  TROPONINIHS  --  22* 22*  --  23*  --   --   --   --    < > = values in this interval not displayed.    Estimated Creatinine Clearance: 188.9 mL/min (by C-G formula based on SCr of 1 mg/dL).   Medical History: Past Medical History:  Diagnosis Date  . Chronic systolic CHF (congestive heart failure) (HCC) 12/13/2019  . Diabetes mellitus without complication (HCC)   . Hypertension     Assessment: 34yo male with extensive bilateral PE w/ evidence of RHS. Pharmacy is consulted for heparin and warfarin dosing. Patient is not on anticoagulation PTA.   This is overlap day 2 for heparin/warfarin bridge. Patient has been therapeutic on heparin but at low end of goal. Increased rate slightly last night to target higher end given extensive PE. HL is in goal range of 0.5-0.7 today. CBC wnl, hemodynamically stable.   Initial plan was to transition to apixaban for PE but after discussion with MD, decided to switch to  warfarin given BMI 54 and insufficient data to support apixaban use for this indication in this weight range.   Goal of Therapy:  Heparin level 0.3-0.7 units/ml (target higher end as able) Monitor platelets by anticoagulation protocol: Yes   Plan:  Continue heparin infusion at 2650 units/hr Warfarin 15mg  x1 today Discontinue heparin after at least 5 days when INR >2 x2 days Monitor daily INR, HL, CBC/plt Monitor for signs/symptoms of bleeding     34yo, PharmD PGY1 Pharmacy Resident 12/15/2019 10:29 AM  Please check [email or web address] for unit-specific pharmacy phone numbers.

## 2019-12-15 NOTE — Progress Notes (Signed)
PROGRESS NOTE  LYDIA MENG RSW:[id number] DOB: [birth year] DOA: 12/12/2019 PCP: [name], MD  HPI/Recap of past 24 hours: HPI from Dr [name] is a 34 y.o. male with recent admission for acute systolic heart failure EF measured was 20 to 25% with new diagnosis of diabetes mellitus at that time with history of EtOH abuse which patient has stopped since last admission, morbid obesity has followed up with his primary care physician because of elevated blood sugar yesterday.  During which patient was noticed to have none productive cough and primary care physician did a chest x-ray was showing concerning for pneumonia and referred patient to the ER.  Patient denies any fever chills denies any sputum production or any chest pain and states he has lost around 60 pounds weight. In the ED, patient was mildly hypoxic and D-dimer done showed elevated at around 6.61, EKG shows sinus tachycardia patient was having intermittent hypoxia blood pressure was stable CT angiogram of the chest done shows bilateral pulmonary embolism with mild right heart strain.  Patient was started on heparin.  Labs are remarkable for sodium of 128 blood sugar of 292 high sensitive troponin of 22 and Covid test was negative.  Patient admitted for further management.      Today, patient still noted to be coughing, denied any subjective worsening shortness of breath, denies any chest pain, abdominal pain, nausea/vomiting, fever/chills.   Assessment/Plan: Principal Problem:   Acute pulmonary embolism (HCC) Active Problems:   Morbid obesity (HCC)   Hypertension   Chronic systolic CHF (congestive heart failure) (HCC)   Acute hypoxic respiratory failure likely 2/2 acute pulmonary embolism Currently requiring about 2 L of O2, noted cough Risk factor likely due to morbid obesity Troponin with a flat trend CTA chest showed extensive acute bilateral PE, with CT evidence of borderline right sided heart  strain BLE Doppler negative for DVT ECHO was done on 11/28/2019, which showed EF of 20 to 25%, very technically difficult study with poor images, repeat limited ECHO showed EF of 30 to 35%, global hypokinesis Chest x-ray showed possible pulmonary infarction Start Mucinex, start Protonix to cover for possible GERD Continue IV heparin and p.o. warfarin Supplemental oxygen as needed Telemetry  Mild AKI Resolved Creatinine 1.39 Restart Entresto, spironolactone Daily BMP  Acute systolic HF Does not appear volume overloaded Echo as above Continue Coreg, digoxin, Lasix, Entresto and spironolactone Strict I's and O's, daily weights  HTN BP stable Continue meds as above  Diabetes mellitus type 2 Uncontrolled, A1c 11.4 SSI, start Lantus, Accu-Cheks, hypoglycemic protocol  Hyponatremia Daily BMP  Morbid obesity Lifestyle modification advised  History of alcohol abuse Quit, has not had alcohol for the past 3 weeks Encouraged to continue to abstain        Malnutrition Type:      Malnutrition Characteristics:      Nutrition Interventions:       Estimated body mass index is 54.28 kg/m as calculated from the following:   Height as of this encounter: 6\' 3"  (1.905 m).   Weight as of this encounter: 197 kg.     Code Status: Full  Family Communication: Discussed with mother at bedside on 12/15/2019  Disposition Plan: Status is: Inpatient  Remains inpatient appropriate because:Inpatient level of care appropriate due to severity of illness   Dispo: The patient is from: Home              Anticipated d/c is to: Home  Anticipated d/c date is: 1 day              Patient currently is not medically stable to d/c.   Consultants:  None  Procedures:  None  Antimicrobials:  None  DVT prophylaxis: Heparin drip, Coumadin   Objective: Vitals:   12/15/19 0801 12/15/19 0804 12/15/19 1034 12/15/19 1318  BP: 120/79 120/79 115/74 108/81  Pulse: 86  88 85 88  Resp:    20  Temp:    98.3 F (36.8 C)  TempSrc:    Oral  SpO2:  95% 95% 94%  Weight:      Height:        Intake/Output Summary (Last 24 hours) at 12/15/2019 1704 Last data filed at 12/15/2019 1000 Gross per 24 hour  Intake 873.84 ml  Output --  Net 873.84 ml   Filed Weights   12/12/19 1641 12/13/19 2339 12/15/19 0300  Weight: (!) 197.8 kg (!) 198.5 kg (!) 197 kg    Exam:  General: NAD, morbidly obese  Cardiovascular: S1, S2 present  Respiratory:  Diminished breath sounds bilaterally  Abdomen: Soft, nontender, obese, bowel sounds present  Musculoskeletal: Trace bilateral pedal edema noted  Skin:  Rashes/possible insect bites on bilateral lower extremity which are chronic as per patient  Psychiatry: Normal mood   Data Reviewed: CBC: Recent Labs  Lab 12/12/19 1700 12/13/19 0357 12/14/19 0459 12/15/19 0445  WBC 10.0 10.0 7.8 7.7  NEUTROABS 7.3  --   --   --   HGB 15.3 14.2 13.9 13.0  HCT 48.1 45.3 44.2 41.9  MCV 86.7 86.0 86.2 87.3  PLT 420* 332 297 298   Basic Metabolic Panel: Recent Labs  Lab 12/12/19 1700 12/13/19 0357 12/14/19 0459 12/15/19 0445  NA 128* 131* 133* 134*  K 4.9 4.7 4.4 4.8  CL 88* 92* 95* 95*  CO2 29 28 25  32  GLUCOSE 292* 225* 193* 232*  BUN 26* 33* 27* 22*  CREATININE 1.13 1.39* 0.93 1.00  CALCIUM 9.4 9.3 9.3 9.1   GFR: Estimated Creatinine Clearance: 188.9 mL/min (by C-G formula based on SCr of 1 mg/dL). Liver Function Tests: No results for input(s): AST, ALT, ALKPHOS, BILITOT, PROT, ALBUMIN in the last 168 hours. No results for input(s): LIPASE, AMYLASE in the last 168 hours. No results for input(s): AMMONIA in the last 168 hours. Coagulation Profile: Recent Labs  Lab 12/13/19 0154 12/15/19 0445  INR 1.2 1.2   Cardiac Enzymes: No results for input(s): CKTOTAL, CKMB, CKMBINDEX, TROPONINI in the last 168 hours. BNP (last 3 results) No results for input(s): PROBNP in the last 8760 hours. HbA1C: No results  for input(s): HGBA1C in the last 72 hours. CBG: Recent Labs  Lab 12/14/19 1657 12/14/19 2049 12/15/19 0727 12/15/19 1119 12/15/19 1639  GLUCAP 262* 231* 218* 292* 237*   Lipid Profile: No results for input(s): CHOL, HDL, LDLCALC, TRIG, CHOLHDL, LDLDIRECT in the last 72 hours. Thyroid Function Tests: No results for input(s): TSH, T4TOTAL, FREET4, T3FREE, THYROIDAB in the last 72 hours. Anemia Panel: No results for input(s): VITAMINB12, FOLATE, FERRITIN, TIBC, IRON, RETICCTPCT in the last 72 hours. Urine analysis:    Component Value Date/Time   COLORURINE AMBER (A) 11/27/2019 1545   APPEARANCEUR CLEAR 11/27/2019 1545   LABSPEC >1.030 (H) 11/27/2019 1545   PHURINE 6.0 11/27/2019 1545   GLUCOSEU 250 (A) 11/27/2019 1545   HGBUR TRACE (A) 11/27/2019 1545   BILIRUBINUR SMALL (A) 11/27/2019 1545   KETONESUR NEGATIVE 11/27/2019 1545   PROTEINUR 100 (  A) 11/27/2019 1545   NITRITE NEGATIVE 11/27/2019 1545   LEUKOCYTESUR NEGATIVE 11/27/2019 1545   Sepsis Labs: @LABRCNTIP (procalcitonin:4,lacticidven:4)  ) Recent Results (from the past 240 hour(s))  SARS Coronavirus 2 by RT PCR (hospital order, performed in [facility] hospital lab) Nasopharyngeal Nasopharyngeal Swab     Status: None   Collection Time: 12/13/19  1:45 AM   Specimen: Nasopharyngeal Swab  Result Value Ref Range Status   SARS Coronavirus 2 NEGATIVE NEGATIVE Final    Comment: (NOTE) SARS-CoV-2 target nucleic acids are NOT DETECTED.  The SARS-CoV-2 RNA is generally detectable in upper and lower respiratory specimens during the acute phase of infection. The lowest concentration of SARS-CoV-2 viral copies this assay can detect is 250 copies / mL. A negative result does not preclude SARS-CoV-2 infection and should not be used as the sole basis for treatment or other patient management decisions.  A negative result may occur with improper specimen collection / handling, submission of specimen other than nasopharyngeal  swab, presence of viral mutation(s) within the areas targeted by this assay, and inadequate number of viral copies (<250 copies / mL). A negative result must be combined with clinical observations, patient history, and epidemiological information.  Fact Sheet for Patients:   12/15/19  Fact Sheet for Healthcare Providers: BoilerBrush.com.cy  This test is not yet approved or  cleared by the https://pope.com/ FDA and has been authorized for detection and/or diagnosis of SARS-CoV-2 by FDA under an Emergency Use Authorization (EUA).  This EUA will remain in effect (meaning this test can be used) for the duration of the COVID-19 declaration under Section 564(b)(1) of the Act, 21 U.S.C. section 360bbb-3(b)(1), unless the authorization is terminated or revoked sooner.  Performed at [facility], [address] [place], [place]       Studies: No results found.  Scheduled Meds: . carvedilol  3.125 mg Oral BID WC  . digoxin  0.125 mg Oral Daily  . furosemide  80 mg Oral Daily  . guaiFENesin  600 mg Oral BID  . insulin aspart  0-9 Units Subcutaneous TID WC  . insulin glargine  10 Units Subcutaneous Daily  . magnesium oxide  400 mg Oral Daily  . pantoprazole  40 mg Oral Daily  . sacubitril-valsartan  1 tablet Oral BID  . spironolactone  25 mg Oral Daily  . warfarin  15 mg Oral ONCE-1600  . Warfarin - Pharmacist Dosing Inpatient   Does not apply q1600    Continuous Infusions: . heparin 2,650 Units/hr (12/15/19 1327)     LOS: 2 days     12/17/19, MD [facility]  If 7PM-7AM, please contact night-coverage [email or web address] 12/15/2019, 5:04 PM

## 2019-12-15 NOTE — Progress Notes (Signed)
Inpatient Diabetes Program Recommendations  AACE/ADA: New Consensus Statement on Inpatient Glycemic Control (2015)  Target Ranges:  Prepandial:   less than 140 mg/dL      Peak postprandial:   less than 180 mg/dL (1-2 hours)      Critically ill patients:  140 - 180 mg/dL   Lab Results  Component Value Date   GLUCAP 218 (H) 12/15/2019   HGBA1C 11.4 (H) 11/28/2019    Review of Glycemic Control  Diabetes history: Dm 2 new diagnosis earlier this month 7/8 Outpatient Diabetes medications: was d/c'd only on metformin Current orders for Inpatient glycemic control:  Lantus 10 units Novolog 0-9 units tid  Inpatient Diabetes Program Recommendations:    - Increase Novolog Correction scale to "moderate" 0-15 units tid + hs coverage  - Increase Lantus to 15 units  This coordinator spoke with pt earlier this month when pt was newly diagnosed with DM and did extensive education.  It was recommended that pt to be d/c'd on 2 orals agents Metformin and either Sulfonylurea/SGLT-2 at time of d/c. Pt only d/c'd on metformin. Pt only required sliding scale coverage at that time.  Will follow glucose trends.  Thanks,  Christena Deem RN, MSN, BC-ADM Inpatient Diabetes Coordinator Team Pager [phone] (8a-5p)

## 2019-12-15 NOTE — Evaluation (Addendum)
Occupational Therapy Evaluation and Discharge Patient Details Name: Sean Lawson MRN: [id number] DOB: 1986/05/06 Today's Date: 12/15/2019    History of Present Illness 34 yo male with onset of acute PE was admitted, found to have heart strain, CHF, pulm infarcts related to PE.  EF is 20-25%, on O2 via cannula and did not remove as pt required to maintain sat.  PMHx:  hepatic steatosis, EtOH abuse, CHF, DM, HTN, OSA   Clinical Impression   This 34 yo male admitted with above presents to acute OT at an independent level with basic ADLs and  all education completed on energy conservation. No further OT needs, we will sign off. Pt's sats did drop to 86% on RA with increase to 94% on 2 liters of O2. Set pt up with O2 tank on IV pole for him to be able to ambulate in hallways with his mom on 2 liters of O2.    Follow Up Recommendations  No OT follow up    Equipment Recommendations  None recommended by OT    Recommendations for Other Services  (cardiac rehab)     Precautions / Restrictions Precautions Precautions: None Precaution Comments: monitor HR and sats Restrictions Weight Bearing Restrictions: No      Mobility Bed Mobility Overal bed mobility: Independent                Transfers Overall transfer level: Independent Equipment used: None                  Balance Overall balance assessment: Independent                                         ADL either performed or assessed with clinical judgement   ADL Overall ADL's : Independent                                       General ADL Comments: Educated pt on purse lipped breathing in increments of 5 if he feels short of breath; use O2 when showering; have door open to bathroom when showering, vent fan on in bathroom, use luke warm water--all of this will help with breathing while taking shower; don't get in a hot car (cool down before you get in--pt reports he has a remote  start and always does this); listen to your body, if it is telling you to rest then do so--this might mean taking a shower and then waiting to get dressed     Vision Patient Visual Report: No change from baseline              Pertinent Vitals/Pain Pain Assessment: No/denies pain     Hand Dominance Right   Extremity/Trunk Assessment Upper Extremity Assessment Upper Extremity Assessment: Overall WFL for tasks assessed           Communication Communication Communication: No difficulties   Cognition Arousal/Alertness: Awake/alert Behavior During Therapy: WFL for tasks assessed/performed Overall Cognitive Status: Within Functional Limits for tasks assessed                                                Home Living Family/patient expects to be discharged to::  Private residence   Available Help at Discharge: Family;Friend(s);Available PRN/intermittently Type of Home: House Home Access: Stairs to enter [employer] of Steps: 3 Entrance Stairs-Rails: Right Home Layout: One level     Bathroom Shower/Tub: Walk-in shower;Door   [employer] Toilet: Handicapped height     Home Equipment: Information systems manager - built in;Hand held shower head   Additional Comments: works as a Lexicographer at [employer]      Prior Functioning/Environment Level of Independence: Independent                 OT Problem List: Cardiopulmonary status limiting activity         OT Goals(Current goals can be found in the care plan section) Acute Rehab OT Goals Patient Stated Goal: to hopefully go home today  OT Frequency:                AM-PAC OT "6 Clicks" Daily Activity     Outcome Measure Help from another person eating meals?: None Help from another person taking care of personal grooming?: None Help from another person toileting, which includes using toliet, bedpan, or urinal?: None Help from another person bathing (including  washing, rinsing, drying)?: None Help from another person to put on and taking off regular upper body clothing?: None Help from another person to put on and taking off regular lower body clothing?: None 6 Click Score: 24   End of Session Equipment Utilized During Treatment: Oxygen (RA, but had to turn up to 2 liters due to drop in sats to 86% on RA while ambulating)  Activity Tolerance: Patient tolerated treatment well Patient left: in bed;with call bell/phone within reach;with family/visitor present  OT Visit Diagnosis: Muscle weakness (generalized) (M62.81)                Time: 5462-7035 OT Time Calculation (min): 22 min Charges:  OT General Charges $OT Visit: 1 Visit OT Evaluation $OT Eval Moderate Complexity: 1 Mod  Ignacia Palma, OTR/L Acute [employer] Pager [phone] Office [phone]     Evette Georges 12/15/2019, 9:51 AM

## 2019-12-16 LAB — CBC
HCT: 43.3 % (ref 39.0–52.0)
Hemoglobin: 13.3 g/dL (ref 13.0–17.0)
MCH: 26.9 pg (ref 26.0–34.0)
MCHC: 30.7 g/dL (ref 30.0–36.0)
MCV: 87.5 fL (ref 80.0–100.0)
Platelets: 327 10*3/uL (ref 150–400)
RBC: 4.95 MIL/uL (ref 4.22–5.81)
RDW: 14.2 % (ref 11.5–15.5)
WBC: 8.5 10*3/uL (ref 4.0–10.5)
nRBC: 0 % (ref 0.0–0.2)

## 2019-12-16 LAB — BASIC METABOLIC PANEL
Anion gap: 9 (ref 5–15)
BUN: 21 mg/dL — ABNORMAL HIGH (ref 6–20)
CO2: 31 mmol/L (ref 22–32)
Calcium: 9.2 mg/dL (ref 8.9–10.3)
Chloride: 94 mmol/L — ABNORMAL LOW (ref 98–111)
Creatinine, Ser: 1.04 mg/dL (ref 0.61–1.24)
GFR calc Af Amer: >60 mL/min (ref >60)
GFR calc non Af Amer: >60 mL/min (ref >60)
Glucose, Bld: 234 mg/dL — ABNORMAL HIGH (ref 70–99)
Potassium: 4.6 mmol/L (ref 3.5–5.1)
Sodium: 134 mmol/L — ABNORMAL LOW (ref 135–145)

## 2019-12-16 LAB — GLUCOSE, CAPILLARY
Glucose-Capillary: 209 mg/dL — ABNORMAL HIGH (ref 70–99)
Glucose-Capillary: 272 mg/dL — ABNORMAL HIGH (ref 70–99)
Glucose-Capillary: 213 mg/dL — ABNORMAL HIGH (ref 70–99)
Glucose-Capillary: 211 mg/dL — ABNORMAL HIGH (ref 70–99)
Glucose-Capillary: 219 mg/dL — ABNORMAL HIGH (ref 70–99)

## 2019-12-16 LAB — HEPARIN LEVEL (UNFRACTIONATED): Heparin Unfractionated: 0.58 IU/mL (ref 0.30–0.70)

## 2019-12-16 LAB — PROTIME-INR
INR: 1.5 — ABNORMAL HIGH (ref 0.8–1.2)
Prothrombin Time: 17.7 seconds — ABNORMAL HIGH (ref 11.4–15.2)

## 2019-12-16 MED ORDER — INSULIN GLARGINE 100 UNIT/ML ~~LOC~~ SOLN
15.0000 [IU] | Freq: Every day | SUBCUTANEOUS | Status: DC
  Administered 2019-12-17: 15 [IU] via SUBCUTANEOUS
  Filled 2019-12-16: qty 0.15

## 2019-12-16 MED ORDER — DAPAGLIFLOZIN PROPANEDIOL 10 MG PO TABS
10.0000 mg | ORAL_TABLET | Freq: Every day | ORAL | Status: DC
  Administered 2019-12-17: 10 mg via ORAL
  Filled 2019-12-16: qty 1

## 2019-12-16 MED ORDER — BENZONATATE 100 MG PO CAPS
200.0000 mg | ORAL_CAPSULE | Freq: Two times a day (BID) | ORAL | Status: DC | PRN
  Administered 2019-12-16 – 2019-12-17 (×2): 200 mg via ORAL
  Filled 2019-12-16 (×2): qty 2

## 2019-12-16 MED ORDER — TRAMADOL HCL 50 MG PO TABS
50.0000 mg | ORAL_TABLET | Freq: Four times a day (QID) | ORAL | Status: DC | PRN
  Administered 2019-12-16 – 2019-12-17 (×2): 50 mg via ORAL
  Filled 2019-12-16 (×2): qty 1

## 2019-12-16 MED ORDER — INSULIN ASPART 100 UNIT/ML ~~LOC~~ SOLN
0.0000 [IU] | Freq: Every day | SUBCUTANEOUS | Status: DC
  Administered 2019-12-16: 2 [IU] via SUBCUTANEOUS

## 2019-12-16 MED ORDER — WARFARIN SODIUM 10 MG PO TABS
10.0000 mg | ORAL_TABLET | Freq: Once | ORAL | Status: AC
  Administered 2019-12-16: 10 mg via ORAL
  Filled 2019-12-16: qty 1

## 2019-12-16 MED ORDER — INSULIN ASPART 100 UNIT/ML ~~LOC~~ SOLN
0.0000 [IU] | Freq: Three times a day (TID) | SUBCUTANEOUS | Status: DC
  Administered 2019-12-16 (×2): 8 [IU] via SUBCUTANEOUS
  Administered 2019-12-17: 3 [IU] via SUBCUTANEOUS
  Administered 2019-12-17: 5 [IU] via SUBCUTANEOUS

## 2019-12-16 NOTE — Progress Notes (Signed)
PROGRESS NOTE  Sean Lawson:[id number] DOB: [birth year] DOA: 12/12/2019 PCP: [name], MD  HPI/Recap of past 24 hours: HPI from Dr [name] is a 34 y.o. male with recent admission for acute systolic heart failure EF measured was 20 to 25% with new diagnosis of diabetes mellitus at that time with history of EtOH abuse which patient has stopped since last admission, morbid obesity has followed up with his primary care physician because of elevated blood sugar yesterday.  During which patient was noticed to have none productive cough and primary care physician did a chest x-ray was showing concerning for pneumonia and referred patient to the ER.  Patient denies any fever chills denies any sputum production or any chest pain and states he has lost around 60 pounds weight. In the ED, patient was mildly hypoxic and D-dimer done showed elevated at around 6.61, EKG shows sinus tachycardia patient was having intermittent hypoxia blood pressure was stable CT angiogram of the chest done shows bilateral pulmonary embolism with mild right heart strain.  Patient was started on heparin.  Labs are remarkable for sodium of 128 blood sugar of 292 high sensitive troponin of 22 and Covid test was negative.  Patient admitted for further management.     Today, patient still complaining of cough, which comes in spells randomly, also reports lower back pain.  Denies any worsening shortness of breath, chest pain, abdominal pain, nausea/vomiting, fever/chills.  Father at bedside.      Assessment/Plan: Principal Problem:   Acute pulmonary embolism (HCC) Active Problems:   Morbid obesity (HCC)   Hypertension   Chronic systolic CHF (congestive heart failure) (HCC)   Acute hypoxic respiratory failure likely 2/2 acute pulmonary embolism Currently requiring about 2 L of O2, noted cough Risk factor likely due to morbid obesity Troponin with a flat trend CTA chest showed extensive acute  bilateral PE, with CT evidence of borderline right sided heart strain BLE Doppler negative for DVT ECHO was done on 11/28/2019, which showed EF of 20 to 25%, very technically difficult study with poor images, repeat limited ECHO showed EF of 30 to 35%, global hypokinesis Chest x-ray showed possible pulmonary infarction Start Mucinex, start Protonix to cover for possible GERD Continue IV heparin and p.o. warfarin (due to patient's BMI, unable to use NOAC or bridge with Lovenox, discussed with pharmacy) Supplemental oxygen as needed Telemetry  Mild AKI Resolved Creatinine 1.39 Restart Entresto, spironolactone Daily BMP  Acute systolic HF Does not appear volume overloaded Echo as above Continue Coreg, digoxin, Lasix, Entresto and spironolactone Strict I's and O's, daily weights  HTN BP stable Continue meds as above  Diabetes mellitus type 2 Uncontrolled with hyperglycemia, A1c 11.4 SSI, start Lantus, Accu-Cheks, hypoglycemic protocol  Hyponatremia Daily BMP  Morbid obesity Lifestyle modification advised  History of alcohol abuse Quit, has not had alcohol for the past 3 weeks Encouraged to continue to abstain        Malnutrition Type:      Malnutrition Characteristics:      Nutrition Interventions:       Estimated body mass index is 54.57 kg/m as calculated from the following:   Height as of this encounter: 6\' 3"  (1.905 m).   Weight as of this encounter: 198 kg.     Code Status: Full  Family Communication: Discussed with Father at bedside on 12/16/2019  Disposition Plan: Status is: Inpatient  Remains inpatient appropriate because:Inpatient level of care appropriate due to severity of illness  Dispo: The patient is from: Home              Anticipated d/c is to: Home              Anticipated d/c date is: 2 days              Patient currently is not medically stable to  d/c.   Consultants:  None  Procedures:  None  Antimicrobials:  None  DVT prophylaxis: Heparin drip, Coumadin   Objective: Vitals:   12/15/19 1749 12/15/19 2122 12/16/19 0447 12/16/19 0830  BP: 104/84 111/80  111/71  Pulse: 93 92  87  Resp:  14 18   Temp:  97.9 F (36.6 C) 98.6 F (37 C)   TempSrc:  Oral Oral   SpO2:  95%    Weight:   (!) 198 kg   Height:        Intake/Output Summary (Last 24 hours) at 12/16/2019 1429 Last data filed at 12/16/2019 0834 Gross per 24 hour  Intake 1182 ml  Output 450 ml  Net 732 ml   Filed Weights   12/13/19 2339 12/15/19 0300 12/16/19 0447  Weight: (!) 198.5 kg (!) 197 kg (!) 198 kg    Exam:  General: NAD, morbidly obese  Cardiovascular: S1, S2 present  Respiratory:  Diminished breath sounds bilaterally  Abdomen: Soft, nontender, obese, bowel sounds present  Musculoskeletal: Trace bilateral pedal edema noted  Skin:  Rashes/possible insect bites on bilateral lower extremity which are chronic as per patient  Psychiatry: Normal mood   Data Reviewed: CBC: Recent Labs  Lab 12/12/19 1700 12/13/19 0357 12/14/19 0459 12/15/19 0445 12/16/19 0332  WBC 10.0 10.0 7.8 7.7 8.5  NEUTROABS 7.3  --   --   --   --   HGB 15.3 14.2 13.9 13.0 13.3  HCT 48.1 45.3 44.2 41.9 43.3  MCV 86.7 86.0 86.2 87.3 87.5  PLT 420* 332 297 298 327   Basic Metabolic Panel: Recent Labs  Lab 12/12/19 1700 12/13/19 0357 12/14/19 0459 12/15/19 0445 12/16/19 0332  NA 128* 131* 133* 134* 134*  K 4.9 4.7 4.4 4.8 4.6  CL 88* 92* 95* 95* 94*  CO2 29 28 25  32 31  GLUCOSE 292* 225* 193* 232* 234*  BUN 26* 33* 27* 22* 21*  CREATININE 1.13 1.39* 0.93 1.00 1.04  CALCIUM 9.4 9.3 9.3 9.1 9.2   GFR: Estimated Creatinine Clearance: 182.2 mL/min (by C-G formula based on SCr of 1.04 mg/dL). Liver Function Tests: No results for input(s): AST, ALT, ALKPHOS, BILITOT, PROT, ALBUMIN in the last 168 hours. No results for input(s): LIPASE, AMYLASE in  the last 168 hours. No results for input(s): AMMONIA in the last 168 hours. Coagulation Profile: Recent Labs  Lab 12/13/19 0154 12/15/19 0445 12/16/19 0332  INR 1.2 1.2 1.5*   Cardiac Enzymes: No results for input(s): CKTOTAL, CKMB, CKMBINDEX, TROPONINI in the last 168 hours. BNP (last 3 results) No results for input(s): PROBNP in the last 8760 hours. HbA1C: No results for input(s): HGBA1C in the last 72 hours. CBG: Recent Labs  Lab 12/15/19 1119 12/15/19 1639 12/15/19 2031 12/16/19 0800 12/16/19 1225  GLUCAP 292* 237* 208* 209* 272*   Lipid Profile: No results for input(s): CHOL, HDL, LDLCALC, TRIG, CHOLHDL, LDLDIRECT in the last 72 hours. Thyroid Function Tests: No results for input(s): TSH, T4TOTAL, FREET4, T3FREE, THYROIDAB in the last 72 hours. Anemia Panel: No results for input(s): VITAMINB12, FOLATE, FERRITIN, TIBC, IRON, RETICCTPCT in the last 72  hours. Urine analysis:    Component Value Date/Time   COLORURINE AMBER (A) 11/27/2019 1545   APPEARANCEUR CLEAR 11/27/2019 1545   LABSPEC >1.030 (H) 11/27/2019 1545   PHURINE 6.0 11/27/2019 1545   GLUCOSEU 250 (A) 11/27/2019 1545   HGBUR TRACE (A) 11/27/2019 1545   BILIRUBINUR SMALL (A) 11/27/2019 1545   KETONESUR NEGATIVE 11/27/2019 1545   PROTEINUR 100 (A) 11/27/2019 1545   NITRITE NEGATIVE 11/27/2019 1545   LEUKOCYTESUR NEGATIVE 11/27/2019 1545   Sepsis Labs: @LABRCNTIP (procalcitonin:4,lacticidven:4)  ) Recent Results (from the past 240 hour(s))  SARS Coronavirus 2 by RT PCR (hospital order, performed in [facility] hospital lab) Nasopharyngeal Nasopharyngeal Swab     Status: None   Collection Time: 12/13/19  1:45 AM   Specimen: Nasopharyngeal Swab  Result Value Ref Range Status   SARS Coronavirus 2 NEGATIVE NEGATIVE Final    Comment: (NOTE) SARS-CoV-2 target nucleic acids are NOT DETECTED.  The SARS-CoV-2 RNA is generally detectable in upper and lower respiratory specimens during the acute phase of  infection. The lowest concentration of SARS-CoV-2 viral copies this assay can detect is 250 copies / mL. A negative result does not preclude SARS-CoV-2 infection and should not be used as the sole basis for treatment or other patient management decisions.  A negative result may occur with improper specimen collection / handling, submission of specimen other than nasopharyngeal swab, presence of viral mutation(s) within the areas targeted by this assay, and inadequate number of viral copies (<250 copies / mL). A negative result must be combined with clinical observations, patient history, and epidemiological information.  Fact Sheet for Patients:   12/15/19  Fact Sheet for Healthcare Providers: BoilerBrush.com.cy  This test is not yet approved or  cleared by the https://pope.com/ FDA and has been authorized for detection and/or diagnosis of SARS-CoV-2 by FDA under an Emergency Use Authorization (EUA).  This EUA will remain in effect (meaning this test can be used) for the duration of the COVID-19 declaration under Section 564(b)(1) of the Act, 21 U.S.C. section 360bbb-3(b)(1), unless the authorization is terminated or revoked sooner.  Performed at [facility], [address] [place], [place]       Studies: No results found.  Scheduled Meds: . carvedilol  3.125 mg Oral BID WC  . digoxin  0.125 mg Oral Daily  . furosemide  80 mg Oral Daily  . guaiFENesin  600 mg Oral BID  . insulin aspart  0-15 Units Subcutaneous TID WC  . insulin aspart  0-5 Units Subcutaneous QHS  . insulin glargine  10 Units Subcutaneous Daily  . magnesium oxide  400 mg Oral Daily  . pantoprazole  40 mg Oral Daily  . sacubitril-valsartan  1 tablet Oral BID  . spironolactone  25 mg Oral Daily  . warfarin  10 mg Oral ONCE-1600  . Warfarin - Pharmacist Dosing Inpatient   Does not apply q1600    Continuous Infusions: . heparin 2,650  Units/hr (12/16/19 0834)     LOS: 3 days     12/18/19, MD [facility]  If 7PM-7AM, please contact night-coverage [email or web address] 12/16/2019, 2:29 PM

## 2019-12-16 NOTE — Progress Notes (Signed)
ANTICOAGULATION CONSULT NOTE  Pharmacy Consult for heparin bridge to warfarin Indication: pulmonary embolus 7/23  No Known Allergies  Patient Measurements: Height: 6\' 3"  (190.5 cm) Weight: (!) 198 kg (436 lb 9.6 oz) IBW/kg (Calculated) : 84.5 Heparin Dosing Weight: 135kg  Vital Signs: Temp: 98.6 F (37 C) (07/26 0447) Temp Source: Oral (07/26 0447) BP: 111/80 (07/25 2122) Pulse Rate: 92 (07/25 2122)  Labs: Recent Labs    12/14/19 0459 12/14/19 0459 12/15/19 0445 12/16/19 0332  HGB 13.9   < > 13.0 13.3  HCT 44.2  --  41.9 43.3  PLT 297  --  298 327  LABPROT  --   --  14.4 17.7*  INR  --   --  1.2 1.5*  HEPARINUNFRC 0.37  --  0.55 0.58  CREATININE 0.93  --  1.00 1.04   < > = values in this interval not displayed.    Estimated Creatinine Clearance: 182.2 mL/min (by C-G formula based on SCr of 1.04 mg/dL).   Medical History: Past Medical History:  Diagnosis Date  . Chronic systolic CHF (congestive heart failure) (HCC) 12/13/2019  . Diabetes mellitus without complication (HCC)   . Hypertension     Assessment: 34yo male with extensive bilateral PE w/ evidence of RHS. Pharmacy is consulted for heparin and warfarin dosing. Patient is not on anticoagulation PTA.   This is overlap day 3 for heparin/warfarin bridge. Increased rate slightly on 7/24 to target higher end of heparin goal given extensive PE. HL is in goal range of 0.5-0.7 today. CBC wnl, hemodynamically stable.   Initial plan was to transition to apixaban for PE but after discussion with MD, decided to switch to warfarin given BMI 54 and insufficient data to support apixaban use for this indication in this weight range.   Patient received warfarin 15mg  x2 with steady INR increase. Will decrease dose today in anticipation of INR increase.   Goal of Therapy:  Heparin level 0.3-0.7 units/ml (target higher end as able) Monitor platelets by anticoagulation protocol: Yes   Plan:  Continue heparin infusion at  2650 units/hr Warfarin 10mg  x1 today Discontinue heparin after at least 5 days when INR >2 x2 days Monitor daily INR, HL, CBC/plt Monitor for signs/symptoms of bleeding     8/24, PharmD PGY-1 Acute Care Pharmacy Resident 12/16/2019 7:55 AM  Please check [email or web address] for unit-specific pharmacy phone numbers.

## 2019-12-17 LAB — BASIC METABOLIC PANEL
Anion gap: 6 (ref 5–15)
BUN: 20 mg/dL (ref 6–20)
CO2: 33 mmol/L — ABNORMAL HIGH (ref 22–32)
Calcium: 8.9 mg/dL (ref 8.9–10.3)
Chloride: 92 mmol/L — ABNORMAL LOW (ref 98–111)
Creatinine, Ser: 0.94 mg/dL (ref 0.61–1.24)
GFR calc Af Amer: >60 mL/min (ref >60)
GFR calc non Af Amer: >60 mL/min (ref >60)
Glucose, Bld: 212 mg/dL — ABNORMAL HIGH (ref 70–99)
Potassium: 4.4 mmol/L (ref 3.5–5.1)
Sodium: 131 mmol/L — ABNORMAL LOW (ref 135–145)

## 2019-12-17 LAB — CBC
HCT: 41.3 % (ref 39.0–52.0)
Hemoglobin: 12.7 g/dL — ABNORMAL LOW (ref 13.0–17.0)
MCH: 26.9 pg (ref 26.0–34.0)
MCHC: 30.8 g/dL (ref 30.0–36.0)
MCV: 87.5 fL (ref 80.0–100.0)
Platelets: 309 10*3/uL (ref 150–400)
RBC: 4.72 MIL/uL (ref 4.22–5.81)
RDW: 14.2 % (ref 11.5–15.5)
WBC: 8.1 10*3/uL (ref 4.0–10.5)
nRBC: 0 % (ref 0.0–0.2)

## 2019-12-17 LAB — GLUCOSE, CAPILLARY
Glucose-Capillary: 197 mg/dL — ABNORMAL HIGH (ref 70–99)
Glucose-Capillary: 223 mg/dL — ABNORMAL HIGH (ref 70–99)

## 2019-12-17 LAB — HEPARIN LEVEL (UNFRACTIONATED): Heparin Unfractionated: 0.6 IU/mL (ref 0.30–0.70)

## 2019-12-17 LAB — PROTIME-INR
INR: 2 — ABNORMAL HIGH (ref 0.8–1.2)
Prothrombin Time: 21.8 seconds — ABNORMAL HIGH (ref 11.4–15.2)

## 2019-12-17 MED ORDER — PANTOPRAZOLE SODIUM 40 MG PO TBEC: 40.0000 mg | DELAYED_RELEASE_TABLET | Freq: Every day | ORAL | 0 refills | Status: AC

## 2019-12-17 MED ORDER — WARFARIN SODIUM 5 MG PO TABS: 7.5000 mg | ORAL_TABLET | Freq: Every day | ORAL | 0 refills | Status: DC

## 2019-12-17 MED ORDER — ENOXAPARIN SODIUM 300 MG/3ML IJ SOLN
1.0000 mg/kg | Freq: Once | INTRAMUSCULAR | Status: AC
  Administered 2019-12-17: 200 mg via SUBCUTANEOUS
  Filled 2019-12-17: qty 2

## 2019-12-17 MED ORDER — TRAMADOL HCL 50 MG PO TABS: 50.0000 mg | ORAL_TABLET | Freq: Three times a day (TID) | ORAL | 0 refills | Status: AC | PRN

## 2019-12-17 MED ORDER — WARFARIN SODIUM 7.5 MG PO TABS: 7.5000 mg | ORAL_TABLET | Freq: Once | ORAL | Status: DC

## 2019-12-17 MED ORDER — BENZONATATE 200 MG PO CAPS: 200.0000 mg | ORAL_CAPSULE | Freq: Two times a day (BID) | ORAL | 0 refills | Status: DC | PRN

## 2019-12-17 MED ORDER — WARFARIN SODIUM 7.5 MG PO TABS
7.5000 mg | ORAL_TABLET | Freq: Once | ORAL | Status: AC
  Administered 2019-12-17: 7.5 mg via ORAL
  Filled 2019-12-17: qty 1

## 2019-12-17 MED ORDER — DAPAGLIFLOZIN PROPANEDIOL 10 MG PO TABS: 10.0000 mg | ORAL_TABLET | Freq: Every day | ORAL | 0 refills | Status: DC

## 2019-12-17 MED ORDER — GUAIFENESIN ER 600 MG PO TB12: 600.0000 mg | ORAL_TABLET | Freq: Two times a day (BID) | ORAL | 0 refills | Status: AC | PRN

## 2019-12-17 NOTE — Discharge Summary (Signed)
Discharge Summary  Sean Lawson ICH:[id number] DOB: 08-25-1985  PCP: [name], MD  Admit date: 12/12/2019 Discharge date: 12/17/2019  Time spent: 45 mins  Recommendations for Outpatient Follow-up:  1. PCP in 1 week 2. INR/anticoagulation clinic as scheduled-with PCP on 12/19/2019 3. Cardiology/HF as scheduled    Discharge Diagnoses:  Active Hospital Problems   Diagnosis Date Noted  . Acute pulmonary embolism (HCC) 12/13/2019  . Chronic systolic CHF (congestive heart failure) (HCC) 12/13/2019  . Hypertension 11/28/2019  . Morbid obesity (HCC) 11/28/2019    Resolved Hospital Problems  No resolved problems to display.    Discharge Condition: Stable  Diet recommendation: Heart healthy/modified carb  Vitals:   12/17/19 0447 12/17/19 0830  BP: (!) 132/91 126/82  Pulse: 98 87  Resp: 18   Temp: 97.8 F (36.6 C)   SpO2: 94%     History of present illness:  Sean Lawson a 34 y.o.malewithrecent admission for acute systolic heart failure EF measured was 20 to 25% with new diagnosis of diabetes mellitus at that time with history of EtOH abuse which patient has stopped since last admission, morbid obesity has followed up with his primary care physician because of elevated blood sugar yesterday. During which patient was noticed to have none productive cough and primary care physician did a chest x-ray was showing concerning for pneumonia and referred patient to the ER. Patient denies any fever chills denies any sputum production or any chest pain and states he has lost around 60 pounds weight. In the ED, patient was mildly hypoxic and D-dimer done showed elevated at around 6.61, EKG shows sinus tachycardia patient was having intermittent hypoxia blood pressure was stable CT angiogram of the chest done shows bilateral pulmonary embolism with mild right heart strain. Patient was started on heparin. Labs are remarkable for sodium of 128 blood sugar of 292 high sensitive  troponin of 22 and Covid test was negative.  Patient admitted for further management.     Today, patient still reported some cough, nonproductive, denied any chest pain, worsening shortness of breath at rest and on ambulation, denied any abdominal pain, nausea/vomiting, denied any fever/chills.  Reports acute on chronic back pain.  Very eager to be discharged.  Mother at bedside, discharge plan discussed extensively with patient and mother.  Discussed discharge plans with pharmacist regarding anticoagulation with Coumadin, and follow-up for INR checks   Hospital Course:  Principal Problem:   Acute pulmonary embolism (HCC) Active Problems:   Morbid obesity (HCC)   Hypertension   Chronic systolic CHF (congestive heart failure) (HCC)   Acute hypoxic respiratory failure likely 2/2 acute pulmonary embolism Currently requiring about 2 L of O2, noted cough Risk factor likely due to morbid obesity Troponin with a flat trend CTA chest showed extensive acute bilateral PE, with CT evidence of borderline right sided heart strain BLE Doppler negative for DVT ECHO was done on 11/28/2019, which showed EF of 20 to 25%, very technically difficult study with poor images, repeat limited ECHO showed EF of 30 to 35%, global hypokinesis Chest x-ray showed possible pulmonary infarction Continue Mucinex/Tessalon as needed, start Protonix to cover for possible GERD S/P IV heparin Continue warfarin (due to patient's BMI, unable to use NOAC or bridge with Lovenox, discussed with pharmacy) Supplemental oxygen as needed Follow-up with PCP in 1 week  Mild AKI Resolved Creatinine 1.39 Continue Entresto, spironolactone Follow-up with PCP  Acute systolic HF Does not appear volume overloaded Echo as above Continue Coreg, digoxin, Lasix, Entresto and  spironolactone Follow-up with HF clinic  HTN BP stable Continue meds as above  Diabetes mellitus type 2 Uncontrolled with hyperglycemia, A1c  11.4 Continue home Metformin, and recently started [place]  Hyponatremia Follow-up with PCP/HF clinic  Morbid obesity Lifestyle modification advised  History of alcohol abuse Quit, has not had alcohol for the past 3 weeks Encouraged to continue to abstain         Malnutrition Type:      Malnutrition Characteristics:      Nutrition Interventions:      Estimated body mass index is 54.71 kg/m as calculated from the following:   Height as of this encounter: 6\' 3"  (1.905 m).   Weight as of this encounter: 198.5 kg.    Procedures:  None  Consultations:  None  Discharge Exam: BP 126/82   Pulse 87   Temp 97.8 F (36.6 C) (Oral)   Resp 18   Ht 6\' 3"  (1.905 m)   Wt (!) 198.5 kg   SpO2 94%   BMI 54.71 kg/m   General: NAD Cardiovascular: S1, S2 present Respiratory: Diminished breath sounds bilaterally  Discharge Instructions You were cared for by a hospitalist during your hospital stay. If you have any questions about your discharge medications or the care you received while you were in the hospital after you are discharged, you can call the unit and asked to speak with the hospitalist on call if the hospitalist that took care of you is not available. Once you are discharged, your primary care physician will handle any further medical issues. Please note that NO REFILLS for any discharge medications will be authorized once you are discharged, as it is imperative that you return to your primary care physician (or establish a relationship with a primary care physician if you do not have one) for your aftercare needs so that they can reassess your need for medications and monitor your lab values.  Discharge Instructions    Diet - low sodium heart healthy   Complete by: As directed    Increase activity slowly   Complete by: As directed      Allergies as of 12/17/2019   No Known Allergies     Medication List    TAKE these medications   acetaminophen  500 MG tablet Commonly known as: TYLENOL Take 1,000 mg by mouth every 6 (six) hours as needed for mild pain.   benzonatate 200 MG capsule Commonly known as: TESSALON Take 1 capsule (200 mg total) by mouth 2 (two) times daily as needed for cough.   carvedilol 3.125 MG tablet Commonly known as: COREG Take 1 tablet (3.125 mg total) by mouth 2 (two) times daily with a meal.   dapagliflozin propanediol 10 MG Tabs tablet Commonly known as: FARXIGA Take 1 tablet (10 mg total) by mouth daily. Start taking on: December 18, 2019   digoxin 0.125 MG tablet Commonly known as: LANOXIN Take 1 tablet (0.125 mg total) by mouth daily.   furosemide 80 MG tablet Commonly known as: Lasix Take 1 tablet (80 mg total) by mouth daily.   guaiFENesin 600 MG 12 hr tablet Commonly known as: MUCINEX Take 1 tablet (600 mg total) by mouth 2 (two) times daily as needed for up to 7 days.   magnesium oxide 400 (241.3 Mg) MG tablet Commonly known as: MAG-OX Take 1 tablet (400 mg total) by mouth daily.   metFORMIN 500 MG tablet Commonly known as: Glucophage Take 1 tablet (500 mg total) by mouth 2 (two) times daily  with a meal.   pantoprazole 40 MG tablet Commonly known as: PROTONIX Take 1 tablet (40 mg total) by mouth daily. Start taking on: December 18, 2019   sacubitril-valsartan 49-51 MG Commonly known as: ENTRESTO Take 1 tablet by mouth 2 (two) times daily.   spironolactone 25 MG tablet Commonly known as: ALDACTONE Take 1 tablet (25 mg total) by mouth daily.   traMADol 50 MG tablet Commonly known as: ULTRAM Take 1 tablet (50 mg total) by mouth every 8 (eight) hours as needed for up to 3 days for moderate pain or severe pain.   warfarin 5 MG tablet Commonly known as: COUMADIN Take 1.5 tablets (7.5 mg total) by mouth daily at 4 PM.      No Known Allergies  Follow-up Information    [name], MD. Schedule an appointment as soon as possible for a visit in 1 week(s).   Specialty: Internal  Medicine Contact information: [address] [place] [address] [place] [address] [phone]        Jake Bathe, MD .   Specialty: Cardiology Contact information: [phone] [address]  [place] [address] [phone]                The results of significant diagnostics from this hospitalization (including imaging, microbiology, ancillary and laboratory) are listed below for reference.    Significant Diagnostic Studies: DG Chest 2 View  Result Date: 12/12/2019 CLINICAL DATA:  Cough, pneumonia EXAM: CHEST - 2 VIEW COMPARISON:  12/12/2019 at 11:45 a.m., 12/02/2019 FINDINGS: The lungs are well expanded and are symmetric. Streaky bibasilar pulmonary infiltrates are present, in keeping with atypical infection or aspiration in the appropriate clinical setting. No pneumothorax or pleural effusion. Cardiac size within normal limits. Pulmonary vascularity is normal. IMPRESSION: Streaky bibasilar pulmonary infiltrates, infection versus aspiration. Electronically Signed   By: Helyn Numbers MD   On: 12/12/2019 17:42   DG Chest 2 View  Result Date: 11/27/2019 CLINICAL DATA:  Bilateral lower extremity swelling and abdominal bloating for 6 days. EXAM: CHEST - 2 VIEW COMPARISON:  None. FINDINGS: There is cardiomegaly and mild interstitial edema. No consolidative process, pneumothorax or pleural effusion. No acute or focal bony abnormality. IMPRESSION: Cardiomegaly and mild interstitial edema. Electronically Signed   By: Drusilla Kanner M.D.   On: 11/27/2019 14:01   CT Angio Chest PE W and/or Wo Contrast  Result Date: 12/13/2019 CLINICAL DATA:  PE suspected.  Cough. EXAM: CT ANGIOGRAPHY CHEST WITH CONTRAST TECHNIQUE: Multidetector CT imaging of the chest was performed using the standard protocol during bolus administration of intravenous contrast. Multiplanar CT image reconstructions and MIPs were obtained to evaluate the vascular anatomy. CONTRAST:  OMNIPAQUE IOHEXOL 350  MG/ML SOLN COMPARISON:  None. FINDINGS: Cardiovascular: Contrast injection is sufficient to demonstrate satisfactory opacification of the pulmonary arteries to the segmental level. There are acute bilateral pulmonary emboli. On the right, there is an acute, near occlusive thrombus involving the main right pulmonary artery extending into the lobar branches of the right lower lobe, right middle lobe, and right upper lobe. On the left, there are acute, near occlusive pulmonary emboli involving the left lower lobe and left upper lobe in a lobar, segmental, and subsegmental distribution. The heart size is significantly enlarged. There is borderline right heart strain with the RV/LV ratio measuring approximately 0.9. There is no evidence for thoracic aortic dissection or aneurysm. No significant pericardial effusion. Mediastinum/Nodes: -- No mediastinal lymphadenopathy. -- No hilar lymphadenopathy. -- No axillary lymphadenopathy. -- No supraclavicular  lymphadenopathy. -- Normal thyroid gland where visualized. -  Unremarkable esophagus. Lungs/Pleura: There is no pneumothorax. There are pulmonary opacities at the lung bases bilaterally favored to represent areas of pulmonary infarction. There is no significant pleural effusion. Upper Abdomen: Contrast bolus timing is not optimized for evaluation of the abdominal organs. There appears to be underlying hepatic steatosis. Musculoskeletal: No chest wall abnormality. No bony spinal canal stenosis. Review of the MIP images confirms the above findings. IMPRESSION: 1. Extensive acute bilateral pulmonary emboli as detailed above with CT evidence for borderline right-sided heart strain with the RV/LV ratio measuring approximately 0.9. 2. Cardiomegaly. 3. Bibasilar airspace opacities favored to represent pulmonary infarcts in the setting of acute pulmonary emboli. Multifocal pneumonia is felt to be less likely. 4. Questionable hepatic steatosis. These results were called by telephone  at the time of interpretation on 12/13/2019 at 1:29 am to provider [facility] , who verbally acknowledged these results. Electronically Signed   By: Katherine Mantle M.D.   On: 12/13/2019 01:34   CARDIAC CATHETERIZATION  Result Date: 12/02/2019 1. Mildly elevated left and right heart filling pressures with pulmonary venous hypertension. 2. Low cardiac output (CI 2.0). 3. No significant coronary disease, nonischemic cardiomyopathy.   DG Chest Port 1 View  Result Date: 12/14/2019 CLINICAL DATA:  Shortness of breath, cough EXAM: PORTABLE CHEST 1 VIEW COMPARISON:  12/12/2019, CT chest, 12/13/2019 FINDINGS: Mild cardiomegaly. No significant interval change in subtle heterogeneous airspace opacities of the bilateral lung bases, better characterized by prior CT and consistent with pulmonary infarctions. IMPRESSION: 1.  Mild cardiomegaly. 2. No significant interval change in subtle heterogeneous airspace opacities of the bilateral lung bases, better characterized by prior CT and consistent with pulmonary infarctions. Electronically Signed   By: Lauralyn Primes M.D.   On: 12/14/2019 13:49   DG CHEST PORT 1 VIEW  Result Date: 12/02/2019 CLINICAL DATA:  Central line placement EXAM: PORTABLE CHEST 1 VIEW COMPARISON:  11/27/2019 FINDINGS: Examination is slightly limited secondary to poor penetration related to patient body habitus. A right-sided PICC line is visualized with the distal tip appearing to terminate in the region of the right subclavian vein. Cardiomegaly is similar to prior. Prominent perihilar and bibasilar interstitial markings, which may be slightly progressed in the right lung base. No appreciable pleural fluid collection. No pneumothorax. IMPRESSION: 1. Right-sided PICC line with the distal tip appearing to terminate in the region of the right subclavian vein. 2. Cardiomegaly. Cardiac contours raise the possibility of an underlying pericardial effusion. 3. Prominent perihilar and bibasilar  interstitial markings, which may be slightly progressed in the right lung base. Electronically Signed   By: Duanne Guess D.O.   On: 12/02/2019 15:00   ECHOCARDIOGRAM COMPLETE  Result Date: 11/28/2019    ECHOCARDIOGRAM REPORT   Patient Name:   Sean Lawson Schools Date of Exam: 11/28/2019 Medical Rec #:  [id number]       Height:       74.2 in Accession #:    [id number]      Weight:       509.5 lb Date of Birth:  [birth year]        BSA:          3.240 m Patient Age:    34 years        BP:           113/79 mmHg Patient Gender: M               HR:  105 bpm. Exam Location:  Inpatient Procedure: 2D Echo, Cardiac Doppler, Color Doppler and Intracardiac            Opacification Agent Indications:    CHF-Acute Systolic 428.21 / I50.21  History:        Patient has no prior history of Echocardiogram examinations.                 Risk Factors:Hypertension and Non-Smoker.  Sonographer:    Renella Cunas RDCS Referring Phys: [id number] MATTHEW A CARLISLE  Sonographer Comments: Patient is morbidly obese and Technically difficult study due to poor echo windows. IMPRESSIONS  1. Left ventricular ejection fraction, by estimation, is 20 to 25%. The left ventricle has severely decreased function. Left ventricular endocardial border not optimally defined to evaluate regional wall motion. The left ventricular internal cavity size  was mildly dilated. Left ventricular diastolic parameters are indeterminate.  2. Right ventricular systolic function was not well visualized. The right ventricular size is not well visualized. Tricuspid regurgitation signal is inadequate for assessing PA pressure.  3. The mitral valve was not well visualized. No evidence of mitral valve regurgitation. No evidence of mitral stenosis.  4. The aortic valve is tricuspid. Aortic valve regurgitation is not visualized. No aortic stenosis is present.  5. Aortic dilatation noted. There is mild dilatation of the aortic root measuring 38 mm.  6. The inferior vena cava is  normal in size with greater than 50% respiratory variability, suggesting right atrial pressure of 3 mmHg.  7. Very technically difficult study with poor images. FINDINGS  Left Ventricle: Left ventricular ejection fraction, by estimation, is 20 to 25%. The left ventricle has severely decreased function. Left ventricular endocardial border not optimally defined to evaluate regional wall motion. Definity contrast agent was given IV to delineate the left ventricular endocardial borders. The left ventricular internal cavity size was mildly dilated. There is no left ventricular hypertrophy. Left ventricular diastolic parameters are indeterminate. Right Ventricle: The right ventricular size is not well visualized. Right vetricular wall thickness was not assessed. Right ventricular systolic function was not well visualized. Tricuspid regurgitation signal is inadequate for assessing PA pressure. Left Atrium: Left atrial size was normal in size. Right Atrium: Right atrial size was not well visualized. Pericardium: There is no evidence of pericardial effusion. Mitral Valve: The mitral valve was not well visualized. No evidence of mitral valve regurgitation. No evidence of mitral valve stenosis. Tricuspid Valve: The tricuspid valve is not well visualized. Tricuspid valve regurgitation is not demonstrated. Aortic Valve: The aortic valve is tricuspid. Aortic valve regurgitation is not visualized. No aortic stenosis is present. Pulmonic Valve: The pulmonic valve was normal in structure. Pulmonic valve regurgitation is not visualized. Aorta: Aortic dilatation noted. There is mild dilatation of the aortic root measuring 38 mm. Venous: The inferior vena cava was not well visualized. The inferior vena cava is normal in size with greater than 50% respiratory variability, suggesting right atrial pressure of 3 mmHg. IAS/Shunts: The interatrial septum was not well visualized.  LEFT VENTRICLE PLAX 2D LVOT diam:     2.40 cm LV SV:          50 LV SV Index:   15 LVOT Area:     4.52 cm  LEFT ATRIUM             Index LA Vol (A2C):   75.8 ml 23.39 ml/m LA Vol (A4C):   87.1 ml 26.88 ml/m LA Biplane Vol: 90.1 ml 27.81 ml/m  AORTIC VALVE LVOT Vmax:  72.60 cm/s LVOT Vmean:  54.200 cm/s LVOT VTI:    0.110 m  AORTA Ao Root diam: 3.80 cm  SHUNTS Systemic VTI:  0.11 m Systemic Diam: 2.40 cm [name] Electronically signed by [name] Signature Date/Time: 11/28/2019/4:17:05 PM    Final    US SCROTUM W/DOPPLER  Result Date: 11/29/2019 CLINICAL DATA:  Scrotal edema for 5 days, RIGHT takes obscured pain for 1 day EXAM: SCROTAL ULTRASOUND DOPPLER ULTRASOUND OF THE TESTICLES TECHNIQUE: Complete ultrasound examination of the testicles, epididymis, and other scrotal structures was performed. Color and spectral Doppler ultrasound were also utilized to evaluate blood flow to the testicles. COMPARISON:  None FINDINGS: Right testicle Measurements: 3.8 x 2.2 x 2.4 cm. Normal echogenicity without mass or calcification. Internal blood flow present on color Doppler imaging. Left testicle Measurements: 3.7 x 2.5 x 2.4 cm. Normal echogenicity without mass or calcification. Internal blood flow present on color Doppler imaging. Right epididymis:  Normal in size and appearance. Left epididymis:  Normal in size and appearance. Hydrocele:  Small BILATERAL hydroceles Varicocele:  None visualized. Pulsed Doppler interrogation of both testes demonstrates normal low resistance arterial and venous waveforms bilaterally. Marked scrotal swelling bilaterally. Scrotal wall measures in excess of 4 cm thick. Striations of fluid/edema diffusely throughout the scrotal wall. Few scattered areas of linear increased nonshadowing echogenicity are identified within the scrotal wall, likely fat planes. No shadowing echogenic foci are seen to suggest calcification or gas. IMPRESSION: Normal testes and epididymi. Marked scrotal wall thickening/edema. No definite echogenic or shadowing  foci are identified to suggest gas/Fournier's gangrene though if this is a clinical concern recommend CT. Findings called to Dr. Anne Fu on 11/29/2019 at 1522 hours. Electronically Signed   By: Ulyses Southward M.D.   On: 11/29/2019 15:24   VAS [place] LOWER EXTREMITY VENOUS (DVT)  Result Date: 12/13/2019  Lower Venous DVTStudy Indications: Edema.  Limitations: Body habitus and poor ultrasound/tissue interface. Comparison Study: no prior Performing Technologist: Blanch Media RVS  Examination Guidelines: A complete evaluation includes B-mode imaging, spectral Doppler, color Doppler, and power Doppler as needed of all accessible portions of each vessel. Bilateral testing is considered an integral part of a complete examination. Limited examinations for reoccurring indications may be performed as noted. The reflux portion of the exam is performed with the patient in reverse Trendelenburg.  +---------+---------------+---------+-----------+----------+--------------+ RIGHT    CompressibilityPhasicitySpontaneityPropertiesThrombus Aging +---------+---------------+---------+-----------+----------+--------------+ CFV      Full           Yes      Yes                                 +---------+---------------+---------+-----------+----------+--------------+ SFJ      Full                                                        +---------+---------------+---------+-----------+----------+--------------+ FV Prox  Full                                                        +---------+---------------+---------+-----------+----------+--------------+ FV Mid   Full                                                        +---------+---------------+---------+-----------+----------+--------------+  FV DistalFull                                                        +---------+---------------+---------+-----------+----------+--------------+ PFV      Full                                                         +---------+---------------+---------+-----------+----------+--------------+ POP      Full           Yes      Yes                                 +---------+---------------+---------+-----------+----------+--------------+ PTV      Full                                                        +---------+---------------+---------+-----------+----------+--------------+ PERO                                                  Not visualized +---------+---------------+---------+-----------+----------+--------------+   +---------+---------------+---------+-----------+----------+--------------+ LEFT     CompressibilityPhasicitySpontaneityPropertiesThrombus Aging +---------+---------------+---------+-----------+----------+--------------+ CFV      Full           Yes      Yes                                 +---------+---------------+---------+-----------+----------+--------------+ SFJ      Full                                                        +---------+---------------+---------+-----------+----------+--------------+ FV Prox  Full                                                        +---------+---------------+---------+-----------+----------+--------------+ FV Mid   Full                                                        +---------+---------------+---------+-----------+----------+--------------+ FV DistalFull                                                        +---------+---------------+---------+-----------+----------+--------------+  PFV      Full                                                        +---------+---------------+---------+-----------+----------+--------------+ POP      Full           Yes      Yes                                 +---------+---------------+---------+-----------+----------+--------------+ PTV      Full                                                         +---------+---------------+---------+-----------+----------+--------------+ PERO                                                  Not visualized +---------+---------------+---------+-----------+----------+--------------+     Summary: BILATERAL: - No evidence of deep vein thrombosis seen in the lower extremities, bilaterally. - No evidence of superficial venous thrombosis in the lower extremities, bilaterally. -   *See table(s) above for measurements and observations. Electronically signed by Sherald Hess MD on 12/13/2019 at 12:48:28 PM.    Final    ECHOCARDIOGRAM LIMITED  Result Date: 12/13/2019    ECHOCARDIOGRAM LIMITED REPORT   Patient Name:   Sean Lawson Gioffre Date of Exam: 12/13/2019 Medical Rec #:  [id number]       Height:       75.0 in Accession #:    [id number]      Weight:       436.0 lb Date of Birth:  [birth year]        BSA:          3.056 m Patient Age:    34 years        BP:           127/68 mmHg Patient Gender: M               HR:           59 bpm. Exam Location:  Inpatient Procedure: Limited Echo, Limited Color Doppler and Cardiac Doppler Indications:    pulmonary embolus 415.19  History:        Patient has prior history of Echocardiogram examinations, most                 recent 11/28/2019. CHF; Risk Factors:Hypertension.  Sonographer:    Delcie Roch Referring Phys: [id number] [facility] J Haly Feher  Sonographer Comments: Patient is morbidly obese. Image acquisition challenging due to patient body habitus. IMPRESSIONS  1. Left ventricular ejection fraction, by estimation, is 30 to 35%. The left ventricle has moderately decreased function. The left ventricle demonstrates global hypokinesis. The left ventricular internal cavity size was moderately dilated.  2. Right ventricular systolic function is normal. The right ventricular size is mildly enlarged.  3. The mitral valve is grossly normal. Trivial mitral valve regurgitation.  4. The aortic valve was not well visualized. Aortic  valve  regurgitation is not visualized. No aortic stenosis is present.  5. The inferior vena cava is normal in size with greater than 50% respiratory variability, suggesting right atrial pressure of 3 mmHg. Comparison(s): Changes from prior study are noted. EF appears mildly improved. FINDINGS  Left Ventricle: Left ventricular ejection fraction, by estimation, is 30 to 35%. The left ventricle has moderately decreased function. The left ventricle demonstrates global hypokinesis. The left ventricular internal cavity size was moderately dilated. Right Ventricle: The right ventricular size is mildly enlarged. Right ventricular systolic function is normal. Pericardium: There is no evidence of pericardial effusion. Mitral Valve: The mitral valve is grossly normal. Trivial mitral valve regurgitation. Tricuspid Valve: The tricuspid valve is grossly normal. Tricuspid valve regurgitation is trivial. Aortic Valve: The aortic valve was not well visualized. Aortic valve regurgitation is not visualized. No aortic stenosis is present. Pulmonic Valve: The pulmonic valve was grossly normal. Pulmonic valve regurgitation is trivial. Aorta: The aortic root and ascending aorta are structurally normal, with no evidence of dilitation. Pulmonary Artery: The pulmonary artery is not well seen. Venous: The inferior vena cava is normal in size with greater than 50% respiratory variability, suggesting right atrial pressure of 3 mmHg. LEFT VENTRICLE PLAX 2D LVIDd:         6.80 cm LVIDs:         5.30 cm LV PW:         1.10 cm LV IVS:        1.10 cm LVOT diam:     2.70 cm LVOT Area:     5.73 cm  RIGHT VENTRICLE             IVC RV S prime:     13.40 cm/s  IVC diam: 1.30 cm TAPSE (M-mode): 2.0 cm LEFT ATRIUM         Index      RIGHT ATRIUM           Index LA diam:    3.00 cm 0.98 cm/m RA Area:     21.40 cm                                RA Volume:   63.50 ml  20.78 ml/m   AORTA Ao Root diam: 3.60 cm Ao Asc diam:  3.50 cm MITRAL VALVE MV Area (PHT):  6.27 cm    SHUNTS MV Decel Time: 121 msec    Systemic Diam: 2.70 cm MV E velocity: 62.10 cm/s MV A velocity: 38.10 cm/s MV E/A ratio:  1.63 Jodelle Red MD Electronically signed by Jodelle Red MD Signature Date/Time: 12/13/2019/10:58:15 PM    Final    [place] EKG SITE RITE  Result Date: 11/29/2019 If Site Rite image not attached, placement could not be confirmed due to current cardiac rhythm.   Microbiology: Recent Results (from the past 240 hour(s))  SARS Coronavirus 2 by RT PCR (hospital order, performed in [facility] hospital lab) Nasopharyngeal Nasopharyngeal Swab     Status: None   Collection Time: 12/13/19  1:45 AM   Specimen: Nasopharyngeal Swab  Result Value Ref Range Status   SARS Coronavirus 2 NEGATIVE NEGATIVE Final    Comment: (NOTE) SARS-CoV-2 target nucleic acids are NOT DETECTED.  The SARS-CoV-2 RNA is generally detectable in upper and lower respiratory specimens during the acute phase of infection. The lowest concentration of SARS-CoV-2 viral copies this assay can detect is 250 copies / mL. A negative  result does not preclude SARS-CoV-2 infection and should not be used as the sole basis for treatment or other patient management decisions.  A negative result may occur with improper specimen collection / handling, submission of specimen other than nasopharyngeal swab, presence of viral mutation(s) within the areas targeted by this assay, and inadequate number of viral copies (<250 copies / mL). A negative result must be combined with clinical observations, patient history, and epidemiological information.  Fact Sheet for Patients:   BoilerBrush.com.cy  Fact Sheet for Healthcare Providers: https://pope.com/  This test is not yet approved or  cleared by the [place] FDA and has been authorized for detection and/or diagnosis of SARS-CoV-2 by FDA under an Emergency Use Authorization (EUA).  This EUA will  remain in effect (meaning this test can be used) for the duration of the COVID-19 declaration under Section 564(b)(1) of the Act, 21 U.S.C. section 360bbb-3(b)(1), unless the authorization is terminated or revoked sooner.  Performed at [facility], [address], [place], [place] [address]      Labs: Basic Metabolic Panel: Recent Labs  Lab 12/13/19 0357 12/14/19 0459 12/15/19 0445 12/16/19 0332 12/17/19 0401  NA 131* 133* 134* 134* 131*  K 4.7 4.4 4.8 4.6 4.4  CL 92* 95* 95* 94* 92*  CO2 28 25 32 31 33*  GLUCOSE 225* 193* 232* 234* 212*  BUN 33* 27* 22* 21* 20  CREATININE 1.39* 0.93 1.00 1.04 0.94  CALCIUM 9.3 9.3 9.1 9.2 8.9   Liver Function Tests: No results for input(s): AST, ALT, ALKPHOS, BILITOT, PROT, ALBUMIN in the last 168 hours. No results for input(s): LIPASE, AMYLASE in the last 168 hours. No results for input(s): AMMONIA in the last 168 hours. CBC: Recent Labs  Lab 12/12/19 1700 12/12/19 1700 12/13/19 0357 12/14/19 0459 12/15/19 0445 12/16/19 0332 12/17/19 0401  WBC 10.0   < > 10.0 7.8 7.7 8.5 8.1  NEUTROABS 7.3  --   --   --   --   --   --   HGB 15.3   < > 14.2 13.9 13.0 13.3 12.7*  HCT 48.1   < > 45.3 44.2 41.9 43.3 41.3  MCV 86.7   < > 86.0 86.2 87.3 87.5 87.5  PLT 420*   < > 332 297 298 327 309   < > = values in this interval not displayed.   Cardiac Enzymes: No results for input(s): CKTOTAL, CKMB, CKMBINDEX, TROPONINI in the last 168 hours. BNP: BNP (last 3 results) Recent Labs    11/27/19 1317  BNP 441.1*    ProBNP (last 3 results) No results for input(s): PROBNP in the last 8760 hours.  CBG: Recent Labs  Lab 12/16/19 1800 12/16/19 2020 12/16/19 2125 12/17/19 0757 12/17/19 1206  GLUCAP 213* 211* 219* 197* 223*       Signed:  Briant Cedar, MD [facility] 12/17/2019, 12:54 PM

## 2019-12-17 NOTE — Progress Notes (Signed)
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  EF is 20-25%, on O2 via cannula and did not remove as pt required to maintain sat.  PMHx:  hepatic steatosis, EtOH abuse, CHF, DM, HTN, OSA    PT Comments    Pt received in bed with c/o back pain. Agreeable to hallway ambulation with PT. He demonstrates independence with bed mobility and transfers. Supervision provided for ambulation, as well as IV pole/port O2 management. He ambulated 400' without AD on 2L O2 with SpO2 93%. SpO2 100% on 2L at rest.    Follow Up Recommendations  No PT follow up;Other (comment) (cardiac rehab)     Equipment Recommendations  None recommended by PT    Recommendations for Other Services       Precautions / Restrictions Precautions Precautions: None    Mobility  Bed Mobility Overal bed mobility: Independent                Transfers Overall transfer level: Independent                  Ambulation/Gait Ambulation/Gait assistance: Supervision Gait Distance (Feet): 400 Feet Assistive device: None Gait Pattern/deviations: Step-through pattern;Decreased stride length Gait velocity: decreased Gait velocity interpretation: 1.31 - 2.62 ft/sec, indicative of limited community ambulator General Gait Details: Ambulated without AD on 2L O2 with SpO2 93%.   Stairs             Wheelchair Mobility    Modified Rankin (Stroke Patients Only)       Balance Overall balance assessment: Independent                                          Cognition Arousal/Alertness: Awake/alert Behavior During Therapy: WFL for tasks assessed/performed Overall Cognitive Status: Within Functional Limits for tasks assessed                                         Exercises      General Comments General comments (skin integrity, edema, etc.): SpO2 100% at rest on 2L O2. Desat to 93% during ambulation on 2L.      Pertinent Vitals/Pain Pain Assessment: Faces Faces Pain Scale: Hurts even more Pain Location: back Pain Descriptors / Indicators: Sore;Tightness Pain Intervention(s): Monitored during session;Repositioned    Home Living                      Prior Function            PT Goals (current goals can now be found in the care plan section) Acute Rehab PT Goals Patient Stated Goal: home Progress towards PT goals: Progressing toward goals    Frequency    Min 3X/week      PT Plan Current plan remains appropriate    Co-evaluation              AM-PAC PT "6 Clicks" Mobility   Outcome Measure  Help needed turning from your back to your side while in a flat bed without using bedrails?: None Help needed moving from lying on your  back to sitting on the side of a flat bed without using bedrails?: None Help needed moving to and from a bed to a chair (including a wheelchair)?: None Help needed standing up from a chair using your arms (e.g., wheelchair or bedside chair)?: None Help needed to walk in hospital room?: None Help needed climbing 3-5 steps with a railing? : A Little 6 Click Score: 23    End of Session Equipment Utilized During Treatment: Oxygen Activity Tolerance: Patient tolerated treatment well Patient left: in bed;with call bell/phone within reach Nurse Communication: Mobility status PT Visit Diagnosis: Other (comment);Muscle weakness (generalized) (M62.81)     Time: 2778-2423 PT Time Calculation (min) (ACUTE ONLY): 10 min  Charges:  $Gait Training: 8-22 mins                     Sean Lawson, PT  Office # [phone] Pager [phone]    Ilda Foil 12/17/2019, 9:06 AM

## 2019-12-17 NOTE — Progress Notes (Addendum)
ANTICOAGULATION CONSULT NOTE  Pharmacy Consult for heparin bridge to warfarin Indication: pulmonary embolus 7/23  No Known Allergies  Patient Measurements: Height: 6\' 3"  (190.5 cm) Weight: (!) 198.5 kg (437 lb 11.2 oz) IBW/kg (Calculated) : 84.5 Heparin Dosing Weight: 135kg  Vital Signs: Temp: 97.8 F (36.6 C) (07/27 0447) Temp Source: Oral (07/27 0447) BP: 126/82 (07/27 0830) Pulse Rate: 87 (07/27 0830)  Labs: Recent Labs    12/15/19 0445 12/15/19 0445 12/16/19 0332 12/17/19 0401  HGB 13.0   < > 13.3 12.7*  HCT 41.9  --  43.3 41.3  PLT 298  --  327 309  LABPROT 14.4  --  17.7* 21.8*  INR 1.2  --  1.5* 2.0*  HEPARINUNFRC 0.55  --  0.58 0.60  CREATININE 1.00  --  1.04 0.94   < > = values in this interval not displayed.    Estimated Creatinine Clearance: 201.8 mL/min (by C-G formula based on SCr of 0.94 mg/dL).   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 Pharmacy is consulted for heparin and warfarin dosing. Patient is not on anticoagulation PTA.   This is overlap day 4 for heparin/warfarin bridge. Increased rate slightly on 7/24 to target higher end of heparin goal given extensive PE. HL is in goal range of 0.5-0.7 today. CBC wnl, hemodynamically stable.   Initial plan was to transition to apixaban for PE but after discussion with MD, decided to switch to warfarin given BMI 54 and insufficient data to support apixaban use for this indication in this weight range.   Patient received warfarin 15mg  x2 and 10mg  x1 with steady INR increase.  INR today on the lower end of goal, anticipate increase over the next few days.  Patient adamant about discharge today.  Will continue bridge with Lovenox this evening before discharge.  Follow up appointment  scheduled with Dr. 8/24 Friday morning for INR check.  Will send patient home with warfarin 5mg  tablets with instructions to take 1 and 1/2 tablets for total of 7.5mg .   Goal of Therapy:  Heparin level 0.3-0.7 units/ml (target higher end as able) Monitor platelets by anticoagulation protocol: Yes  INR 2-3   Plan:  Continue heparin infusion at 2650 units/hr until discharge Prior to discharge, will give one time dose of Lovenox 1mg /kg to carry into day 5 of overlap Warfarin 7.5mg  x1 this evening  Follow up appointment Friday morning    Renne Crigler, PharmD PGY-1 Acute Care Pharmacy Resident 12/17/2019 12:21 PM  Please check [email or web address] for unit-specific pharmacy phone numbers.

## 2019-12-23 ENCOUNTER — Other Ambulatory Visit: Payer: Self-pay

## 2019-12-23 ENCOUNTER — Ambulatory Visit (HOSPITAL_COMMUNITY)
Admit: 2019-12-23 | Discharge: 2019-12-23 | Disposition: A | Discharge status: Home / Self Care | Payer: 59 | Attending: Cardiology | Admitting: Cardiology

## 2019-12-23 ENCOUNTER — Encounter (HOSPITAL_COMMUNITY): Payer: Self-pay

## 2019-12-23 VITALS — BP 98/64 | HR 105 | Wt >= 6400 oz

## 2019-12-23 DIAGNOSIS — E669 Obesity, unspecified: Secondary | ICD-10-CM | POA: Insufficient documentation

## 2019-12-23 DIAGNOSIS — Z79899 Other long term (current) drug therapy: Secondary | ICD-10-CM | POA: Diagnosis not present

## 2019-12-23 DIAGNOSIS — Z7901 Long term (current) use of anticoagulants: Secondary | ICD-10-CM | POA: Diagnosis not present

## 2019-12-23 DIAGNOSIS — I110 Hypertensive heart disease with heart failure: Secondary | ICD-10-CM | POA: Diagnosis not present

## 2019-12-23 DIAGNOSIS — R000 Tachycardia, unspecified: Secondary | ICD-10-CM | POA: Diagnosis not present

## 2019-12-23 DIAGNOSIS — F1010 Alcohol abuse, uncomplicated: Secondary | ICD-10-CM | POA: Insufficient documentation

## 2019-12-23 DIAGNOSIS — I2699 Other pulmonary embolism without acute cor pulmonale: Secondary | ICD-10-CM | POA: Diagnosis not present

## 2019-12-23 DIAGNOSIS — Z6841 Body Mass Index (BMI) 40.0 and over, adult: Secondary | ICD-10-CM | POA: Diagnosis not present

## 2019-12-23 DIAGNOSIS — I5022 Chronic systolic (congestive) heart failure: Secondary | ICD-10-CM | POA: Diagnosis present

## 2019-12-23 DIAGNOSIS — Z8249 Family history of ischemic heart disease and other diseases of the circulatory system: Secondary | ICD-10-CM | POA: Insufficient documentation

## 2019-12-23 DIAGNOSIS — E119 Type 2 diabetes mellitus without complications: Secondary | ICD-10-CM | POA: Diagnosis not present

## 2019-12-23 DIAGNOSIS — Z7984 Long term (current) use of oral hypoglycemic drugs: Secondary | ICD-10-CM | POA: Diagnosis not present

## 2019-12-23 LAB — DIGOXIN LEVEL: Digoxin Level: 0.6 ng/mL — ABNORMAL LOW (ref 0.8–2.0)

## 2019-12-23 NOTE — Patient Instructions (Addendum)
Labs done today. We will contact you only if your labs are abnormal.  STOP Lasix  No other medication changes were made. Please continue all other medications as prescribed.  Your physician recommends that you schedule a follow-up appointment in: 3-4 weeks with our Clinic Pharmacist for medication titration and in 6 weeks with Dr. Shirlee Latch.  If you have any questions or concerns before your next appointment please send [place] a message through [place] or call our office at [phone].    TO LEAVE A MESSAGE FOR THE NURSE SELECT OPTION 2, PLEASE LEAVE A MESSAGE INCLUDING: . YOUR NAME . DATE OF BIRTH . CALL BACK NUMBER . REASON FOR CALL**this is important as we prioritize the call backs  YOU WILL RECEIVE A CALL BACK THE SAME DAY AS LONG AS YOU CALL BEFORE 4:00 PM  Your provider has recommended that you have a home sleep study.  [employer] is the company that does these test.  They will contact you by phone and must speak with you before they can ship the equipment.  Once they have spoken with you they will send the equipment right to your home with instructions on how to set it up.  Once you have completed the test you just dispose of the equipment, the information is automatically uploaded to [place] via [employer] technology.  IF you have any questions or issues with the equipment please call the company directly at [phone].  If your test is positive for sleep apnea and you need a home CPAP machine you will be contacted by Dr Norris Cross office [facility]) to set this up.    Do the following things EVERYDAY: 1) Weigh yourself in the morning before breakfast. Write it down and keep it in a log. 2) Take your medicines as prescribed 3) Eat low salt foods--Limit salt (sodium) to 2000 mg per day.  4) Stay as active as you can everyday 5) Limit all fluids for the day to less than 2 liters    At the [facility], you and your health needs are our priority. As part of our  continuing mission to provide you with exceptional heart care, we have created designated Provider Care Teams. These Care Teams include your primary Cardiologist (physician) and Advanced Practice Providers (APPs- Physician Assistants and Nurse Practitioners) who all work together to provide you with the care you need, when you need it.   You may see any of the following providers on your designated Care Team at your next follow up: Marland Kitchen Dr Arvilla Meres . Dr Marca Ancona . Tonye Becket, NP . Robbie Lis, PA . Karle Plumber, PharmD   Please be sure to bring in all your medications bottles to every appointment.

## 2019-12-23 NOTE — Progress Notes (Signed)
Advanced Heart Failure Clinic Note   Referring Physician: PCP: Sean Brunette, MD PCP-Cardiologist: Sean Schultz, MD  [facility]: Sean Lawson   HPI:  [name] is a 34 year old with a history of HTN, obesity, ETOH abuse, newly diagnosed systolic heart failure, PE and diabetes, presenting to clinic for post hospital f/u.   Recently admitted early 7/21 with increased dyspnea and leg edema. ECHO showed reduced EF 20-25%. Diuresed with IV lasix and transitioning to po lasix 80 mg daily. Overall diuresed 35 pounds. Had LHC/RHC that showed normal coronaries and elevated filling pressures. Also new diagnosis of T2DM, Hgb A1c 11.4. Now on metformin + dapagliflozin. He was discharged home 12/03/19 on GDMT w/ plans to repeat 2D echo in 3 months.   Shortly after discharge, he was readmitted on 12/12/19 by IM for acute bilateral PE. Was referred to ED by PCP for dyspnea and hypoxia. D-dimer elevated at 6.61. EKG showed sinus tachycardia. CT angiogram of the chest confirmed bilateral pulmonary embolism with mild right heart strain. Repeat limited echo showed RV to be mildly enlarged but systolic function normal. LVEF was 30-35%, mildly improved from previous study. BLE Dopplers negative for DVT. Given BMI of 35, not felt to be ideal candidate for DOAC. He was placed on coumadin w/ heparin bridge.   He now presents to [facility] for f/u for systolic heart failure. He is here w/ his mom.  He complains of feeling tired and fatigue. Also gets dizzy upon standing but quickly fades. Denies syncope/ near syncope. EKG show sinus tach, 107 bpm. BP 98/64. He has lost ~46 lb since his first discharge. He is on daily Lasix, Sean Lawson and dapagliflozin. He denies dyspnea. He has been fully compliant w/ meds. INRs are being followed by his PCP, Sean Lawson. Was therapeutic last week at 2.6. Denies abnormal bleeding. He has quit drinking ETOH.     Cardiac Studies  2D Echo 11/28/19  1. Left ventricular ejection fraction, by  estimation, is 20 to 25%. The left ventricle has severely decreased function. Left ventricular endocardial border not optimally defined to evaluate regional wall motion. The left ventricular internal cavity size was mildly dilated. Left ventricular diastolic parameters are indeterminate. 2. Right ventricular systolic function was not well visualized. The right ventricular size is not well visualized. Tricuspid regurgitation signal is inadequate for assessing PA pressure. 3. The mitral valve was not well visualized. No evidence of mitral valve regurgitation. No evidence of mitral stenosis. 4. The aortic valve is tricuspid. Aortic valve regurgitation is not visualized. No aortic stenosis is present. 5. Aortic dilatation noted. There is mild dilatation of the aortic root measuring 38 mm. 6. The inferior vena cava is normal in size with greater than 50% respiratory variability, suggesting right atrial pressure of 3 mmHg. 7. Very technically difficult study with poor images.   [facility] 12/02/19  Procedures  RIGHT/LEFT HEART CATH AND CORONARY ANGIOGRAPHY  Conclusion  1. Mildly elevated left and right heart filling pressures with pulmonary venous hypertension.  2. Low cardiac output (CI 2.0).  3. No significant coronary disease, nonischemic cardiomyopathy.     Limited Echo 12/13/19  1. Left ventricular ejection fraction, by estimation, is 30 to 35%. The  left ventricle has moderately decreased function. The left ventricle  demonstrates global hypokinesis. The left ventricular internal cavity size  was moderately dilated.  2. Right ventricular systolic function is normal. The right ventricular  size is mildly enlarged.  3. The mitral valve is grossly normal. Trivial mitral valve  regurgitation.  4. The aortic valve was not well visualized. Aortic valve regurgitation  is not visualized. No aortic stenosis is present.  5. The inferior vena cava is normal in size with greater than 50%    respiratory variability, suggesting right atrial pressure of 3 mmHg.   Comparison(s): Changes from prior study are noted. EF appears mildly  improved.   Review of Systems: [y] = yes, [ ]  = no   General: Weight gain [ ] ; Weight loss [ ] ; Anorexia [ ] ; Fatigue [Y]; Fever [ ] ; Chills [ ] ; Weakness [ Y]  Cardiac: Chest pain/pressure [ ] ; Resting SOB [ ] ; Exertional SOB [ ] ; Orthopnea [ ] ; Pedal Edema [ ] ; Palpitations [ ] ; Syncope [ ] ; Presyncope [ ] ; Paroxysmal nocturnal dyspnea[ ]   Pulmonary: Cough [ ] ; Wheezing[ ] ; Hemoptysis[ ] ; Sputum [ ] ; Snoring [ ]   GI: Vomiting[ ] ; Dysphagia[ ] ; Melena[ ] ; Hematochezia [ ] ; Heartburn[ ] ; Abdominal pain [ ] ; Constipation [ ] ; Diarrhea [ ] ; BRBPR [ ]   GU: Hematuria[ ] ; Dysuria [ ] ; Nocturia[ ]   Vascular: Pain in legs with walking [ ] ; Pain in feet with lying flat [ ] ; Non-healing sores [ ] ; Stroke [ ] ; TIA [ ] ; Slurred speech [ ] ;  Neuro: Headaches[ ] ; Vertigo[ ] ; Seizures[ ] ; Paresthesias[ ] ;Blurred vision [ ] ; Diplopia [ ] ; Vision changes [ ]   Ortho/Skin: Arthritis [ ] ; Joint pain [ ] ; Muscle pain [ ] ; Joint swelling [ ] ; Back Pain [ ] ; Rash [ ]   Psych: Depression[ ] ; Anxiety[ ]   Heme: Bleeding problems [ ] ; Clotting disorders [ ] ; Anemia [ ]   Endocrine: Diabetes [ ] ; Thyroid dysfunction[ ]    Past Medical History:  Diagnosis Date  . Chronic systolic CHF (congestive heart failure) (HCC) 12/13/2019  . Diabetes mellitus without complication (HCC)   . Hypertension     Current Outpatient Medications  Medication Sig Dispense Refill  . benzonatate (TESSALON) 200 MG capsule Take 1 capsule (200 mg total) by mouth 2 (two) times daily as needed for cough. 20 capsule 0  . carvedilol (COREG) 3.125 MG tablet Take 1 tablet (3.125 mg total) by mouth 2 (two) times daily with a meal. 60 tablet 6  . Dapagliflozin-metFORMIN HCl ER (XIGDUO XR) 02-999 MG TB24 Take 1 tablet by mouth daily.    . digoxin (LANOXIN) 0.125 MG tablet Take 1 tablet (0.125 mg total) by mouth  daily. 30 tablet 6  . guaiFENesin (MUCINEX) 600 MG 12 hr tablet Take 1 tablet (600 mg total) by mouth 2 (two) times daily as needed for up to 7 days. 14 tablet 0  . magnesium oxide (MAG-OX) 400 (241.3 Mg) MG tablet Take 1 tablet (400 mg total) by mouth daily. 30 tablet 6  . pantoprazole (PROTONIX) 40 MG tablet Take 1 tablet (40 mg total) by mouth daily. 30 tablet 0  . sacubitril-valsartan (ENTRESTO) 49-51 MG Take 1 tablet by mouth 2 (two) times daily. 60 tablet 6  . spironolactone (ALDACTONE) 25 MG tablet Take 1 tablet (25 mg total) by mouth daily. 30 tablet 6  . traMADol (ULTRAM) 50 MG tablet Take by mouth every 6 (six) hours as needed for moderate pain or severe pain.    Marland Kitchen warfarin (COUMADIN) 5 MG tablet Take 1.5 tablets (7.5 mg total) by mouth daily at 4 PM. 45 tablet 0   No current facility-administered medications for this encounter.    No Known Allergies    Social History   Socioeconomic History  . Marital status: Single  Spouse name: Not on file  . Number of children: 0  . Years of education: Not on file  . Highest education level: Master's degree (e.g., MA, MS, MEng, MEd, MSW, MBA)  Occupational History  . 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 Smoking status: Never Smoker  . Smokeless tobacco: Former Neurosurgeon    Types: Snuff  Vaping Use  . Vaping Use: Never used  Substance and Sexual Activity  . Alcohol use: Yes    Alcohol/week: 30.0 standard drinks    Types: 30 Shots of liquor per week    Comment: weekly  . Drug use: Never  . Sexual activity: Not Currently    Birth control/protection: None  Other Topics Concern  . Not on file  Social History Narrative  . Not on file   Social Determinants of Health   Financial Resource Strain:   . Difficulty of Paying Living Expenses:   Food Insecurity:   . Worried About Programme researcher, broadcasting/film/video in the Last Year:   . Barista in the Last Year:   Transportation Needs:   . Freight forwarder (Medical):   Marland Kitchen Lack of  Transportation (Non-Medical):   Physical Activity:   . Days of Exercise per Week:   . Minutes of Exercise per Session:   Stress:   . Feeling of Stress :   Social Connections:   . Frequency of Communication with Friends and Family:   . Frequency of Social Gatherings with Friends and Family:   . Attends Religious Services:   . Active Member of Clubs or Organizations:   . Attends Banker Meetings:   Marland Kitchen Marital Status:   Intimate Partner Violence:   . Fear of Current or Ex-Partner:   . Emotionally Abused:   Marland Kitchen Physically Abused:   . Sexually Abused:       Family History  Problem Relation Age of Onset  . Diabetes Mellitus II Mother   . Heart failure Maternal Grandmother   . Heart disease Maternal Grandfather     Vitals:   12/23/19 0958  BP: (!) 98/64  Pulse: 105  SpO2: 95%  Weight: (!) 194 kg (427 lb 12.8 oz)     PHYSICAL EXAM: General:  Well appearing, morbidly obese young WM. No respiratory difficulty HEENT: normal Neck: supple. Thick neck, no JVD. Carotids 2+ bilat; no bruits. No lymphadenopathy or thyromegaly appreciated. Cor: PMI nondisplaced. Regular rhythm, mildly tachy rate. No rubs, gallops or murmurs. Lungs: clear Abdomen: obese but soft, nontender, nondistended. No hepatosplenomegaly. No bruits or masses. Good bowel sounds. Extremities: no cyanosis, clubbing, rash, edema Neuro: alert & oriented x 3, cranial nerves grossly intact. moves all 4 extremities w/o difficulty. Affect pleasant.  ECG: Sinus tachycardia 107 bpm    ASSESSMENT & PLAN:   1. Chronic Systolic CHF: Echo 11/28/19 was difficult but EF appeared to be 20-25%. Cause is uncertain. However, heavy ETOH may play a role as well as HTN and untreated diabetes. Cannot rule out viral myocarditis. LHC showed no coronary disease. RHC w/ normal CO. Repeat Limited Echo 12/12/19 showed slight improvement in LVEF, 30-35%. RV systolic function normal - NYHA Class II-III, confounded by obesity and newly  diagnosed PE (now on a/c) - Suspect he is overdiuresed (orthostatic symptoms + 46 lb wt loss since discharge). BP 98/64. BP too soft for med titration today  - Stop Lasix. Change to PRN.  - Continue digoxin 0.125 daily. Check Dig level today  - Continue spironolactone 25 mg daily.  -  Continue Coreg 3.125 bid.  - Continue Entresto 49/51 bid. - f/u w/ pharmD in 2 weeks to reassess BP. Further titrate meds if BP permits.  - suspect persistent tachycardia may be 2/2 overdiuresis. Stop Lasix per above. If still tachycardic at next f/u, consider titration of Coreg if BP permits vs initiation of Corlanor.   - Plan to repeat ECHO 3 months after HF meds fully optimized.  - If EF remains <35%, will need referral to EP for ICD. ECG is narrow complex so not CRT candidate 2. Bilateral PE: diagnosed 12/13/19. Bilateral LE venous dopplers negative for PE. Limited echo w/ mildly enlarged RV but normal systolic function  - started on coumadin (not candidate for DOAC w/ high BMI). Bridged w/ heparin prior to d/c from hospital - INRs followed by PCP. Denies abnormal bleeding but check CBC today given soft BP/ orthostatic symptoms 3. Type 2 diabetes: New diagnosis with hgbA1c 11.  - on Dapagliglozin- Metformin - followed by PCP   3. Suspect OSA: set up for home sleep study  4. ETOH abuse: Former heavy drinker. He reports he has quit 5. Obesity Body mass index is 53.47 kg/m. - discussed walking for exercise/wt loss + low carb diet   F/u w/ pharmD in 2-3 weeks for med titration, APP f/u in 4-6 weeks    Robbie Lis, PA-C 12/23/19

## 2019-12-24 ENCOUNTER — Encounter (INDEPENDENT_AMBULATORY_CARE_PROVIDER_SITE_OTHER): Payer: 59 | Admitting: Cardiology

## 2019-12-24 DIAGNOSIS — G4733 Obstructive sleep apnea (adult) (pediatric): Secondary | ICD-10-CM

## 2019-12-27 ENCOUNTER — Telehealth (HOSPITAL_COMMUNITY): Payer: Self-pay | Admitting: *Deleted

## 2019-12-27 ENCOUNTER — Encounter (HOSPITAL_COMMUNITY): Payer: Self-pay | Admitting: *Deleted

## 2019-12-27 ENCOUNTER — Telehealth (HOSPITAL_COMMUNITY): Payer: Self-pay

## 2019-12-27 NOTE — Telephone Encounter (Signed)
Patient advised and verbalized understanding 

## 2019-12-27 NOTE — Telephone Encounter (Signed)
Please get vaccination ASAP.  I do not have sleep study results yet.

## 2019-12-27 NOTE — Telephone Encounter (Signed)
Patients FMLA forms signed and faxed successfully to [phone]. Pt picked up originals

## 2019-12-27 NOTE — Progress Notes (Signed)
Received pt's [employer] disability forms, they have been completed and placed for Dr Shirlee Latch to review and sign

## 2019-12-27 NOTE — Telephone Encounter (Signed)
pts mother left VM stating Dr.McLean told pt to hold off on the covid vaccine during his hospital admission. 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  Pt also requests sleep study results.   Routed to Dr.McLean for advice

## 2020-01-04 ENCOUNTER — Ambulatory Visit: Payer: 59

## 2020-01-04 NOTE — Procedures (Signed)
   Sleep Study Report  Patient Information  Name: Sean Lawson  ID: [id number] Birth Date: Jan 20, 1986  Age: 34  Gender: Male Study Date: 08-13-84 Referring Physician:  TEST DESCRIPTION: Home sleep apnea testing was completed using the WatchPat, a Type 1 device, utilizing peripheral arterial tonometry (PAT), chest movement, actigraphy, pulse oximetry, pulse rate, body position and snore. AHI was calculated with apnea and hypopnea using valid sleep time as the denominator. RDI includes apneas, hypopneas, and RERAs. The data acquired and the scoring of sleep and all associated events were performed in  accordance with the recommended standards and specifications as outlined in the AASM Manual for the Scoring of Sleep and Associated Events 2.2.0 (2015).  FINDINGS: 1. Severe Obstructive Sleep Apnea with AHI 78.1/hr.  2. No Central Sleep Apnea with pAHIc 0/hr. 3. Oxygen desaturations as low as 74%. 4. Moderate snoring was present. O2 sats were < 88% for 283.64min. 5. Total sleep time was 7 hrs and 9 min. 6. 16.8*% of total sleep time was spent in REM sleep.  7. Shortened sleep onset latency at 8 min.  8. Normal REM sleep onset latency at 76 min.  9. Total awakenings were 41.   DIAGNOSIS:  Severe Obstructive Sleep Apnea (G47.33)  RECOMMENDATIONS: 1. Clinical correlation of these findings is necessary. The decision to treat obstructive sleep apnea (OSA) is usually based on the presence of apnea symptoms or the presence of associated medical conditions such as Hypertension, Congestive Heart Failure, Atrial Fibrillation or Obesity. The most common symptoms of OSA are snoring, gasping for breath while sleeping, daytime sleepiness and fatigue.   2. Initiating apnea therapy is recommended given the presence of symptoms and/or associated conditions.   Recommend proceeding with one of the following:   a. Auto-CPAP therapy with a pressure range of 5-20cm H2O.   b. An oral appliance (OA) that  can be obtained from certain dentists with expertise in sleep medicine. These are primarily of use in non-obese patients with mild and moderate disease.   c. An ENT consultation which may be useful to look for specific causes of obstruction and possible treatment options.   d. If patient is intolerant to PAP therapy, consider referral to ENT for evaluation for hypoglossal nerve stimulator.   3. Close follow-up is necessary to ensure success with CPAP or oral appliance therapy for maximum benefit .  4. A follow-up oximetry study on CPAP is recommended to assess the adequacy of therapy and determine the need for supplemental oxygen or the potential need for Bi-level therapy. An arterial blood gas to determine the adequacy of baseline ventilation and oxygenation should also be considered.  5. Healthy sleep recommendations include: adequate nightly sleep (normal 7-9 hrs/night), avoidance of caffeine after noon and alcohol near bedtime, and maintaining a sleep environment that is cool, dark and quiet.  6. Weight loss for overweight patients is recommended. Even modest amounts of weight loss can significantly improve the severity of sleep apnea.  7. Snoring recommendations include: weight loss where appropriate, side sleeping, and avoidance of alcohol before bed.  8. Operation of motor vehicle or dangerous equipment must be avoided when feeling drowsy, excessively sleepy, or mentally fatigued.  Report prepared by:  Signature: Armanda Magic , MD, [facility], Diplomate ABSM Electronically Signed: Jan 04, 2020

## 2020-01-07 ENCOUNTER — Telehealth: Payer: Self-pay | Admitting: *Deleted

## 2020-01-07 DIAGNOSIS — G4733 Obstructive sleep apnea (adult) (pediatric): Secondary | ICD-10-CM

## 2020-01-07 NOTE — Telephone Encounter (Signed)
Informed patient of sleep study results and patient understanding was verbalized. Patient understands his sleep study showed they have sleep apnea and recommend CPAP titration. Please set up titration in the sleep lab.   Pt is aware and agreeable to his results. Titration sent to sleep lab 

## 2020-01-07 NOTE — Telephone Encounter (Signed)
-----   Message from Quintella Reichert, MD sent at 01/04/2020 12:20 PM EDT ----- Please let patient know that they have sleep apnea and recommend CPAP titration. Please set up titration in the sleep lab.

## 2020-01-08 ENCOUNTER — Telehealth (HOSPITAL_COMMUNITY): Payer: Self-pay

## 2020-01-08 NOTE — Telephone Encounter (Signed)
Received group critical illness claim form for patient,form filled out, signed & successfully faxed to [phone]. Forms will be scanned into patients chart. lmtrc to see if patient wanted [place] to mail the originals or hold them until he comes into his pharmacy appt.

## 2020-01-20 ENCOUNTER — Other Ambulatory Visit: Payer: Self-pay

## 2020-01-20 ENCOUNTER — Encounter (HOSPITAL_COMMUNITY): Payer: Self-pay

## 2020-01-20 ENCOUNTER — Ambulatory Visit (HOSPITAL_COMMUNITY)
Admission: RE | Admit: 2020-01-20 | Discharge: 2020-01-20 | Disposition: A | Discharge status: Home / Self Care | Payer: 59 | Source: Ambulatory Visit | Attending: Internal Medicine | Admitting: Internal Medicine

## 2020-01-20 VITALS — BP 118/78 | HR 98 | Wt >= 6400 oz

## 2020-01-20 DIAGNOSIS — I2699 Other pulmonary embolism without acute cor pulmonale: Secondary | ICD-10-CM | POA: Diagnosis not present

## 2020-01-20 DIAGNOSIS — F1010 Alcohol abuse, uncomplicated: Secondary | ICD-10-CM | POA: Insufficient documentation

## 2020-01-20 DIAGNOSIS — I5022 Chronic systolic (congestive) heart failure: Secondary | ICD-10-CM

## 2020-01-20 DIAGNOSIS — E669 Obesity, unspecified: Secondary | ICD-10-CM | POA: Diagnosis not present

## 2020-01-20 DIAGNOSIS — Z7901 Long term (current) use of anticoagulants: Secondary | ICD-10-CM | POA: Insufficient documentation

## 2020-01-20 DIAGNOSIS — E119 Type 2 diabetes mellitus without complications: Secondary | ICD-10-CM | POA: Diagnosis not present

## 2020-01-20 DIAGNOSIS — I110 Hypertensive heart disease with heart failure: Secondary | ICD-10-CM | POA: Insufficient documentation

## 2020-01-20 DIAGNOSIS — Z6841 Body Mass Index (BMI) 40.0 and over, adult: Secondary | ICD-10-CM | POA: Diagnosis not present

## 2020-01-20 MED ORDER — SACUBITRIL-VALSARTAN 97-103 MG PO TABS: 1.0000 | ORAL_TABLET | Freq: Two times a day (BID) | ORAL | 3 refills | Status: DC

## 2020-01-20 NOTE — Progress Notes (Signed)
PCP: [name], MD PCP-Cardiologist: Donato Schultz, MD  [facility]: Dr. Shirlee Latch   HPI:  Mr Braman is a 34 year old with a history of HTN, obesity, ETOH abuse, newly diagnosed systolic heart failure, PE and diabetes.  Recently admitted early 11/2019 with increased dyspnea and leg edema. ECHO showed reduced EF 20-25%. Diuresed with IV lasix and transitioned to PO lasix 80 mg daily. Overall diuresed 35 pounds. Had LHC/RHC that showed normal coronaries and elevated filling pressures. Also new diagnosis of T2DM, Hgb A1c 11.4. Now on metformin + dapagliflozin. He was discharged home 12/03/19 on GDMT w/ plans to repeat 2D echo in 3 months.   Shortly after discharge, he was readmitted on 12/12/19 by IM for acute bilateral PE. Was referred to ED by PCP for dyspnea and hypoxia. D-dimer elevated at 6.61. EKG showed sinus tachycardia. CT angiogram of the chest confirmed bilateral pulmonary embolism with mild right heart strain. Repeat limited ECHO showed RV to be mildly enlarged but systolic function normal. LVEF was 30-35%, mildly improved from previous study. BLE Dopplers negative for DVT. Given BMI of 35, not felt to be ideal candidate for DOAC. He was placed on warfarin with heparin bridge.   Presented to Advanced HF clinic with his mother on 12/23/19 for follow-up for systolic heart failure. He complained of feeling tired and fatigued. Also would get dizzy upon standing but noted that would quickly fade. Denied syncope/ near syncope. 12/23/19 EKG showed sinus tach, 107 bpm. BP 98/64. He had lost ~46 lb since his first discharge. He was on daily furosemide, Entresto, spironolactone and dapagliflozin. He denied dyspnea. He was fully compliant with meds. INRs followed by his PCP, Dr. Renne Crigler. Was therapeutic 2.6. Denied abnormal bleeding. He had quit drinking ETOH.   Today he returns to HF clinic for pharmacist medication titration. At last visit with PA-C, furosemide 80 mg was changed to PRN due to suspected over  diuresis (orthostatic symptoms and 46 lbs weight loss since discharge). Overall, he's feeling much improved today in clinic. Denies fatigue, lightheadedness, but has occasional, brief orthostasis lasting only a few seconds. Denies chest pain or palpitations. 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 Home weights were stable, ranging from 415-420 lbs. Reports increased water intake during long, hot football practices and has started taking furosemide 80 mg PRN to minimize fluid gain during those times (~3-4x week). Denies LEE, PND, orthopnea. Endorses adherence to low salt diet. Doesn't report problems obtaining or affording medications and is tolerating current regimen.    HF Medications: Carvedilol 3.125 mg BID Entresto 49-51 mg BID Spironolactone 25 mg daily Digoxin 0.125 mg daily Dapagliflozin 10 mg daily (as component of Dapagliflozin-metformin 09-998 mg BID)  Has the patient been experiencing any side effects to the medications prescribed?  no  Does the patient have any problems obtaining medications due to transportation or finances?   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 Patient understands to avoid decongestants.    Pertinent Lab Values: 12/17/2019 . Serum creatinine 0.94, BUN 20, Potassium 4.4, Sodium 131, BNP 441.1 pg/mL (11/27/19), Magnesium 1.5 (12/03/19), Digoxin 0.6 ng/mL (12/23/19)   Vital Signs: . Weight: 425.6 lbs (last clinic weight: 427 lbs) . Blood pressure: 118/78 mmHg . Heart rate: 98 bpm  Assessment: 1. Chronic Systolic CHF: Echo 11/28/19 was difficult but EF appeared to be 20-25%. Cause is uncertain. However, heavy ETOH may play a role as well  as HTN and untreated diabetes. Cannot rule out viral myocarditis. LHC showed no coronary disease. RHC w/ normal CO. Repeat Limited Echo 12/12/19 showed slight improvement in  LVEF, 30-35%. RV systolic function normal - NYHA Class II, euvolemic on exam today - Continue furosemide 80 mg PRN  - Continue carvedilol 3.125 mg BID. Consider increasing at next visit as tolerated.  - Increase Entresto to 97-103 mg BID. Repeat BMET in 2 weeks. - Continue spironolactone 25 mg daily.  - Continue dapagliflozin 10 mg daily (component of dapagliflozin-metformin 09-998 mg BID) - Continue digoxin 0.125 daily.   Level 0.6 ng/mL OK on 12/23/19  - Plan to repeat ECHO 3 months after HF meds fully optimized. - If EF remains <35%, will need referral to EP for ICD. ECG is narrow complex so not CRT candidate  2. Bilateral PE: diagnosed 12/13/19. Bilateral LE venous dopplers negative for PE. Limited echo w/ mildly enlarged RV but normal systolic function  - Started on warfarin (not candidate for DOAC w/ high BMI). Bridged with heparin prior to discharge from hospital - INRs followed by PCP.  3. Type 2 diabetes: Recent diagnosis with Hgb A1c 11.  - on Dapagliglozin- Metformin 09-998 mg BID - Followed by PCP    3. Suspect OSA: set up for home sleep study   4. ETOH abuse: Former heavy drinker. Reported quitting at last clinic visit, 12/23/19.   5. Obesity - Body mass index is 53.47 kg/m. - Walking for exercise/wt loss + low carb diet previously discussed   Plan: 1) Medication changes: Based on clinical presentation, vital signs and recent labs will increase Entresto to 97/103 mg BID. 2) BMET in 2 weeks, 02/06/2020. 3) Follow-up with Dr. Shirlee Latch 02/24/2020.   Karle Plumber, PharmD, BCPS, BCCP, CPP Heart Failure Clinic Pharmacist [phone]

## 2020-01-20 NOTE — Patient Instructions (Addendum)
It was a pleasure seeing you today!  MEDICATIONS: -We are changing your medications today -Increase Entresto 97/103 mg (1 tablet) twice daily. You may take 2 tablets of the 49/51 mg twice daily until you pick up the new strength.  -Call if you have questions about your medications.    NEXT APPOINTMENT: Return to clinic in 1 month with Dr. Shirlee Latch.  In general, to take care of your heart failure: -Limit your fluid intake to 2 Liters (half-gallon) per day.   -Limit your salt intake to ideally 2-3 grams (2000-3000 mg) per day. -Weigh yourself daily and record, and bring that "weight diary" to your next appointment.  (Weight gain of 2-3 pounds in 1 day typically means fluid weight.) -The medications for your heart are to help your heart and help you live longer.   -Please contact us before stopping any of your heart medications.  Call the clinic at [phone] with questions or to reschedule future appointments.

## 2020-01-21 ENCOUNTER — Telehealth: Payer: Self-pay

## 2020-01-21 NOTE — Telephone Encounter (Signed)
-----   Message from [name], CMA sent at 01/20/2020  2:20 PM EDT ----- Regarding: precert CPAP titration

## 2020-01-21 NOTE — Telephone Encounter (Addendum)
Received request for clinical information for review for CPAP Titration Office Note, Epworth Sleepiness Score, and Sleep Study results Faxed  (f) [phone]

## 2020-01-21 NOTE — Addendum Note (Signed)
Addended by: Rebbeca Paul C on: 01/21/2020 11:11 AM   Modules accepted: Orders

## 2020-01-21 NOTE — Telephone Encounter (Signed)
PA Initiated for CPAP Titration Case ID - [id number] Pending Auth - [id number]

## 2020-01-28 ENCOUNTER — Telehealth: Payer: Self-pay | Admitting: *Deleted

## 2020-01-28 NOTE — Telephone Encounter (Signed)
Patient is scheduled for CPAP Titration on 02/29/20. Patient understands his titration study will be done at [facility] sleep lab. Patient understands he will receive a letter in a week or so detailing appointment, date, time, and location. Patient understands to call if he does not receive the letter  in a timely manner.  Left detailed message on voicemail with date and time of titration and informed patient to call back to confirm or reschedule.  

## 2020-01-28 NOTE — Telephone Encounter (Signed)
Staff message sent to [name] ok to schedule CPAP titration. [employer] Auth received. Auth # [id number].

## 2020-01-28 NOTE — Telephone Encounter (Signed)
RE: precert [name], CMA  [name], CMA Ok to schedule. Auth received from [facility].

## 2020-02-03 ENCOUNTER — Telehealth (HOSPITAL_COMMUNITY): Payer: Self-pay | Admitting: Student-PharmD

## 2020-02-04 ENCOUNTER — Telehealth (HOSPITAL_COMMUNITY): Payer: Self-pay | Admitting: *Deleted

## 2020-02-04 ENCOUNTER — Encounter (HOSPITAL_COMMUNITY)
Admission: RE | Admit: 2020-02-04 | Discharge: 2020-02-04 | Disposition: A | Discharge status: Home / Self Care | Payer: 59 | Source: Ambulatory Visit | Attending: Cardiology | Admitting: Cardiology

## 2020-02-04 DIAGNOSIS — I5022 Chronic systolic (congestive) heart failure: Secondary | ICD-10-CM | POA: Insufficient documentation

## 2020-02-04 NOTE — Telephone Encounter (Signed)
Spoke with the patient. Completed health history. Confirmed orientation appointment for 02/06/20.Gladstone Lighter, RN,BSN 02/04/2020 1:36 PM

## 2020-02-04 NOTE — Telephone Encounter (Signed)
Cardiac Rehab Medication Review by a Pharmacist  Does the patient feel that his/her medications are working for him/her?  yes  Has the patient been experiencing any side effects to the medications prescribed?  no  Does the patient measure his/her own blood pressure or blood glucose at home?  Reports checking blood pressure once a day (this morning it was 115/69) and checking blood glucose twice a day (this morning it was 168).  Does the patient have any problems obtaining medications due to transportation or finances?   no  Understanding of regimen: good Understanding of indications: good Potential of compliance: good    Pharmacist Intervention: No medication interventions needed at this time.     Pervis Hocking, PharmD PGY1 Pharmacy Resident 02/04/2020 11:36 AM

## 2020-02-06 ENCOUNTER — Encounter (HOSPITAL_COMMUNITY)
Admission: RE | Admit: 2020-02-06 | Discharge: 2020-02-06 | Disposition: A | Discharge status: Home / Self Care | Payer: 59 | Source: Ambulatory Visit | Attending: Internal Medicine | Admitting: Internal Medicine

## 2020-02-06 ENCOUNTER — Encounter (HOSPITAL_COMMUNITY): Payer: Self-pay

## 2020-02-06 ENCOUNTER — Ambulatory Visit (HOSPITAL_COMMUNITY)
Admission: RE | Admit: 2020-02-06 | Discharge: 2020-02-06 | Disposition: A | Discharge status: Home / Self Care | Payer: 59 | Source: Ambulatory Visit | Attending: Internal Medicine | Admitting: Internal Medicine

## 2020-02-06 ENCOUNTER — Other Ambulatory Visit: Payer: Self-pay

## 2020-02-06 VITALS — BP 108/70 | HR 87 | Ht 74.25 in | Wt >= 6400 oz

## 2020-02-06 DIAGNOSIS — I5022 Chronic systolic (congestive) heart failure: Secondary | ICD-10-CM | POA: Diagnosis present

## 2020-02-06 LAB — BASIC METABOLIC PANEL
Anion gap: 12 (ref 5–15)
BUN: 21 mg/dL — ABNORMAL HIGH (ref 6–20)
CO2: 25 mmol/L (ref 22–32)
Calcium: 9.3 mg/dL (ref 8.9–10.3)
Chloride: 97 mmol/L — ABNORMAL LOW (ref 98–111)
Creatinine, Ser: 1.1 mg/dL (ref 0.61–1.24)
GFR calc Af Amer: >60 mL/min (ref >60)
GFR calc non Af Amer: >60 mL/min (ref >60)
Glucose, Bld: 189 mg/dL — ABNORMAL HIGH (ref 70–99)
Potassium: 4.1 mmol/L (ref 3.5–5.1)
Sodium: 134 mmol/L — ABNORMAL LOW (ref 135–145)

## 2020-02-06 NOTE — Progress Notes (Signed)
Cardiac Individual Treatment Plan  Patient Details  Name: Sean Lawson MRN: [id number] Date of Birth: [birth year] Referring Provider:     CARDIAC REHAB PHASE II ORIENTATION from 02/06/2020 in [facility] CARDIAC REHAB  Referring Provider [name]      Initial Encounter Date:    CARDIAC REHAB PHASE II ORIENTATION from 02/06/2020 in [facility] CARDIAC REHAB  Date 02/06/20      Visit Diagnosis: Chronic systolic CHF (congestive heart failure) (HCC)  Patient's Home Medications on Admission:  Current Outpatient Medications:  .  augmented betamethasone dipropionate (DIPROLENE-AF) 0.05 % cream, Apply 1 application topically daily as needed for rash., Disp: , Rfl:  .  carvedilol (COREG) 3.125 MG tablet, Take 1 tablet (3.125 mg total) by mouth 2 (two) times daily with a meal., Disp: 60 tablet, Rfl: 6 .  Dapagliflozin-metFORMIN HCl ER (XIGDUO XR) 09-998 MG TB24, Take 1 tablet by mouth 2 (two) times daily. , Disp: , Rfl:  .  digoxin (LANOXIN) 0.125 MG tablet, Take 1 tablet (0.125 mg total) by mouth daily., Disp: 30 tablet, Rfl: 6 .  furosemide (LASIX) 80 MG tablet, Take 80 mg by mouth daily as needed for fluid or edema., Disp: , Rfl:  .  magnesium oxide (MAG-OX) 400 (241.3 Mg) MG tablet, Take 1 tablet (400 mg total) by mouth daily., Disp: 30 tablet, Rfl: 6 .  ONETOUCH VERIO test strip, 1 each 2 (two) times daily. Use as directed to check blood sugar twice daily., Disp: , Rfl:  .  OZEMPIC, 0.25 OR 0.5 MG/DOSE, 2 MG/1.5ML SOPN, Inject 0.25 mg into the skin once a week., Disp: , Rfl:  .  pantoprazole (PROTONIX) 40 MG tablet, Take 1 tablet (40 mg total) by mouth daily., Disp: 30 tablet, Rfl: 0 .  sacubitril-valsartan (ENTRESTO) 97-103 MG, Take 1 tablet by mouth 2 (two) times daily., Disp: 180 tablet, Rfl: 3 .  spironolactone (ALDACTONE) 25 MG tablet, Take 1 tablet (25 mg total) by mouth daily., Disp: 30 tablet, Rfl: 6 .  warfarin (COUMADIN) 5 MG tablet, Take  1.5 tablets (7.5 mg total) by mouth daily at 4 PM., Disp: 45 tablet, Rfl: 0  Past Medical History: Past Medical History:  Diagnosis Date  . Chronic systolic CHF (congestive heart failure) (HCC) 12/13/2019  . Diabetes mellitus without complication (HCC)   . Hypertension     Tobacco Use: Social History   Tobacco Use  Smoking Status Never Smoker  Smokeless Tobacco Former Neurosurgeon  . Types: Snuff    Labs: Recent Review Flowsheet Data    Labs for ITP Cardiac and Pulmonary Rehab Latest Ref Rng & Units 11/30/2019 12/01/2019 12/02/2019 12/02/2019 12/02/2019   Cholestrol 0 - 200 mg/dL - - - - -   LDLCALC 0 - 99 mg/dL - - - - -   HDL >47 mg/dL - - - - -   Trlycerides <150 mg/dL - - - - -   Hemoglobin A1c 4.8 - 5.6 % - - - - -   HCO3 20.0 - 28.0 mmol/L - - - 42.0(H) 42.3(H)   TCO2 22 - 32 mmol/L - - - 44(H) 44(H)   O2SAT % 75.2 78.2 64.7 60.0 56.0      Capillary Blood Glucose: Lab Results  Component Value Date   GLUCAP 223 (H) 12/17/2019   GLUCAP 197 (H) 12/17/2019   GLUCAP 219 (H) 12/16/2019   GLUCAP 211 (H) 12/16/2019   GLUCAP 213 (H) 12/16/2019     Exercise Target Goals: Exercise  Program Goal: Individual exercise prescription set using results from initial 6 min walk test and THRR while considering  patient's activity barriers and safety.   Exercise Prescription Goal: Starting with aerobic activity 30 plus minutes a day, 3 days per week for initial exercise prescription. Provide home exercise prescription and guidelines that participant acknowledges understanding prior to discharge.  Activity Barriers & Risk Stratification:  Activity Barriers & Cardiac Risk Stratification - 02/06/20 1516      Activity Barriers & Cardiac Risk Stratification   Activity Barriers Deconditioning;Shortness of Breath    Cardiac Risk Stratification High           6 Minute Walk:  6 Minute Walk    Row Name 02/06/20 1515         6 Minute Walk   Phase Initial     Distance 1570 feet     Walk  Time 6 minutes     # of Rest Breaks 0     MPH 3     METS 3.9     RPE 12     Perceived Dyspnea  0     VO2 Peak 13.7     Symptoms Yes (comment)     Comments SOB +1     Resting HR 87 bpm     Resting BP 108/70     Resting Oxygen Saturation  98 %     Exercise Oxygen Saturation  during 6 min walk 94 %     Max Ex. HR 137 bpm     Max Ex. BP 128/76     2 Minute Post BP 110/72            Oxygen Initial Assessment:   Oxygen Re-Evaluation:   Oxygen Discharge (Final Oxygen Re-Evaluation):   Initial Exercise Prescription:  Initial Exercise Prescription - 02/06/20 1500      Date of Initial Exercise RX and Referring Provider   Date 02/06/20    Referring Provider [name]    Expected Discharge Date 04/03/20      NuStep   Level 2    SPM 75    Minutes 15    METs 2.5      Track   Laps 12    Minutes 15    METs 2.39      Prescription Details   Frequency (times per week) 3x    Duration Progress to 10 minutes continuous walking  at current work load and total walking time to 30-45 min      Intensity   THRR 40-80% of Max Heartrate 74-148    Ratings of Perceived Exertion 11-13    Perceived Dyspnea 0-4      Progression   Progression Continue progressive overload as per policy without signs/symptoms or physical distress.      Resistance Training   Training Prescription Yes    Weight 5lbs    Reps 10-15           Perform Capillary Blood Glucose checks as needed.  Exercise Prescription Changes:   Exercise Comments:   Exercise Goals and Review:  Exercise Goals    Row Name 02/06/20 1517             Exercise Goals   Increase Physical Activity Yes       Intervention Provide advice, education, support and counseling about physical activity/exercise needs.;Develop an individualized exercise prescription for aerobic and resistive training based on initial evaluation findings, risk stratification, comorbidities and participant's personal goals.  Expected Outcomes Short Term: Attend rehab on a regular basis to increase amount of physical activity.;Long Term: Add in home exercise to make exercise part of routine and to increase amount of physical activity.;Long Term: Exercising regularly at least 3-5 days a week.       Increase Strength and Stamina Yes       Intervention Provide advice, education, support and counseling about physical activity/exercise needs.;Develop an individualized exercise prescription for aerobic and resistive training based on initial evaluation findings, risk stratification, comorbidities and participant's personal goals.       Expected Outcomes Short Term: Increase workloads from initial exercise prescription for resistance, speed, and METs.;Short Term: Perform resistance training exercises routinely during rehab and add in resistance training at home;Long Term: Improve cardiorespiratory fitness, muscular endurance and strength as measured by increased METs and functional capacity ( )       Able to understand and use rate of perceived exertion (RPE) scale Yes       Intervention Provide education and explanation on how to use RPE scale       Expected Outcomes Short Term: Able to use RPE daily in rehab to express subjective intensity level;Long Term:  Able to use RPE to guide intensity level when exercising independently       Knowledge and understanding of Target Heart Rate Range (THRR) Yes       Intervention Provide education and explanation of THRR including how the numbers were predicted and where they are located for reference       Expected Outcomes Short Term: Able to state/look up THRR;Long Term: Able to use THRR to govern intensity when exercising independently;Short Term: Able to use daily as guideline for intensity in rehab       Able to check pulse independently Yes       Intervention Provide education and demonstration on how to check pulse in carotid and radial arteries.;Review the importance of being able to  check your own pulse for safety during independent exercise       Expected Outcomes Short Term: Able to explain why pulse checking is important during independent exercise;Long Term: Able to check pulse independently and accurately       Understanding of Exercise Prescription Yes       Intervention Provide education, explanation, and written materials on patient's individual exercise prescription       Expected Outcomes Short Term: Able to explain program exercise prescription;Long Term: Able to explain home exercise prescription to exercise independently              Exercise Goals Re-Evaluation :    Discharge Exercise Prescription (Final Exercise Prescription Changes):   Nutrition:  Target Goals: Understanding of nutrition guidelines, daily intake of sodium 1500mg , cholesterol 200mg , calories 30% from fat and 7% or less from saturated fats, daily to have 5 or more servings of fruits and vegetables.  Biometrics:  Pre Biometrics - 02/06/20 1517      Pre Biometrics   Height 6' 2.25" (1.886 m)    Weight 187.5 kg    Waist Circumference 59 inches    Hip Circumference 58 inches    Waist to Hip Ratio 1.02 %    BMI (Calculated) 52.71    Triceps Skinfold 34 mm    % Body Fat 47.3 %    Grip Strength 45 kg    Flexibility 0 in    Single Leg Stand 5.81 seconds            Nutrition Therapy Plan  and Nutrition Goals:   Nutrition Assessments:   Nutrition Goals Re-Evaluation:   Nutrition Goals Discharge (Final Nutrition Goals Re-Evaluation):   Psychosocial: Target Goals: Acknowledge presence or absence of significant depression and/or stress, maximize coping skills, provide positive support system. Participant is able to verbalize types and ability to use techniques and skills needed for reducing stress and depression.  Initial Review & Psychosocial Screening:  Initial Psych Review & Screening - 02/06/20 1439      Initial Review   Current issues with Current Stress  Concerns    Source of Stress Concerns Chronic Illness    Comments Newly diagnosed heart failure diagnosis      Family Dynamics   Good Support System? Yes   Jill Alexanders lives alone. Jill Alexanders has his parents and brother for support who live nearby     Barriers   Psychosocial barriers to participate in program The patient should benefit from training in stress management and relaxation.      Screening Interventions   Interventions Encouraged to exercise    Expected Outcomes Long Term Goal: Stressors or current issues are controlled or eliminated.           Quality of Life Scores:  Quality of Life - 02/06/20 1520      Quality of Life   Select Quality of Life      Quality of Life Scores   Health/Function Pre 20.6 %    Socioeconomic Pre 23.63 %    Psych/Spiritual Pre 24.86 %    Family Pre 28.8 %    GLOBAL Pre 23.31 %          Scores of 19 and below usually indicate a poorer quality of life in these areas.  A difference of  2-3 points is a clinically meaningful difference.  A difference of 2-3 points in the total score of the Quality of Life Index has been associated with significant improvement in overall quality of life, self-image, physical symptoms, and general health in studies assessing change in quality of life.  PHQ-9: Recent Review Flowsheet Data    Depression screen [facility] 2/9 02/06/2020 02/06/2020   Decreased Interest 0 0   Down, Depressed, Hopeless 0 0   PHQ - 2 Score 0 0     Interpretation of Total Score  Total Score Depression Severity:  1-4 = Minimal depression, 5-9 = Mild depression, 10-14 = Moderate depression, 15-19 = Moderately severe depression, 20-27 = Severe depression   Psychosocial Evaluation and Intervention:   Psychosocial Re-Evaluation:   Psychosocial Discharge (Final Psychosocial Re-Evaluation):   Vocational Rehabilitation: Provide vocational rehab assistance to qualifying candidates.   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 Participant will state understanding/return demonstration of topics presented.  Learning Barriers/Preferences:  Learning Barriers/Preferences - 02/06/20 1522      Learning Barriers/Preferences   Learning Barriers None    Learning Preferences Written Material;Skilled Demonstration;Individual Instruction           Education Topics: Hypertension, Hypertension Reduction -Define heart disease and high blood pressure. Discus how high blood pressure affects the body and ways to reduce high blood pressure.   Exercise and Your  Heart -Discuss why it is important to exercise, the FITT principles of exercise, normal and abnormal responses to exercise, and how to exercise safely.   Angina -Discuss definition of angina, causes of angina, treatment of angina, and how to decrease risk of having angina.   Cardiac Medications -Review what the following cardiac medications are used for, how they affect the body, and side effects that may occur when taking the medications.  Medications include Aspirin, Beta blockers, calcium channel blockers, ACE Inhibitors, angiotensin receptor blockers, diuretics, digoxin, and antihyperlipidemics.   Congestive Heart Failure -Discuss the definition of CHF, how to live with CHF, the signs and symptoms of CHF, and how keep track of weight and sodium intake.   Heart Disease and Intimacy -Discus the effect sexual activity has on the heart, how changes occur during intimacy as we age, and safety during sexual activity.   Smoking Cessation / COPD -Discuss different methods to quit smoking, the health benefits of quitting smoking, and the  definition of COPD.   Nutrition I: Fats -Discuss the types of cholesterol, what cholesterol does to the heart, and how cholesterol levels can be controlled.   Nutrition II: Labels -Discuss the different components of food labels and how to read food label   Heart Parts/Heart Disease and PAD -Discuss the anatomy of the heart, the pathway of blood circulation through the heart, and these are affected by heart disease.   Stress I: Signs and Symptoms -Discuss the causes of stress, how stress may lead to anxiety and depression, and ways to limit stress.   Stress II: Relaxation -Discuss different types of relaxation techniques to limit stress.   Warning Signs of Stroke / TIA -Discuss definition of a stroke, what the signs and symptoms are of a stroke, and how to identify when someone is having stroke.   Knowledge Questionnaire Score:  Knowledge Questionnaire Score - 02/06/20 1520      Knowledge Questionnaire Score   Pre Score 21/24           Core Components/Risk Factors/Patient Goals at Admission:  Personal Goals and Risk Factors at Admission - 02/06/20 1521      Core Components/Risk Factors/Patient Goals on Admission    Weight Management Yes;Obesity;Weight Loss    Intervention Weight Management: Develop a combined nutrition and exercise program designed to reach desired caloric intake, while maintaining appropriate intake of nutrient and fiber, sodium and fats, and appropriate energy expenditure required for the weight goal.;Weight Management: Provide education and appropriate resources to help participant work on and attain dietary goals.;Weight Management/Obesity: Establish reasonable short term and long term weight goals.;Obesity: Provide education and appropriate resources to help participant work on and attain dietary goals.    Admit Weight 413 lb 5.8 oz (187.5 kg)    Goal Weight: Long Term 300 lb (136.1 kg)    Expected Outcomes Short Term: Continue to assess and modify  interventions until short term weight is achieved;Long Term: Adherence to nutrition and physical activity/exercise program aimed toward attainment of established weight goal;Weight Loss: Understanding of general recommendations for a balanced deficit meal plan, which promotes 1-2 lb weight loss per week and includes a negative energy balance of [phone] kcal/d;Understanding recommendations for meals to include 15-35% energy as protein, 25-35% energy from fat, 35-60% energy from carbohydrates, less than 200mg  of dietary cholesterol, 20-35 gm of total fiber daily;Understanding of distribution of calorie intake throughout the day with the consumption of 4-5 meals/snacks    Diabetes Yes    Intervention Provide education about  signs/symptoms and action to take for hypo/hyperglycemia.;Provide education about proper nutrition, including hydration, and aerobic/resistive exercise prescription along with prescribed medications to achieve blood glucose in normal ranges: Fasting glucose 65-99 mg/dL    Expected Outcomes Short Term: Participant verbalizes understanding of the signs/symptoms and immediate care of hyper/hypoglycemia, proper foot care and importance of medication, aerobic/resistive exercise and nutrition plan for blood glucose control.;Long Term: Attainment of HbA1C < 7%.    Heart Failure Yes    Intervention Provide a combined exercise and nutrition program that is supplemented with education, support and counseling about heart failure. Directed toward relieving symptoms such as shortness of breath, decreased exercise tolerance, and extremity edema.    Expected Outcomes Improve functional capacity of life;Short term: Attendance in program 2-3 days a week with increased exercise capacity. Reported lower sodium intake. Reported increased fruit and vegetable intake. Reports medication compliance.;Short term: Daily weights obtained and reported for increase. Utilizing diuretic protocols set by physician.;Long  term: Adoption of self-care skills and reduction of barriers for early signs and symptoms recognition and intervention leading to self-care maintenance.    Hypertension Yes    Intervention Provide education on lifestyle modifcations including regular physical activity/exercise, weight management, moderate sodium restriction and increased consumption of fresh fruit, vegetables, and low fat dairy, alcohol moderation, and smoking cessation.;Monitor prescription use compliance.    Expected Outcomes Short Term: Continued assessment and intervention until BP is < 140/14mm HG in hypertensive participants. < 130/22mm HG in hypertensive participants with diabetes, heart failure or chronic kidney disease.;Long Term: Maintenance of blood pressure at goal levels.    Lipids Yes    Intervention Provide education and support for participant on nutrition & aerobic/resistive exercise along with prescribed medications to achieve LDL 70mg , HDL >40mg .    Expected Outcomes Short Term: Participant states understanding of desired cholesterol values and is compliant with medications prescribed. Participant is following exercise prescription and nutrition guidelines.;Long Term: Cholesterol controlled with medications as prescribed, with individualized exercise RX and with personalized nutrition plan. Value goals: LDL < 70mg , HDL > 40 mg.           Core Components/Risk Factors/Patient Goals Review:    Core Components/Risk Factors/Patient Goals at Discharge (Final Review):    ITP Comments:  ITP Comments    Row Name 02/06/20 1438           ITP Comments Dr. 02/08/20 MD, Medical Director              Comments: Armanda Magic attended orientation on 02/06/2020 to review rules and guidelines for program.  Completed 6 minute walk test, Intitial ITP, and exercise prescription.  VSS. Telemetry-Sinus Rhythm downward QRS this has been previously .  Asymptomatic. Safety measures and social distancing in place per CDC  guidelines.02/08/2020, RN,BSN 02/06/2020 3:50 PM

## 2020-02-10 ENCOUNTER — Encounter (HOSPITAL_COMMUNITY)
Admission: RE | Admit: 2020-02-10 | Discharge: 2020-02-10 | Disposition: A | Discharge status: Home / Self Care | Payer: 59 | Source: Ambulatory Visit | Attending: Internal Medicine | Admitting: Internal Medicine

## 2020-02-10 ENCOUNTER — Other Ambulatory Visit: Payer: Self-pay

## 2020-02-10 DIAGNOSIS — I5022 Chronic systolic (congestive) heart failure: Secondary | ICD-10-CM

## 2020-02-10 LAB — GLUCOSE, CAPILLARY
Glucose-Capillary: 187 mg/dL — ABNORMAL HIGH (ref 70–99)
Glucose-Capillary: 219 mg/dL — ABNORMAL HIGH (ref 70–99)

## 2020-02-10 NOTE — Progress Notes (Signed)
Daily Session Note  Patient Details  Name: Sean Lawson MRN: [id number] Date of Birth: [birth year] Referring Provider:     CARDIAC REHAB PHASE II ORIENTATION from 02/06/2020 in [place]  Referring Provider Loralie Champagne MD      Encounter Date: 02/10/2020  Check In:  Session Check In - 02/10/20 1516      Check-In   Supervising physician immediately available to respond to emergencies [facility] immediately available    Physician(s) Dr. Lonny Prude    Location [facility]    Staff Present Barnet Pall, RN, Milus Glazier, MS, EP-C, CCRP;Jessica Hassell Done, MS, ACSM-CEP, Exercise Physiologist;Portia Rollene Rotunda, RN, Deland Pretty, MS, ACSM CEP, Exercise Physiologist    Virtual Visit No    Medication changes reported     No    Fall or balance concerns reported    No    Tobacco Cessation No Change    Current number of cigarettes/nicotine per day     0    Warm-up and Cool-down Performed on first and last piece of equipment    Resistance Training Performed Yes    VAD Patient? No    PAD/SET Patient? No      Pain Assessment   Currently in Pain? No/denies    Pain Score 0-No pain    Multiple Pain Sites No           Capillary Blood Glucose: Results for orders placed or performed during the hospital encounter of 02/10/20 (from the past 24 hour(s))  Glucose, capillary     Status: Abnormal   Collection Time: 02/10/20  3:05 PM  Result Value Ref Range   Glucose-Capillary 187 (H) 70 - 99 mg/dL  Glucose, capillary     Status: Abnormal   Collection Time: 02/10/20  4:04 PM  Result Value Ref Range   Glucose-Capillary 219 (H) 70 - 99 mg/dL     Exercise Prescription Changes - 02/10/20 1625      Response to Exercise   Blood Pressure (Admit) 114/76    Blood Pressure (Exercise) 128/70    Blood Pressure (Exit) 118/68    Heart Rate (Admit) 90 bpm    Heart Rate (Exercise) 126 bpm    Heart Rate (Exit) 80 bpm    Rating of Perceived  Exertion (Exercise) 11    Symptoms None    Comments Pt's first day of exercise in the CRP2 program.    Duration Progress to 30 minutes of  aerobic without signs/symptoms of physical distress    Intensity THRR unchanged      Progression   Progression Continue to progress workloads to maintain intensity without signs/symptoms of physical distress.    Average METs 2.6      Resistance Training   Training Prescription Yes    Weight 5 lbs    Reps 10-15    Time 10 Minutes      Interval Training   Interval Training No      NuStep   Level 2    SPM 80    Minutes 15    METs 2      Track   Laps 18    Minutes 15    METs 3.09           Social History   Tobacco Use  Smoking Status Never Smoker  Smokeless Tobacco Former Systems developer  . Types: Snuff    Goals Met:  Exercise tolerated well No report of cardiac concerns or symptoms Strength training completed today  Goals Unmet:  Not Applicable  Comments: Sean Lawson started cardiac rehab today.  Pt tolerated light exercise without difficulty. VSS, telemetry-Sinus Rhythm downward QRS, asymptomatic.  Medication list reconciled. Pt denies barriers to medicaiton compliance.  PSYCHOSOCIAL ASSESSMENT:  PHQ-0. Pt exhibits positive coping skills, hopeful outlook with supportive family. No psychosocial needs identified at this time, no psychosocial interventions necessary.    Pt enjoys coaching high school football.   Pt oriented to exercise equipment and routine.    Understanding verbalized.Barnet Pall, RN,BSN 02/11/2020 10:28 AM   Dr. Fransico Him is Medical Director for Cardiac Rehab at [facility].

## 2020-02-12 ENCOUNTER — Other Ambulatory Visit: Payer: Self-pay

## 2020-02-12 ENCOUNTER — Encounter (HOSPITAL_COMMUNITY)
Admission: RE | Admit: 2020-02-12 | Discharge: 2020-02-12 | Disposition: A | Discharge status: Home / Self Care | Payer: 59 | Source: Ambulatory Visit | Attending: Internal Medicine | Admitting: Internal Medicine

## 2020-02-12 DIAGNOSIS — I5022 Chronic systolic (congestive) heart failure: Secondary | ICD-10-CM | POA: Diagnosis not present

## 2020-02-12 LAB — GLUCOSE, CAPILLARY
Glucose-Capillary: 210 mg/dL — ABNORMAL HIGH (ref 70–99)
Glucose-Capillary: 206 mg/dL — ABNORMAL HIGH (ref 70–99)

## 2020-02-12 NOTE — Progress Notes (Signed)
Sean Lawson 34 y.o. male Nutrition Note  Visit Diagnosis: Chronic systolic CHF (congestive heart failure) (HCC)   Past Medical History:  Diagnosis Date  . Chronic systolic CHF (congestive heart failure) (HCC) 12/13/2019  . Diabetes mellitus without complication (HCC)   . Hypertension      Medications reviewed.   Current Outpatient Medications:  .  augmented betamethasone dipropionate (DIPROLENE-AF) 0.05 % cream, Apply 1 application topically daily as needed for rash., Disp: , Rfl:  .  carvedilol (COREG) 3.125 MG tablet, Take 1 tablet (3.125 mg total) by mouth 2 (two) times daily with a meal., Disp: 60 tablet, Rfl: 6 .  Dapagliflozin-metFORMIN HCl ER (XIGDUO XR) 09-998 MG TB24, Take 1 tablet by mouth 2 (two) times daily. , Disp: , Rfl:  .  digoxin (LANOXIN) 0.125 MG tablet, Take 1 tablet (0.125 mg total) by mouth daily., Disp: 30 tablet, Rfl: 6 .  furosemide (LASIX) 80 MG tablet, Take 80 mg by mouth daily as needed for fluid or edema., Disp: , Rfl:  .  magnesium oxide (MAG-OX) 400 (241.3 Mg) MG tablet, Take 1 tablet (400 mg total) by mouth daily., Disp: 30 tablet, Rfl: 6 .  ONETOUCH VERIO test strip, 1 each 2 (two) times daily. Use as directed to check blood sugar twice daily., Disp: , Rfl:  .  OZEMPIC, 0.25 OR 0.5 MG/DOSE, 2 MG/1.5ML SOPN, Inject 0.25 mg into the skin once a week., Disp: , Rfl:  .  pantoprazole (PROTONIX) 40 MG tablet, Take 1 tablet (40 mg total) by mouth daily., Disp: 30 tablet, Rfl: 0 .  sacubitril-valsartan (ENTRESTO) 97-103 MG, Take 1 tablet by mouth 2 (two) times daily., Disp: 180 tablet, Rfl: 3 .  spironolactone (ALDACTONE) 25 MG tablet, Take 1 tablet (25 mg total) by mouth daily., Disp: 30 tablet, Rfl: 6 .  warfarin (COUMADIN) 5 MG tablet, Take 1.5 tablets (7.5 mg total) by mouth daily at 4 PM., Disp: 45 tablet, Rfl: 0   Ht Readings from Last 1 Encounters:  02/06/20 6' 2.25" (1.886 m)     Wt Readings from Last 3 Encounters:  02/06/20 (!) 413 lb 5.8 oz  (187.5 kg)  01/20/20 (!) 425 lb 9.6 oz (193.1 kg)  12/23/19 (!) 427 lb 12.8 oz (194 kg)     There is no height or weight on file to calculate BMI.   Social History   Tobacco Use  Smoking Status Never Smoker  Smokeless Tobacco Former Neurosurgeon  . Types: Snuff     Lab Results  Component Value Date   CHOL 122 11/28/2019   Lab Results  Component Value Date   HDL 22 (L) 11/28/2019   Lab Results  Component Value Date   LDLCALC 82 11/28/2019   Lab Results  Component Value Date   TRIG 89 11/28/2019     Lab Results  Component Value Date   HGBA1C 11.4 (H) 11/28/2019     CBG (last 3)  Recent Labs    02/10/20 1505 02/10/20 1604  GLUCAP 187* 219*     Nutrition Note  Spoke with pt. Nutrition Plan and Nutrition Survey goals reviewed with pt. Pt is following a Heart Healthy diet. Pt wants to lose wt. Pt has been trying to lose wt by avoiding fast food, limiting carbs to 64 g/meal and limiting sodium 2000 mg/day.  Pt has Type 2 Diabetes. Pt checks CBG's 2 times a day. Fasting CBG's reportedly 190-200 mg/dL PPG 160 mg/dl. No previous diabetes education.  Pt with dx of CHF. Per  discussion, pt does use canned/convenience foods but tries to pick low sodium options. Pt does not add salt to food. Pt does not eat out frequently.   Pt expressed understanding of the information reviewed.   Nutrition Diagnosis Excessive carbohydrate intake related to food preferences and lack of food related knowledge as evidenced by A1C 11.4 and fasting CBGs in 190's mg/dl  Nutrition Intervention ? Pt's individual nutrition plan reviewed with pt. ? Benefits of adopting Heart Healthy diet discussed when Medficts reviewed.   ? Continue client-centered nutrition education by RD, as part of interdisciplinary care.  Goal(s) ? Pt to identify food quantities necessary to achieve weight loss of 6-24 lb at graduation from cardiac rehab.  ? CBG concentrations in the normal range or as close to normal as is  safely possible. ? Improved blood glucose control as evidenced by pt's A1c trending from 11.4 toward less than 7.0.  Plan:   Will provide client-centered nutrition education as part of interdisciplinary care  Monitor and evaluate progress toward nutrition goal with team.   Andrey Campanile, MS, RDN, LDN

## 2020-02-14 ENCOUNTER — Other Ambulatory Visit: Payer: Self-pay

## 2020-02-14 ENCOUNTER — Encounter (HOSPITAL_COMMUNITY)
Admission: RE | Admit: 2020-02-14 | Discharge: 2020-02-14 | Disposition: A | Discharge status: Home / Self Care | Payer: 59 | Source: Ambulatory Visit | Attending: Internal Medicine | Admitting: Internal Medicine

## 2020-02-14 DIAGNOSIS — I5022 Chronic systolic (congestive) heart failure: Secondary | ICD-10-CM | POA: Diagnosis not present

## 2020-02-17 ENCOUNTER — Other Ambulatory Visit: Payer: Self-pay

## 2020-02-17 ENCOUNTER — Encounter (HOSPITAL_COMMUNITY)
Admission: RE | Admit: 2020-02-17 | Discharge: 2020-02-17 | Disposition: A | Discharge status: Home / Self Care | Payer: 59 | Source: Ambulatory Visit | Attending: Internal Medicine | Admitting: Internal Medicine

## 2020-02-17 VITALS — Wt >= 6400 oz

## 2020-02-17 DIAGNOSIS — I5022 Chronic systolic (congestive) heart failure: Secondary | ICD-10-CM

## 2020-02-18 NOTE — Progress Notes (Signed)
Cardiac Individual Treatment Plan  Patient Details  Name: Sean Lawson MRN: [id number] Date of Birth: [birth year] Referring Provider:     CARDIAC REHAB PHASE II ORIENTATION from 02/06/2020 in [facility] CARDIAC REHAB  Referring Provider [name]      Initial Encounter Date:    CARDIAC REHAB PHASE II ORIENTATION from 02/06/2020 in [facility] CARDIAC REHAB  Date 02/06/20      Visit Diagnosis: Chronic systolic CHF (congestive heart failure) (HCC)  Patient's Home Medications on Admission:  Current Outpatient Medications:  .  augmented betamethasone dipropionate (DIPROLENE-AF) 0.05 % cream, Apply 1 application topically daily as needed for rash., Disp: , Rfl:  .  carvedilol (COREG) 3.125 MG tablet, Take 1 tablet (3.125 mg total) by mouth 2 (two) times daily with a meal., Disp: 60 tablet, Rfl: 6 .  Dapagliflozin-metFORMIN HCl ER (XIGDUO XR) 09-998 MG TB24, Take 1 tablet by mouth 2 (two) times daily. , Disp: , Rfl:  .  digoxin (LANOXIN) 0.125 MG tablet, Take 1 tablet (0.125 mg total) by mouth daily., Disp: 30 tablet, Rfl: 6 .  furosemide (LASIX) 80 MG tablet, Take 80 mg by mouth daily as needed for fluid or edema., Disp: , Rfl:  .  magnesium oxide (MAG-OX) 400 (241.3 Mg) MG tablet, Take 1 tablet (400 mg total) by mouth daily., Disp: 30 tablet, Rfl: 6 .  ONETOUCH VERIO test strip, 1 each 2 (two) times daily. Use as directed to check blood sugar twice daily., Disp: , Rfl:  .  OZEMPIC, 0.25 OR 0.5 MG/DOSE, 2 MG/1.5ML SOPN, Inject 0.25 mg into the skin once a week., Disp: , Rfl:  .  pantoprazole (PROTONIX) 40 MG tablet, Take 1 tablet (40 mg total) by mouth daily., Disp: 30 tablet, Rfl: 0 .  sacubitril-valsartan (ENTRESTO) 97-103 MG, Take 1 tablet by mouth 2 (two) times daily., Disp: 180 tablet, Rfl: 3 .  spironolactone (ALDACTONE) 25 MG tablet, Take 1 tablet (25 mg total) by mouth daily., Disp: 30 tablet, Rfl: 6 .  warfarin (COUMADIN) 5 MG tablet, Take  1.5 tablets (7.5 mg total) by mouth daily at 4 PM., Disp: 45 tablet, Rfl: 0  Past Medical History: Past Medical History:  Diagnosis Date  . Chronic systolic CHF (congestive heart failure) (HCC) 12/13/2019  . Diabetes mellitus without complication (HCC)   . Hypertension     Tobacco Use: Social History   Tobacco Use  Smoking Status Never Smoker  Smokeless Tobacco Former Neurosurgeon  . Types: Snuff    Labs: Recent Review Flowsheet Data    Labs for ITP Cardiac and Pulmonary Rehab Latest Ref Rng & Units 11/30/2019 12/01/2019 12/02/2019 12/02/2019 12/02/2019   Cholestrol 0 - 200 mg/dL - - - - -   LDLCALC 0 - 99 mg/dL - - - - -   HDL >82 mg/dL - - - - -   Trlycerides <150 mg/dL - - - - -   Hemoglobin A1c 4.8 - 5.6 % - - - - -   HCO3 20.0 - 28.0 mmol/L - - - 42.0(H) 42.3(H)   TCO2 22 - 32 mmol/L - - - 44(H) 44(H)   O2SAT % 75.2 78.2 64.7 60.0 56.0      Capillary Blood Glucose: Lab Results  Component Value Date   GLUCAP 206 (H) 02/12/2020   GLUCAP 210 (H) 02/12/2020   GLUCAP 219 (H) 02/10/2020   GLUCAP 187 (H) 02/10/2020   GLUCAP 223 (H) 12/17/2019     Exercise Target Goals: Exercise  Program Goal: Individual exercise prescription set using results from initial 6 min walk test and THRR while considering  patient's activity barriers and safety.   Exercise Prescription Goal: Starting with aerobic activity 30 plus minutes a day, 3 days per week for initial exercise prescription. Provide home exercise prescription and guidelines that participant acknowledges understanding prior to discharge.  Activity Barriers & Risk Stratification:  Activity Barriers & Cardiac Risk Stratification - 02/06/20 1516      Activity Barriers & Cardiac Risk Stratification   Activity Barriers Deconditioning;Shortness of Breath    Cardiac Risk Stratification High           6 Minute Walk:  6 Minute Walk    Row Name 02/06/20 1515         6 Minute Walk   Phase Initial     Distance 1570 feet     Walk  Time 6 minutes     # of Rest Breaks 0     MPH 3     METS 3.9     RPE 12     Perceived Dyspnea  0     VO2 Peak 13.7     Symptoms Yes (comment)     Comments SOB +1     Resting HR 87 bpm     Resting BP 108/70     Resting Oxygen Saturation  98 %     Exercise Oxygen Saturation  during 6 min walk 94 %     Max Ex. HR 137 bpm     Max Ex. BP 128/76     2 Minute Post BP 110/72            Oxygen Initial Assessment:   Oxygen Re-Evaluation:   Oxygen Discharge (Final Oxygen Re-Evaluation):   Initial Exercise Prescription:  Initial Exercise Prescription - 02/06/20 1500      Date of Initial Exercise RX and Referring Provider   Date 02/06/20    Referring Provider [name]    Expected Discharge Date 04/03/20      NuStep   Level 2    SPM 75    Minutes 15    METs 2.5      Track   Laps 12    Minutes 15    METs 2.39      Prescription Details   Frequency (times per week) 3x    Duration Progress to 10 minutes continuous walking  at current work load and total walking time to 30-45 min      Intensity   THRR 40-80% of Max Heartrate 74-148    Ratings of Perceived Exertion 11-13    Perceived Dyspnea 0-4      Progression   Progression Continue progressive overload as per policy without signs/symptoms or physical distress.      Resistance Training   Training Prescription Yes    Weight 5lbs    Reps 10-15           Perform Capillary Blood Glucose checks as needed.  Exercise Prescription Changes:   Exercise Prescription Changes    Row Name 02/10/20 1625             Response to Exercise   Blood Pressure (Admit) 114/76       Blood Pressure (Exercise) 128/70       Blood Pressure (Exit) 118/68       Heart Rate (Admit) 90 bpm       Heart Rate (Exercise) 126 bpm       Heart Rate (  Exit) 80 bpm       Rating of Perceived Exertion (Exercise) 11       Symptoms None       Comments Pt's first day of exercise in the CRP2 program.       Duration Progress to 30  minutes of  aerobic without signs/symptoms of physical distress       Intensity THRR unchanged         Progression   Progression Continue to progress workloads to maintain intensity without signs/symptoms of physical distress.       Average METs 2.6         Resistance Training   Training Prescription Yes       Weight 5 lbs       Reps 10-15       Time 10 Minutes         Interval Training   Interval Training No         NuStep   Level 2       SPM 80       Minutes 15       METs 2         Track   Laps 18       Minutes 15       METs 3.09              Exercise Comments:   Exercise Comments    Row Name 02/10/20 1630           Exercise Comments Pt's first day of exercise in the CRP2 program. Pt tolerated exercise well.              Exercise Goals and Review:   Exercise Goals    Row Name 02/06/20 1517             Exercise Goals   Increase Physical Activity Yes       Intervention Provide advice, education, support and counseling about physical activity/exercise needs.;Develop an individualized exercise prescription for aerobic and resistive training based on initial evaluation findings, risk stratification, comorbidities and participant's personal goals.       Expected Outcomes Short Term: Attend rehab on a regular basis to increase amount of physical activity.;Long Term: Add in home exercise to make exercise part of routine and to increase amount of physical activity.;Long Term: Exercising regularly at least 3-5 days a week.       Increase Strength and Stamina Yes       Intervention Provide advice, education, support and counseling about physical activity/exercise needs.;Develop an individualized exercise prescription for aerobic and resistive training based on initial evaluation findings, risk stratification, comorbidities and participant's personal goals.       Expected Outcomes Short Term: Increase workloads from initial exercise prescription for resistance,  speed, and METs.;Short Term: Perform resistance training exercises routinely during rehab and add in resistance training at home;Long Term: Improve cardiorespiratory fitness, muscular endurance and strength as measured by increased METs and functional capacity ( )       Able to understand and use rate of perceived exertion (RPE) scale Yes       Intervention Provide education and explanation on how to use RPE scale       Expected Outcomes Short Term: Able to use RPE daily in rehab to express subjective intensity level;Long Term:  Able to use RPE to guide intensity level when exercising independently       Knowledge and understanding of Target Heart Rate Range (THRR) Yes  Intervention Provide education and explanation of THRR including how the numbers were predicted and where they are located for reference       Expected Outcomes Short Term: Able to state/look up THRR;Long Term: Able to use THRR to govern intensity when exercising independently;Short Term: Able to use daily as guideline for intensity in rehab       Able to check pulse independently Yes       Intervention Provide education and demonstration on how to check pulse in carotid and radial arteries.;Review the importance of being able to check your own pulse for safety during independent exercise       Expected Outcomes Short Term: Able to explain why pulse checking is important during independent exercise;Long Term: Able to check pulse independently and accurately       Understanding of Exercise Prescription Yes       Intervention Provide education, explanation, and written materials on patient's individual exercise prescription       Expected Outcomes Short Term: Able to explain program exercise prescription;Long Term: Able to explain home exercise prescription to exercise independently              Exercise Goals Re-Evaluation :  Exercise Goals Re-Evaluation    Row Name 02/10/20 1628             Exercise Goal  Re-Evaluation   Exercise Goals Review Increase Physical Activity;Increase Strength and Stamina;Able to understand and use rate of perceived exertion (RPE) scale;Knowledge and understanding of Target Heart Rate Range (THRR);Able to check pulse independently;Understanding of Exercise Prescription       Comments Pt's first day of exercise in the CRP2 program. Pt tolerated exercise Rx well and understands exercise RX, RPE scale and THRR.       Expected Outcomes Will continue to monitor patient and progress as tolerated.               Discharge Exercise Prescription (Final Exercise Prescription Changes):  Exercise Prescription Changes - 02/10/20 1625      Response to Exercise   Blood Pressure (Admit) 114/76    Blood Pressure (Exercise) 128/70    Blood Pressure (Exit) 118/68    Heart Rate (Admit) 90 bpm    Heart Rate (Exercise) 126 bpm    Heart Rate (Exit) 80 bpm    Rating of Perceived Exertion (Exercise) 11    Symptoms None    Comments Pt's first day of exercise in the CRP2 program.    Duration Progress to 30 minutes of  aerobic without signs/symptoms of physical distress    Intensity THRR unchanged      Progression   Progression Continue to progress workloads to maintain intensity without signs/symptoms of physical distress.    Average METs 2.6      Resistance Training   Training Prescription Yes    Weight 5 lbs    Reps 10-15    Time 10 Minutes      Interval Training   Interval Training No      NuStep   Level 2    SPM 80    Minutes 15    METs 2      Track   Laps 18    Minutes 15    METs 3.09           Nutrition:  Target Goals: Understanding of nutrition guidelines, daily intake of sodium 1500mg , cholesterol 200mg , calories 30% from fat and 7% or less from saturated fats, daily to have 5 or more servings  of fruits and vegetables.  Biometrics:  Pre Biometrics - 02/06/20 1517      Pre Biometrics   Height 6' 2.25" (1.886 m)    Weight 187.5 kg    Waist  Circumference 59 inches    Hip Circumference 58 inches    Waist to Hip Ratio 1.02 %    BMI (Calculated) 52.71    Triceps Skinfold 34 mm    % Body Fat 47.3 %    Grip Strength 45 kg    Flexibility 0 in    Single Leg Stand 5.81 seconds            Nutrition Therapy Plan and Nutrition Goals:  Nutrition Therapy & Goals - 02/12/20 1541      Nutrition Therapy   Diet Low sodium/mod carbs    Drug/Food Interactions Coumadin/Vit K      Personal Nutrition Goals   Nutrition Goal Improved blood glucose control as evidenced by pt's A1c trending from 11.4 toward less than 7.0.    Personal Goal #2 CBG concentrations in the normal range or as close to normal as is safely possible.    Personal Goal #3 Pt to identify food quantities necessary to achieve weight loss of 6-24 lb at graduation from cardiac rehab      Intervention Plan   Intervention Prescribe, educate and counsel regarding individualized specific dietary modifications aiming towards targeted core components such as weight, hypertension, lipid management, diabetes, heart failure and other comorbidities.    Expected Outcomes Short Term Goal: Understand basic principles of dietary content, such as calories, fat, sodium, cholesterol and nutrients.           Nutrition Assessments:  Nutrition Assessments - 02/11/20 1037      MEDFICTS Scores   Pre Score 42           Nutrition Goals Re-Evaluation:  Nutrition Goals Re-Evaluation    Row Name 02/12/20 1542 02/18/20 0905           Goals   Current Weight 413 lb (187.3 kg) 409 lb 9.8 oz (185.8 kg)      Nutrition Goal Improved blood glucose control as evidenced by pt's A1c trending from 11.4 toward less than 7.0. --        Personal Goal #2 Re-Evaluation   Personal Goal #2 CBG concentrations in the normal range or as close to normal as is safely possible. --        Personal Goal #3 Re-Evaluation   Personal Goal #3 Pt to identify food quantities necessary to achieve weight loss of  6-24 lb at graduation from cardiac rehab --             Nutrition Goals Discharge (Final Nutrition Goals Re-Evaluation):  Nutrition Goals Re-Evaluation - 02/18/20 0905      Goals   Current Weight 409 lb 9.8 oz (185.8 kg)           Psychosocial: Target Goals: Acknowledge presence or absence of significant depression and/or stress, maximize coping skills, provide positive support system. Participant is able to verbalize types and ability to use techniques and skills needed for reducing stress and depression.  Initial Review & Psychosocial Screening:  Initial Psych Review & Screening - 02/06/20 1439      Initial Review   Current issues with Current Stress Concerns    Source of Stress Concerns Chronic Illness    Comments Newly diagnosed heart failure diagnosis      Family Dynamics   Good Support System? Yes  Sean Lawson lives alone. Sean Lawson has his parents and brother for support who live nearby     Barriers   Psychosocial barriers to participate in program The patient should benefit from training in stress management and relaxation.      Screening Interventions   Interventions Encouraged to exercise    Expected Outcomes Long Term Goal: Stressors or current issues are controlled or eliminated.           Quality of Life Scores:  Quality of Life - 02/06/20 1520      Quality of Life   Select Quality of Life      Quality of Life Scores   Health/Function Pre 20.6 %    Socioeconomic Pre 23.63 %    Psych/Spiritual Pre 24.86 %    Family Pre 28.8 %    GLOBAL Pre 23.31 %          Scores of 19 and below usually indicate a poorer quality of life in these areas.  A difference of  2-3 points is a clinically meaningful difference.  A difference of 2-3 points in the total score of the Quality of Life Index has been associated with significant improvement in overall quality of life, self-image, physical symptoms, and general health in studies assessing change in quality of  life.  PHQ-9: Recent Review Flowsheet Data    Depression screen [facility] 2/9 02/06/2020 02/06/2020   Decreased Interest 0 0   Down, Depressed, Hopeless 0 0   PHQ - 2 Score 0 0     Interpretation of Total Score  Total Score Depression Severity:  1-4 = Minimal depression, 5-9 = Mild depression, 10-14 = Moderate depression, 15-19 = Moderately severe depression, 20-27 = Severe depression   Psychosocial Evaluation and Intervention:   Psychosocial Re-Evaluation:  Psychosocial Re-Evaluation    Row Name 02/18/20 1527             Psychosocial Re-Evaluation   Current issues with Current Stress Concerns       Comments Sean Lawson has not voiced any increased stressors or concerns.       Expected Outcomes Patient will have decreased stressors upon completion of phase 2 cardiac rehab       Interventions Encouraged to attend Cardiac Rehabilitation for the exercise;Stress management education       Continue Psychosocial Services  No Follow up required       Comments Newly diagnosed heart failure diagnosis         Initial Review   Source of Stress Concerns Chronic Illness              Psychosocial Discharge (Final Psychosocial Re-Evaluation):  Psychosocial Re-Evaluation - 02/18/20 1527      Psychosocial Re-Evaluation   Current issues with Current Stress Concerns    Comments Sean Lawson has not voiced any increased stressors or concerns.    Expected Outcomes Patient will have decreased stressors upon completion of phase 2 cardiac rehab    Interventions Encouraged to attend Cardiac Rehabilitation for the exercise;Stress management education    Continue Psychosocial Services  No Follow up required    Comments Newly diagnosed heart failure diagnosis      Initial Review   Source of Stress Concerns Chronic Illness           Vocational Rehabilitation: Provide vocational rehab assistance to qualifying candidates.   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 Participant will state understanding/return demonstration of topics presented.  Learning Barriers/Preferences:  Learning Barriers/Preferences - 02/06/20 1522      Learning Barriers/Preferences   Learning Barriers None    Learning Preferences Written Material;Skilled Demonstration;Individual Instruction           Education Topics: Hypertension, Hypertension Reduction -Define heart disease and high blood pressure. Discus how high blood pressure affects the body and ways to reduce high blood pressure.   Exercise and Your Heart -Discuss why it is important to exercise, the FITT principles of exercise, normal and abnormal responses to exercise, and how to exercise safely.   Angina -Discuss definition of angina, causes of angina, treatment of angina, and how to decrease risk of having angina.   Cardiac Medications -Review what the following cardiac medications are used for, how they affect the body, and side effects that may occur when taking the medications.  Medications include Aspirin, Beta blockers, calcium channel blockers, ACE Inhibitors, angiotensin receptor blockers, diuretics, digoxin, and antihyperlipidemics.   Congestive Heart Failure -Discuss the definition of CHF, how to live with CHF, the signs and symptoms of CHF, and how keep track of weight and sodium intake.   Heart Disease and Intimacy -Discus the effect sexual activity has on the heart, how changes occur during intimacy as we age, and safety during sexual activity.   Smoking Cessation / COPD -Discuss different methods to quit smoking, the health benefits of quitting smoking, and the definition of  COPD.   Nutrition I: Fats -Discuss the types of cholesterol, what cholesterol does to the heart, and how cholesterol levels can be controlled.   Nutrition II: Labels -Discuss the different components of food labels and how to read food label   Heart Parts/Heart Disease and PAD -Discuss the anatomy of the heart, the pathway of blood circulation through the heart, and these are affected by heart disease.   Stress I: Signs and Symptoms -Discuss the causes of stress, how stress may lead to anxiety and depression, and ways to limit stress.   Stress II: Relaxation -Discuss different types of relaxation techniques to limit stress.   Warning Signs of Stroke / TIA -Discuss definition of a stroke, what the signs and symptoms are of a stroke, and how to identify when someone is having stroke.   Knowledge Questionnaire Score:  Knowledge Questionnaire Score - 02/06/20 1520      Knowledge Questionnaire Score   Pre Score 21/24           Core Components/Risk Factors/Patient Goals at Admission:  Personal Goals and Risk Factors at Admission - 02/06/20 1521      Core Components/Risk Factors/Patient Goals on Admission    Weight Management Yes;Obesity;Weight Loss    Intervention Weight Management: Develop a combined nutrition and exercise program designed to reach desired caloric intake, while maintaining appropriate intake of nutrient and fiber, sodium and fats, and appropriate energy expenditure required for the weight goal.;Weight Management: Provide education and appropriate resources to help participant work on and attain dietary goals.;Weight Management/Obesity: Establish reasonable short term and long term weight goals.;Obesity: Provide education and appropriate resources to help participant work on and attain dietary goals.    Admit Weight 413 lb 5.8 oz (187.5 kg)    Goal Weight:  Long Term 300 lb (136.1 kg)    Expected Outcomes Short Term: Continue to assess and modify interventions  until short term weight is achieved;Long Term: Adherence to nutrition and physical activity/exercise program aimed toward attainment of established weight goal;Weight Loss: Understanding of general recommendations for a balanced deficit meal plan, which promotes 1-2 lb weight loss per week and includes a negative energy balance of [phone] kcal/d;Understanding recommendations for meals to include 15-35% energy as protein, 25-35% energy from fat, 35-60% energy from carbohydrates, less than 200mg  of dietary cholesterol, 20-35 gm of total fiber daily;Understanding of distribution of calorie intake throughout the day with the consumption of 4-5 meals/snacks    Diabetes Yes    Intervention Provide education about signs/symptoms and action to take for hypo/hyperglycemia.;Provide education about proper nutrition, including hydration, and aerobic/resistive exercise prescription along with prescribed medications to achieve blood glucose in normal ranges: Fasting glucose 65-99 mg/dL    Expected Outcomes Short Term: Participant verbalizes understanding of the signs/symptoms and immediate care of hyper/hypoglycemia, proper foot care and importance of medication, aerobic/resistive exercise and nutrition plan for blood glucose control.;Long Term: Attainment of HbA1C < 7%.    Heart Failure Yes    Intervention Provide a combined exercise and nutrition program that is supplemented with education, support and counseling about heart failure. Directed toward relieving symptoms such as shortness of breath, decreased exercise tolerance, and extremity edema.    Expected Outcomes Improve functional capacity of life;Short term: Attendance in program 2-3 days a week with increased exercise capacity. Reported lower sodium intake. Reported increased fruit and vegetable intake. Reports medication compliance.;Short term: Daily weights obtained and reported for increase. Utilizing diuretic protocols set by physician.;Long term: Adoption of  self-care skills and reduction of barriers for early signs and symptoms recognition and intervention leading to self-care maintenance.    Hypertension Yes    Intervention Provide education on lifestyle modifcations including regular physical activity/exercise, weight management, moderate sodium restriction and increased consumption of fresh fruit, vegetables, and low fat dairy, alcohol moderation, and smoking cessation.;Monitor prescription use compliance.    Expected Outcomes Short Term: Continued assessment and intervention until BP is < 140/14mm HG in hypertensive participants. < 130/19mm HG in hypertensive participants with diabetes, heart failure or chronic kidney disease.;Long Term: Maintenance of blood pressure at goal levels.    Lipids Yes    Intervention Provide education and support for participant on nutrition & aerobic/resistive exercise along with prescribed medications to achieve LDL 70mg , HDL >40mg .    Expected Outcomes Short Term: Participant states understanding of desired cholesterol values and is compliant with medications prescribed. Participant is following exercise prescription and nutrition guidelines.;Long Term: Cholesterol controlled with medications as prescribed, with individualized exercise RX and with personalized nutrition plan. Value goals: LDL < 70mg , HDL > 40 mg.           Core Components/Risk Factors/Patient Goals Review:   Goals and Risk Factor Review    Row Name 02/11/20 1030 02/18/20 1528           Core Components/Risk Factors/Patient Goals Review   Personal Goals Review Weight Management/Obesity;Lipids;Stress;Heart Failure;Hypertension;Diabetes Weight Management/Obesity;Lipids;Stress;Heart Failure;Hypertension;Diabetes      Review Sean Lawson started cardiac rehab  on 02/11/20. Sean Lawson did well with exercise. Vital signs and CBG's were stable. Sean Lawson continues to do well with exercise. Sean Lawson's vital signs and CBG's have been stable. Sean Lawson has lost 1.7kg so far.       Expected Outcomes Sean Lawson will continue to particiapte in phase 2 cardiac rehab for exercise, nutrition  and lifestyle modifications Sean Lawson will continue to particiapte in phase 2 cardiac rehab for exercise, nutrition and lifestyle modifications             Core Components/Risk Factors/Patient Goals at Discharge (Final Review):   Goals and Risk Factor Review - 02/18/20 1528      Core Components/Risk Factors/Patient Goals Review   Personal Goals Review Weight Management/Obesity;Lipids;Stress;Heart Failure;Hypertension;Diabetes    Review Sean Lawson continues to do well with exercise. Sean Lawson's vital signs and CBG's have been stable. Sean Lawson has lost 1.7kg so far.    Expected Outcomes Sean Lawson will continue to particiapte in phase 2 cardiac rehab for exercise, nutrition and lifestyle modifications           ITP Comments:  ITP Comments    Row Name 02/06/20 1438 02/11/20 1029 02/18/20 1523       ITP Comments Dr. Armanda Magic MD, Medical Director 30 Day ITP Review. Sean Lawson started exercise on 02/10/20 and did well with exercise 30 Day ITP Review. Sean Lawson is off to a good start to exercise. Sean Lawson's vital signs have been stable.            Comments: See ITP comments.Gladstone Lighter, RN,BSN 02/20/2020 12:23 PM

## 2020-02-19 ENCOUNTER — Other Ambulatory Visit: Payer: Self-pay

## 2020-02-19 ENCOUNTER — Encounter (HOSPITAL_COMMUNITY)
Admission: RE | Admit: 2020-02-19 | Discharge: 2020-02-19 | Disposition: A | Discharge status: Home / Self Care | Payer: 59 | Source: Ambulatory Visit | Attending: Internal Medicine | Admitting: Internal Medicine

## 2020-02-19 DIAGNOSIS — I5022 Chronic systolic (congestive) heart failure: Secondary | ICD-10-CM

## 2020-02-21 ENCOUNTER — Encounter (HOSPITAL_COMMUNITY)
Admission: RE | Admit: 2020-02-21 | Discharge: 2020-02-21 | Disposition: A | Discharge status: Home / Self Care | Payer: 59 | Source: Ambulatory Visit | Attending: Internal Medicine | Admitting: Internal Medicine

## 2020-02-21 ENCOUNTER — Other Ambulatory Visit: Payer: Self-pay

## 2020-02-21 DIAGNOSIS — I5022 Chronic systolic (congestive) heart failure: Secondary | ICD-10-CM | POA: Diagnosis not present

## 2020-02-24 ENCOUNTER — Telehealth (HOSPITAL_COMMUNITY): Payer: Self-pay

## 2020-02-24 ENCOUNTER — Encounter (HOSPITAL_COMMUNITY)
Admission: RE | Admit: 2020-02-24 | Discharge: 2020-02-24 | Disposition: A | Discharge status: Home / Self Care | Payer: 59 | Source: Ambulatory Visit | Attending: Internal Medicine | Admitting: Internal Medicine

## 2020-02-24 ENCOUNTER — Other Ambulatory Visit: Payer: Self-pay

## 2020-02-24 ENCOUNTER — Ambulatory Visit (HOSPITAL_COMMUNITY)
Admission: RE | Admit: 2020-02-24 | Discharge: 2020-02-24 | Disposition: A | Discharge status: Home / Self Care | Payer: 59 | Source: Ambulatory Visit | Attending: Cardiology | Admitting: Cardiology

## 2020-02-24 ENCOUNTER — Encounter (HOSPITAL_COMMUNITY): Payer: Self-pay | Admitting: Cardiology

## 2020-02-24 VITALS — BP 108/60 | HR 100 | Ht 75.0 in | Wt >= 6400 oz

## 2020-02-24 DIAGNOSIS — Z7901 Long term (current) use of anticoagulants: Secondary | ICD-10-CM | POA: Insufficient documentation

## 2020-02-24 DIAGNOSIS — F1010 Alcohol abuse, uncomplicated: Secondary | ICD-10-CM | POA: Insufficient documentation

## 2020-02-24 DIAGNOSIS — I428 Other cardiomyopathies: Secondary | ICD-10-CM | POA: Insufficient documentation

## 2020-02-24 DIAGNOSIS — I5022 Chronic systolic (congestive) heart failure: Secondary | ICD-10-CM | POA: Diagnosis not present

## 2020-02-24 DIAGNOSIS — E669 Obesity, unspecified: Secondary | ICD-10-CM | POA: Insufficient documentation

## 2020-02-24 DIAGNOSIS — G4733 Obstructive sleep apnea (adult) (pediatric): Secondary | ICD-10-CM | POA: Insufficient documentation

## 2020-02-24 DIAGNOSIS — E119 Type 2 diabetes mellitus without complications: Secondary | ICD-10-CM | POA: Diagnosis not present

## 2020-02-24 DIAGNOSIS — Z6835 Body mass index (BMI) 35.0-35.9, adult: Secondary | ICD-10-CM | POA: Insufficient documentation

## 2020-02-24 DIAGNOSIS — Z7984 Long term (current) use of oral hypoglycemic drugs: Secondary | ICD-10-CM | POA: Diagnosis not present

## 2020-02-24 DIAGNOSIS — Z833 Family history of diabetes mellitus: Secondary | ICD-10-CM | POA: Insufficient documentation

## 2020-02-24 DIAGNOSIS — R7989 Other specified abnormal findings of blood chemistry: Secondary | ICD-10-CM | POA: Diagnosis not present

## 2020-02-24 DIAGNOSIS — Z8249 Family history of ischemic heart disease and other diseases of the circulatory system: Secondary | ICD-10-CM | POA: Insufficient documentation

## 2020-02-24 DIAGNOSIS — I110 Hypertensive heart disease with heart failure: Secondary | ICD-10-CM | POA: Diagnosis present

## 2020-02-24 DIAGNOSIS — Z86711 Personal history of pulmonary embolism: Secondary | ICD-10-CM | POA: Insufficient documentation

## 2020-02-24 DIAGNOSIS — Z79899 Other long term (current) drug therapy: Secondary | ICD-10-CM | POA: Insufficient documentation

## 2020-02-24 DIAGNOSIS — Z713 Dietary counseling and surveillance: Secondary | ICD-10-CM | POA: Insufficient documentation

## 2020-02-24 LAB — BASIC METABOLIC PANEL
Anion gap: 13 (ref 5–15)
BUN: 24 mg/dL — ABNORMAL HIGH (ref 6–20)
CO2: 25 mmol/L (ref 22–32)
Calcium: 9.2 mg/dL (ref 8.9–10.3)
Chloride: 96 mmol/L — ABNORMAL LOW (ref 98–111)
Creatinine, Ser: 1.45 mg/dL — ABNORMAL HIGH (ref 0.61–1.24)
GFR calc Af Amer: >60 mL/min (ref >60)
GFR calc non Af Amer: >60 mL/min (ref >60)
Glucose, Bld: 219 mg/dL — ABNORMAL HIGH (ref 70–99)
Potassium: 4 mmol/L (ref 3.5–5.1)
Sodium: 134 mmol/L — ABNORMAL LOW (ref 135–145)

## 2020-02-24 LAB — DIGOXIN LEVEL: Digoxin Level: 0.8 ng/mL — ABNORMAL LOW (ref 1.0–2.0)

## 2020-02-24 MED ORDER — CARVEDILOL 6.25 MG PO TABS
6.2500 mg | ORAL_TABLET | Freq: Two times a day (BID) | ORAL | 3 refills | Status: DC
Start: 2020-02-24 — End: 2020-03-17

## 2020-02-24 NOTE — Patient Instructions (Signed)
Increase Carvedilol to 6.25 mg Twice daily   Labs done today, your results will be available in MyChart, we will contact you for abnormal readings.  Please follow up with our heart failure pharmacist in 3 weeks  Your physician recommends that you schedule a follow-up appointment in: 3 months with echocardiogram  If you have any questions or concerns before your next appointment please send [place] a message through [place] or call our office at [phone].    TO LEAVE A MESSAGE FOR THE NURSE SELECT OPTION 2, PLEASE LEAVE A MESSAGE INCLUDING:  YOUR NAME  DATE OF BIRTH  CALL BACK NUMBER  REASON FOR CALL**this is important as we prioritize the call backs  YOU WILL RECEIVE A CALL BACK THE SAME DAY AS LONG AS YOU CALL BEFORE 4:00 PM  At the [facility], you and your health needs are our priority. As part of our continuing mission to provide you with exceptional heart care, we have created designated Provider Care Teams. These Care Teams include your primary Cardiologist (physician) and Advanced Practice Providers (APPs- Physician Assistants and Nurse Practitioners) who all work together to provide you with the care you need, when you need it.   You may see any of the following providers on your designated Care Team at your next follow up:  Dr Arvilla Meres  Dr Carron Curie, NP  Robbie Lis, [place]  Karle Plumber, PharmD   Please be sure to bring in all your medications bottles to every appointment.

## 2020-02-24 NOTE — Telephone Encounter (Signed)
Samara Snide, RN  02/24/2020 5:18 PM EDT Back to Top    Patient advised and verbalized understanding

## 2020-02-24 NOTE — Telephone Encounter (Signed)
-----   Message from Laurey Morale, MD sent at 02/24/2020  4:38 PM EDT ----- Stay hydrated, otherwise no changes

## 2020-02-24 NOTE — Progress Notes (Signed)
[facility] Note   Referring Physician: PCP: [name], MD HF Cardiology: Dr. Shirlee Latch  HPI:  [name] is a 34 y.o. with a history of HTN, obesity, ETOH abuse, systolic heart failure, PE and diabetes who presents for followup of CHF.    Recently admitted early 7/21 with increased dyspnea and leg edema. ECHO showed reduced EF 20-25%. Diuresed with IV lasix and transitioned to po lasix 80 mg daily. Overall diuresed 35 pounds. Had LHC/RHC that showed normal coronaries and elevated filling pressures, CI 2.0. Also new diagnosis of T2DM, Hgb A1c 11.4. Now on metformin + dapagliflozin. He was discharged home 12/03/19 on GDMT w/ plans to repeat 2D echo in 3 months.   Shortly after discharge, he was readmitted on 12/12/19 by IM for acute bilateral PE, he had been sedentary at work and at home. Was referred to ED by PCP for dyspnea and hypoxia. D-dimer elevated at 6.61. EKG showed sinus tachycardia. CT angiogram of the chest confirmed bilateral pulmonary embolism with mild right heart strain. Repeat limited echo showed RV to be mildly enlarged but systolic function normal. LVEF was 30-35%, mildly improved from previous study. BLE Dopplers negative for DVT. Given BMI of 35, not felt to be ideal candidate for DOAC. He was placed on coumadin w/ heparin bridge.   He has lost another 25 lbs compared to last appointment here.  Generally feeling well.  No dyspnea walking on flat ground.  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  Occasional lightheadedness if he stands too fast.  No chest pain.  No orthopnea/PND.   Labs (10/21): digoxin 0.8, K 4, creatinine 1.45  PMH: 1. OSA 2. Obesity 3. Prior ETOH abuse 4. Type 2 diabetes 5. Pulmonary embolus: 7/21, bilateral.  6. Chronic systolic CHF: Nonischemic cardiomyopathy.  - Echo (11/28/19): EF 20-25%, mild LVE, poorly visualized RV.  - RHC/LHC (7/21): No  significant CAD, CI 2.0 - Echo (12/13/19): EF 30-35%, normal RV.  7. HTN  Review of Systems: All systems reviewed and negative except as per HPI.   Current Outpatient Medications  Medication Sig Dispense Refill  . augmented betamethasone dipropionate (DIPROLENE-AF) 0.05 % cream Apply 1 application topically daily as needed for rash.    . carvedilol (COREG) 6.25 MG tablet Take 1 tablet (6.25 mg total) by mouth 2 (two) times daily with a meal. 60 tablet 3  . Dapagliflozin-metFORMIN HCl ER (XIGDUO XR) 09-998 MG TB24 Take 1 tablet by mouth 2 (two) times daily.     . digoxin (LANOXIN) 0.125 MG tablet Take 1 tablet (0.125 mg total) by mouth daily. 30 tablet 6  . furosemide (LASIX) 80 MG tablet Take 80 mg by mouth daily as needed for fluid or edema.    . magnesium oxide (MAG-OX) 400 (241.3 Mg) MG tablet Take 1 tablet (400 mg total) by mouth daily. 30 tablet 6  . ONETOUCH VERIO test strip 1 each 2 (two) times daily. Use as directed to check blood sugar twice daily.    Marland Kitchen OZEMPIC, 0.25 OR 0.5 MG/DOSE, 2 MG/1.5ML SOPN Inject 0.25 mg into the skin once a week.    . pantoprazole (PROTONIX) 40 MG tablet Take 1 tablet (40 mg total) by mouth daily. 30 tablet 0  . sacubitril-valsartan (ENTRESTO) 97-103 MG Take 1 tablet by mouth 2 (two) times daily. 180 tablet 3  . spironolactone (ALDACTONE) 25 MG tablet Take 1 tablet (25 mg total) by  mouth daily. 30 tablet 6  . warfarin (COUMADIN) 5 MG tablet Take 1.5 tablets (7.5 mg total) by mouth daily at 4 PM. 45 tablet 0   No current facility-administered medications for this encounter.    No Known Allergies    Social History   Socioeconomic History  . Marital status: Single    Spouse name: Not on file  . Number of children: 0  . Years of education: Not on file  . Highest education level: Master's degree (e.g., MA, MS, MEng, MEd, MSW, MBA)  Occupational History  . 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 Smoking status: Never Smoker  . Smokeless tobacco:  Former Neurosurgeon    Types: Snuff  Vaping Use  . Vaping Use: Never used  Substance and Sexual Activity  . Alcohol use: Yes    Alcohol/week: 30.0 standard drinks    Types: 30 Shots of liquor per week    Comment: weekly  . Drug use: Never  . Sexual activity: Not Currently    Birth control/protection: None  Other Topics Concern  . Not on file  Social History Narrative  . Not on file   Social Determinants of Health   Financial Resource Strain:   . Difficulty of Paying Living Expenses: Not on file  Food Insecurity:   . Worried About Programme researcher, broadcasting/film/video in the Last Year: Not on file  . Ran Out of Food in the Last Year: Not on file  Transportation Needs:   . Lack of Transportation (Medical): Not on file  . Lack of Transportation (Non-Medical): Not on file  Physical Activity:   . Days of Exercise per Week: Not on file  . Minutes of Exercise per Session: Not on file  Stress:   . Feeling of Stress : Not on file  Social Connections:   . Frequency of Communication with Friends and Family: Not on file  . Frequency of Social Gatherings with Friends and Family: Not on file  . Attends Religious Services: Not on file  . Active Member of Clubs or Organizations: Not on file  . Attends Banker Meetings: Not on file  . Marital Status: Not on file  Intimate Partner Violence:   . Fear of Current or Ex-Partner: Not on file  . Emotionally Abused: Not on file  . Physically Abused: Not on file  . Sexually Abused: Not on file      Family History  Problem Relation Age of Onset  . Diabetes Mellitus II Mother   . Heart failure Maternal Grandmother   . Heart disease Maternal Grandfather     Vitals:   02/24/20 0940  BP: 108/60  Pulse: 100  SpO2: 95%  Weight: (!) 182.5 kg (402 lb 6.4 oz)  Height: 6\' 3"  (1.905 m)     PHYSICAL EXAM: General: NAD, obese Neck: No JVD, no thyromegaly or thyroid nodule.  Lungs: Clear to auscultation bilaterally with normal respiratory effort. CV:  Nondisplaced PMI.  Heart regular S1/S2, no S3/S4, no murmur.  No peripheral edema.  Varicose veins. No carotid bruit.  Normal pedal pulses.  Abdomen: Soft, nontender, no hepatosplenomegaly, no distention.  Skin: Intact without lesions or rashes.  Neurologic: Alert and oriented x 3.  Psych: Normal affect. Extremities: No clubbing or cyanosis.  HEENT: Normal.   ASSESSMENT & PLAN:   1. Chronic Systolic CHF: Nonischemic cardiomyopathy.  Echo 11/28/19 was difficult but EF appeared to be 20-25%. Cause is uncertain. However, heavy ETOH may play a role as well as  HTN and untreated diabetes. Cannot rule out viral myocarditis. LHC showed no coronary disease. RHC w/low CI at 2.0. Repeat Limited Echo 12/12/19 showed slight improvement in LVEF, 30-35%. RV systolic function normal.  On exam today, he is not volume overloaded.  NYHA class II symptoms.  - He can continue to use Lasix prn.  - Stop Lasix. Change to PRN.  - Continue digoxin 0.125 daily. Check Dig level today  - Continue spironolactone 25 mg daily.  - Increase Coreg to 6.25 mg bid.  - Continue Entresto 97/103 bid. - Continue to remain abstinent from ETOH. - If EF remains <35% on repeat echo 6 months post-initial echo, will need referral to EP for ICD. ECG is narrow complex so not CRT candidate 2. Bilateral PE: diagnosed 12/13/19. Bilateral LE venous dopplers negative for PE. Limited echo w/ mildly enlarged RV but normal systolic function. Suspect triggered by sedentary lifestyle + CHF.   - Continue warfarin.  3. Type 2 diabetes: Diagnosed in 7/21 with hgbA1c 11.  - on Dapagliglozin- Metformin - followed by PCP   3. OSA: Needs CPAP titration.  4. ETOH abuse: Former heavy drinker. He reports he has quit 5. Obesity: Continue diet/exercise efforts.   F/u w/ pharmD in 3 weeks for med titration (Coreg), see me in 3 months with echo.    Marca Ancona, MD 02/24/20

## 2020-02-25 ENCOUNTER — Other Ambulatory Visit (HOSPITAL_COMMUNITY): Payer: Self-pay | Admitting: *Deleted

## 2020-02-26 ENCOUNTER — Encounter (HOSPITAL_COMMUNITY)
Admission: RE | Admit: 2020-02-26 | Discharge: 2020-02-26 | Disposition: A | Discharge status: Home / Self Care | Payer: 59 | Source: Ambulatory Visit | Attending: Internal Medicine | Admitting: Internal Medicine

## 2020-02-26 ENCOUNTER — Other Ambulatory Visit: Payer: Self-pay

## 2020-02-26 DIAGNOSIS — I5022 Chronic systolic (congestive) heart failure: Secondary | ICD-10-CM | POA: Diagnosis not present

## 2020-02-26 NOTE — Progress Notes (Signed)
Nutrition Note - Follow up   Spoke with pt today about diabetes self management. Checking CBGs 1-2 times per day. His fasting CBGs are around 130 mg/dl and his post prandial are around 170 mg/dl. His CBGs have decreased at home and are closer to target. Reviewed the benefits of carbohydrate counting and eating a consistent amount of carbohydrates across the day. Showed pt how to calculate carbohydrate servings, and distributed handouts for patient to practice. Recommended pt eat <45-60 g of carbohydrates at meals. Discussed the importance of creating a balanced meal with the addition of protein and non-starchy vegetables. Distributed recipes and snack ideas to patient to try. Additionally discussed with patient that exercise may cause blood sugar to decrease and that we may need to add in a snack before or after workout, to manage any changes. Distributed handout of snack ideas that would provide a combination of carbohydrates and protein. Pt verbalized understanding of material discussed today. Distributed RD contact information.     Andrey Campanile, MS, RDN, LDN

## 2020-02-28 ENCOUNTER — Other Ambulatory Visit: Payer: Self-pay

## 2020-02-28 ENCOUNTER — Encounter (HOSPITAL_COMMUNITY)
Admission: RE | Admit: 2020-02-28 | Discharge: 2020-02-28 | Disposition: A | Discharge status: Home / Self Care | Payer: 59 | Source: Ambulatory Visit | Attending: Internal Medicine | Admitting: Internal Medicine

## 2020-02-28 DIAGNOSIS — I5022 Chronic systolic (congestive) heart failure: Secondary | ICD-10-CM

## 2020-02-28 NOTE — Progress Notes (Signed)
Reviewed home exercise Rx with patient today. Pt walks occasionally on his treadmill at home, but not consistently. Dicussed walking 2x/week on the the treadmill for 30 minutes. We dicussed doing two 15 minute sessions per day. Pt is currently walking here in CRP2 program for 15 minutes without problem. Encouraged warm-up, cool-down, and stretching, as well as, hydration before, during and after exercise. Reviewed THRR of 74-148 and that patient should exercise between 11-13 on the RPE scale. Reviewed weather parameters for temperature and humidity if patients wants to walk outdoors. Reviewed S/S of hen to terminate exercise and when to call MD vs 911. Pt verbalized understanding of the home exercise Rx and was provided a copy.   Lorin Picket MS, ACSM-EP-C, CCRP

## 2020-02-29 ENCOUNTER — Ambulatory Visit (HOSPITAL_BASED_OUTPATIENT_CLINIC_OR_DEPARTMENT_OTHER): Payer: 59 | Attending: Cardiology | Admitting: Cardiology

## 2020-02-29 ENCOUNTER — Other Ambulatory Visit: Payer: Self-pay

## 2020-02-29 DIAGNOSIS — G4733 Obstructive sleep apnea (adult) (pediatric): Secondary | ICD-10-CM | POA: Diagnosis not present

## 2020-03-02 ENCOUNTER — Other Ambulatory Visit: Payer: Self-pay

## 2020-03-02 ENCOUNTER — Encounter (HOSPITAL_COMMUNITY)
Admission: RE | Admit: 2020-03-02 | Discharge: 2020-03-02 | Disposition: A | Discharge status: Home / Self Care | Payer: 59 | Source: Ambulatory Visit | Attending: Internal Medicine | Admitting: Internal Medicine

## 2020-03-02 DIAGNOSIS — I5022 Chronic systolic (congestive) heart failure: Secondary | ICD-10-CM | POA: Diagnosis not present

## 2020-03-04 ENCOUNTER — Encounter (HOSPITAL_COMMUNITY)
Admission: RE | Admit: 2020-03-04 | Discharge: 2020-03-04 | Disposition: A | Discharge status: Home / Self Care | Payer: 59 | Source: Ambulatory Visit | Attending: Internal Medicine | Admitting: Internal Medicine

## 2020-03-04 ENCOUNTER — Other Ambulatory Visit: Payer: Self-pay

## 2020-03-04 DIAGNOSIS — I5022 Chronic systolic (congestive) heart failure: Secondary | ICD-10-CM

## 2020-03-06 ENCOUNTER — Encounter (HOSPITAL_COMMUNITY)
Admission: RE | Admit: 2020-03-06 | Discharge: 2020-03-06 | Disposition: A | Discharge status: Home / Self Care | Payer: 59 | Source: Ambulatory Visit | Attending: Internal Medicine | Admitting: Internal Medicine

## 2020-03-06 ENCOUNTER — Other Ambulatory Visit: Payer: Self-pay

## 2020-03-06 DIAGNOSIS — I5022 Chronic systolic (congestive) heart failure: Secondary | ICD-10-CM

## 2020-03-08 NOTE — Procedures (Signed)
   Patient Name: Sean Lawson, Loper Date: 02/29/2020 Gender: Male D.O.B: [birth year] Age (years): 85 Referring Provider: Armanda Magic MD, ABSM Height (inches): 75 Interpreting Physician: Armanda Magic MD, ABSM Weight (lbs): 399 RPSGT: [place], [place] BMI: 50 MRN: [id number] Neck Size: 19.00  CLINICAL INFORMATION The patient is referred for a BiPAP titration to treat sleep apnea.  SLEEP STUDY TECHNIQUE As per the AASM Manual for the Scoring of Sleep and Associated Events v2.3 (April 2016) with a hypopnea requiring 4% desaturations.  The channels recorded and monitored were frontal, central and occipital EEG, electrooculogram (EOG), submentalis EMG (chin), nasal and oral airflow, thoracic and abdominal wall motion, anterior tibialis EMG, snore microphone, electrocardiogram, and pulse oximetry. Bilevel positive airway pressure (BPAP) was initiated at the beginning of the study and titrated to treat sleep-disordered breathing.  MEDICATIONS Medications self-administered by patient taken the night of the study : N/A  RESPIRATORY PARAMETERS Optimal IPAP Pressure (cm): 18  AHI at Optimal Pressure (/hr) 0.0 Optimal EPAP Pressure (cm)12  Overall Minimal O2 (%):82.0  Minimal O2 at Optimal Pressure (%): 89.0  SLEEP ARCHITECTURE Start Time:10:43:31 PM  Stop Time:4:51:10 AM Total Time (min):367.6  Total Sleep Time (min):275.7 Sleep Latency (min):4.9  Sleep Efficiency (%):75.0%  REM Latency (min):102.0  WASO (min):87.0 Stage N1 (%):21.6%  Stage N2 (%):54.5%  Stage N3 (%):9.1%  Stage R (%):14.9 Supine (%):96.38  Arousal Index (/hr):30.9   CARDIAC DATA The 2 lead EKG demonstrated sinus rhythm. The mean heart rate was 67.1 beats per minute. Other EKG findings include: None  LEG MOVEMENT DATA The total Periodic Limb Movements of Sleep (PLMS) were 0. The PLMS index was 0.0. A PLMS index of <15 is considered normal in adults.  IMPRESSIONS - An optimal PAP pressure was selected for  this patient ( 18/14cm of water) - Central sleep apnea was not noted during this titration (CAI = 0.2/h). - Moderete oxygen desaturations were observed during this titration (min O2 = 82.0%). - The patient snored with loud snoring volume. - Clinically significant periodic limb movements were not noted during this study. Arousals associated with PLMs were rare.  DIAGNOSIS - Obstructive Sleep Apnea (G47.33)  RECOMMENDATIONS - Trial of BiPAP therapy on 18/14 cm H2O with a Small size Resmed Nasal Pillow Mask AirFit P10 mask and heated humidification. - Avoid alcohol, sedatives and other CNS depressants that may worsen sleep apnea and disrupt normal sleep architecture. - Sleep hygiene should be reviewed to assess factors that may improve sleep quality. - Weight management and regular exercise should be initiated or continued. - Return to Sleep Center for re-evaluation after 8 weeks of therapy  [Electronically signed] 03/08/2020 08:07 PM  Armanda Magic MD, ABSM Diplomate, American Board of Sleep Medicine

## 2020-03-09 ENCOUNTER — Other Ambulatory Visit: Payer: Self-pay

## 2020-03-09 ENCOUNTER — Encounter (HOSPITAL_COMMUNITY)
Admission: RE | Admit: 2020-03-09 | Discharge: 2020-03-09 | Disposition: A | Discharge status: Home / Self Care | Payer: 59 | Source: Ambulatory Visit | Attending: Internal Medicine | Admitting: Internal Medicine

## 2020-03-09 DIAGNOSIS — I5022 Chronic systolic (congestive) heart failure: Secondary | ICD-10-CM

## 2020-03-11 ENCOUNTER — Encounter (HOSPITAL_COMMUNITY)
Admission: RE | Admit: 2020-03-11 | Discharge: 2020-03-11 | Disposition: A | Discharge status: Home / Self Care | Payer: 59 | Source: Ambulatory Visit | Attending: Internal Medicine | Admitting: Internal Medicine

## 2020-03-11 ENCOUNTER — Other Ambulatory Visit: Payer: Self-pay

## 2020-03-11 DIAGNOSIS — I5022 Chronic systolic (congestive) heart failure: Secondary | ICD-10-CM

## 2020-03-12 ENCOUNTER — Telehealth: Payer: Self-pay | Admitting: *Deleted

## 2020-03-12 NOTE — Telephone Encounter (Signed)
Left message to call back on voicemail for sleep results.

## 2020-03-12 NOTE — Telephone Encounter (Signed)
-----   Message from Quintella Reichert, MD sent at 03/08/2020  8:15 PM EDT -----  Please let patient know that they had a successful PAP titration and let DME know that orders are in EPIC.  Please set up 8 week OV with me.

## 2020-03-13 ENCOUNTER — Other Ambulatory Visit: Payer: Self-pay

## 2020-03-13 ENCOUNTER — Encounter (HOSPITAL_COMMUNITY)
Admission: RE | Admit: 2020-03-13 | Discharge: 2020-03-13 | Disposition: A | Discharge status: Home / Self Care | Payer: 59 | Source: Ambulatory Visit | Attending: Internal Medicine | Admitting: Internal Medicine

## 2020-03-13 DIAGNOSIS — I5022 Chronic systolic (congestive) heart failure: Secondary | ICD-10-CM | POA: Diagnosis not present

## 2020-03-16 ENCOUNTER — Encounter (HOSPITAL_COMMUNITY): Payer: 59

## 2020-03-17 ENCOUNTER — Ambulatory Visit (HOSPITAL_COMMUNITY)
Admission: RE | Admit: 2020-03-17 | Discharge: 2020-03-17 | Disposition: A | Discharge status: Home / Self Care | Payer: 59 | Source: Ambulatory Visit | Attending: Pharmacist | Admitting: Pharmacist

## 2020-03-17 ENCOUNTER — Other Ambulatory Visit: Payer: Self-pay

## 2020-03-17 ENCOUNTER — Encounter (HOSPITAL_COMMUNITY): Payer: Self-pay

## 2020-03-17 VITALS — BP 102/68 | HR 79 | Wt 399.4 lb

## 2020-03-17 DIAGNOSIS — G4733 Obstructive sleep apnea (adult) (pediatric): Secondary | ICD-10-CM | POA: Diagnosis not present

## 2020-03-17 DIAGNOSIS — E669 Obesity, unspecified: Secondary | ICD-10-CM | POA: Diagnosis not present

## 2020-03-17 DIAGNOSIS — Z7984 Long term (current) use of oral hypoglycemic drugs: Secondary | ICD-10-CM | POA: Diagnosis not present

## 2020-03-17 DIAGNOSIS — I110 Hypertensive heart disease with heart failure: Secondary | ICD-10-CM | POA: Diagnosis not present

## 2020-03-17 DIAGNOSIS — I2699 Other pulmonary embolism without acute cor pulmonale: Secondary | ICD-10-CM | POA: Diagnosis not present

## 2020-03-17 DIAGNOSIS — I428 Other cardiomyopathies: Secondary | ICD-10-CM | POA: Diagnosis not present

## 2020-03-17 DIAGNOSIS — I5022 Chronic systolic (congestive) heart failure: Secondary | ICD-10-CM

## 2020-03-17 DIAGNOSIS — Z79899 Other long term (current) drug therapy: Secondary | ICD-10-CM | POA: Insufficient documentation

## 2020-03-17 DIAGNOSIS — Z7901 Long term (current) use of anticoagulants: Secondary | ICD-10-CM | POA: Insufficient documentation

## 2020-03-17 DIAGNOSIS — E119 Type 2 diabetes mellitus without complications: Secondary | ICD-10-CM | POA: Insufficient documentation

## 2020-03-17 MED ORDER — CARVEDILOL 12.5 MG PO TABS
12.5000 mg | ORAL_TABLET | Freq: Two times a day (BID) | ORAL | 3 refills | Status: DC
Start: 2020-03-17 — End: 2020-05-26

## 2020-03-17 NOTE — Patient Instructions (Addendum)
It was a pleasure seeing you today!  MEDICATIONS: -We are changing your medications today -Increase carvedilol to 12.5 mg (1 tablet) twice daily. -Call if you have questions about your medications.  NEXT APPOINTMENT: Return to clinic in 2 months with Dr. Shirlee Latch.  In general, to take care of your heart failure: -Limit your fluid intake to 2 Liters (half-gallon) per day.   -Limit your salt intake to ideally 2-3 grams (2000-3000 mg) per day. -Weigh yourself daily and record, and bring that "weight diary" to your next appointment.  (Weight gain of 2-3 pounds in 1 day typically means fluid weight.) -The medications for your heart are to help your heart and help you live longer.   -Please contact us before stopping any of your heart medications.   Call the clinic at [phone] with questions or to reschedule future appointments.

## 2020-03-17 NOTE — Telephone Encounter (Signed)
Return call: Informed patient of sleep study results and patient understanding was verbalized. Patient understands her sleep study showed they had a successful PAP titration and let DME know that orders are in EPIC. Please set up 8 week OV with me.   Upon patient request DME selection is [facility]. Patient understands she/he will be contacted by [facility] to set up her/he cpap. Patient understands to call if [facility] does not contact her/he with new setup in a timely manner. Patient understands they will be called once confirmation has been received from [facility] that they have received their new machine to schedule 10 week follow up appointment.   [facility] notified of new cpap order  Please add to airview Patient was grateful for the call and thanked me.

## 2020-03-17 NOTE — Progress Notes (Signed)
Referring Physician: PCP: [name], MD HF Cardiology: Dr. Shirlee Latch  HPI:  Sean Lawson is a 34 y.o. with a history of HTN, obesity, ETOH abuse, systolic heart failure, PE and diabetes.   Recently admitted in early 7/21 with increased dyspnea and leg edema. ECHO showed reduced EF 20-25%. Diuresed with IV furosemide and transitioned to PO furosemide 80 mg daily. Overall diuresed 35 pounds. Had LHC/RHC that showed normal coronaries and elevated filling pressures, CI 2.0. Also new diagnosis of T2DM, Hgb A1c 11.4. Now on metformin + dapagliflozin. He was discharged home 12/03/19 on GDMT with plans to repeat 2D echo in 3 months.   Shortly after discharge, he was readmitted on 12/12/19 by IM for acute bilateral PE, he had been sedentary at work and at home. Was referred to ED by PCP for dyspnea and hypoxia. D-dimer elevated at 6.61. EKG showed sinus tachycardia. CT angiogram of the chest confirmed bilateral pulmonary embolism with mild right heart strain. Repeat limited echo showed RV to be mildly enlarged but systolic function normal. LVEF was 30-35%, mildly improved from previous study. BLE Dopplers negative for DVT. Given BMI of 35, not felt to be ideal candidate for DOAC. He was placed on warfarin with heparin bridge.   At last appointment with Dr. Shirlee Latch on 02/24/20, he had lost another 25 lbs compared to previous appointment. Generally feeling well.  No dyspnea walking on flat ground.  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  Occasional lightheadedness if he stands too fast. No chest pain. No orthopnea/PND.   Today he returns to HF clinic for pharmacist medication titration. At last visit with MD, carvedilol was increased to 6.25 mg BID. Overall feeling really good. Occasional dizziness/lightheadedness if he stands too fast. Fatigue only after long days. No chest pain or palpitations. Breathing is good, no SOB/DOE.  Weight at home is usually 393-397 pounds. Reports increased water intake during long football practices. He takes furosemide 80 mg PRN to minimize fluid on these days, usually 3-4x per week. No PND/Orthopnea/LEE. Appetite is good. Adheres to low salt diet. Taking all medications as prescribed. Tolerating all medications. No affordability issues.  HF Medications: Carvedilol 6.25 mg BID  Entresto 97/103 mg BID  Spironolactone 25 mg daily  Dapagliflozin/metformin 09-998 mg BID Digoxin 0.125 mg daily Furosemide 80 mg PRN for excess fluid  Has the patient been experiencing any side effects to the medications prescribed? NO  Does the patient have any problems obtaining medications due to transportation or finances?  NO, [employer]  Understanding of regimen: Excellent Understanding of indications: Excellent Potential of compliance: Excellent Patient understands to avoid NSAIDs. Patient understands to avoid decongestants.    Pertinent Lab Values 02/24/20: . Serum creatinine 1.45, BUN 24, Potassium 4.0, Sodium 134, Digoxin 0.8   Vital Signs: . Weight: 399.4 lbs (last clinic weight: 402 lbs) . Blood pressure: 102/68  . Heart rate: 79  Assessment:  1. Chronic Systolic CHF: Nonischemic cardiomyopathy.  Echo 11/28/19 was difficult but EF appeared to be 20-25%. Cause is uncertain. However, heavy ETOH may play a role as well as HTN and untreated diabetes. Cannot rule out viral myocarditis. LHC showed no coronary disease. RHC with low CI at 2.0. Repeat Limited Echo 12/12/19 showed slight improvement in LVEF, 30-35%. RV systolic function normal.   - He is not volume overloaded on exam. NYHA class II symptoms.  - Continue furosemide 80 mg PRN  -  Increase carvedilol to 12.5 mg BID - Continue Entresto97/103 mg BID - Continue spironolactone 25 mg daily. - Continue dapagliflozin 10 mg daily (component of dapagliflozin-metformin 09-998 mg BID) - Continue digoxin 0.125 mg daily.  - Continue  to remain abstinent from ETOH. - If EF remains <35% on repeat echo 6 months post-initial echo, will need referral to EP for ICD. ECG is narrow complex so not CRT candidate   2. Bilateral PE: diagnosed 12/13/19. Bilateral LE venous dopplers negative for PE. Limited echo with mildly enlarged RV but normal systolic function. Suspect triggered by sedentary lifestyle + CHF.   - Continue warfarin.   3. Type 2 diabetes: Diagnosed in 7/21 with hgbA1c 11.  - on Dapagliglozin/Metformin 09-998 mg BID - followed by PCP    4. OSA: followed by Dr. Mayford Knife  5. ETOH abuse: Former heavy drinker. He reports he has quit  6. Obesity: Continue diet/exercise efforts.    Plan: 1) Medication changes: Based on clinical presentation, vital signs and recent labs will increase carvedilol to 12.5 mg BID. 2) Follow-up with Dr. Shirlee Latch in in 10 weeks   Sean Lawson, PharmD, BCPS, [facility], CPP Heart Failure Clinic Pharmacist [phone]

## 2020-03-18 ENCOUNTER — Encounter (HOSPITAL_COMMUNITY)
Admission: RE | Admit: 2020-03-18 | Discharge: 2020-03-18 | Disposition: A | Discharge status: Home / Self Care | Payer: 59 | Source: Ambulatory Visit | Attending: Internal Medicine | Admitting: Internal Medicine

## 2020-03-18 DIAGNOSIS — I5022 Chronic systolic (congestive) heart failure: Secondary | ICD-10-CM

## 2020-03-18 NOTE — Progress Notes (Signed)
Nutrition Note - Follow up  Pt reports recent A1C 6.9% Fasting CBG 129 mg/dl today Post prandial 481 mg/dl last night Ozempic was increased to 0.5 mg. Plan to increase 1.0 mg  Meal planning going well. He is still using MyFitnessPal app to maintain carb and sodium intake WNL.  He has added fish twice a week and eating chicken more often.  He does not foresee any barriers with his diet moving forward. Will continue to monitor pt during cardiac rehab.  Andrey Campanile, MS, RDN, LDN

## 2020-03-19 NOTE — Progress Notes (Signed)
Cardiac Individual Treatment Plan  Patient Details  Name: Sean Lawson MRN: [id number] Date of Birth: [birth year] Referring Provider:     CARDIAC REHAB PHASE II ORIENTATION from 02/06/2020 in [facility] CARDIAC REHAB  Referring Provider [name]      Initial Encounter Date:    CARDIAC REHAB PHASE II ORIENTATION from 02/06/2020 in [facility] CARDIAC REHAB  Date 02/06/20      Visit Diagnosis: Chronic systolic CHF (congestive heart failure) (HCC)  Patient's Home Medications on Admission:  Current Outpatient Medications:  .  augmented betamethasone dipropionate (DIPROLENE-AF) 0.05 % cream, Apply 1 application topically daily as needed for rash., Disp: , Rfl:  .  carvedilol (COREG) 12.5 MG tablet, Take 1 tablet (12.5 mg total) by mouth 2 (two) times daily with a meal., Disp: 180 tablet, Rfl: 3 .  Dapagliflozin-metFORMIN HCl ER (XIGDUO XR) 09-998 MG TB24, Take 1 tablet by mouth 2 (two) times daily. , Disp: , Rfl:  .  digoxin (LANOXIN) 0.125 MG tablet, Take 1 tablet (0.125 mg total) by mouth daily., Disp: 30 tablet, Rfl: 6 .  furosemide (LASIX) 80 MG tablet, Take 80 mg by mouth daily as needed for fluid or edema., Disp: , Rfl:  .  magnesium oxide (MAG-OX) 400 (241.3 Mg) MG tablet, Take 1 tablet (400 mg total) by mouth daily., Disp: 30 tablet, Rfl: 6 .  ONETOUCH VERIO test strip, 1 each 2 (two) times daily. Use as directed to check blood sugar twice daily., Disp: , Rfl:  .  OZEMPIC, 0.25 OR 0.5 MG/DOSE, 2 MG/1.5ML SOPN, Inject 0.25 mg into the skin once a week., Disp: , Rfl:  .  pantoprazole (PROTONIX) 40 MG tablet, Take 1 tablet (40 mg total) by mouth daily., Disp: 30 tablet, Rfl: 0 .  sacubitril-valsartan (ENTRESTO) 97-103 MG, Take 1 tablet by mouth 2 (two) times daily., Disp: 180 tablet, Rfl: 3 .  spironolactone (ALDACTONE) 25 MG tablet, Take 1 tablet (25 mg total) by mouth daily., Disp: 30 tablet, Rfl: 6 .  warfarin (COUMADIN) 5 MG tablet, Take  1.5 tablets (7.5 mg total) by mouth daily at 4 PM., Disp: 45 tablet, Rfl: 0  Past Medical History: Past Medical History:  Diagnosis Date  . Chronic systolic CHF (congestive heart failure) (HCC) 12/13/2019  . Diabetes mellitus without complication (HCC)   . Hypertension     Tobacco Use: Social History   Tobacco Use  Smoking Status Never Smoker  Smokeless Tobacco Former Neurosurgeon  . Types: Snuff    Labs: Recent Review Flowsheet Data    Labs for ITP Cardiac and Pulmonary Rehab Latest Ref Rng & Units 11/30/2019 12/01/2019 12/02/2019 12/02/2019 12/02/2019   Cholestrol 0 - 200 mg/dL - - - - -   LDLCALC 0 - 99 mg/dL - - - - -   HDL >35 mg/dL - - - - -   Trlycerides <150 mg/dL - - - - -   Hemoglobin A1c 4.8 - 5.6 % - - - - -   HCO3 20.0 - 28.0 mmol/L - - - 42.0(H) 42.3(H)   TCO2 22 - 32 mmol/L - - - 44(H) 44(H)   O2SAT % 75.2 78.2 64.7 60.0 56.0      Capillary Blood Glucose: Lab Results  Component Value Date   GLUCAP 206 (H) 02/12/2020   GLUCAP 210 (H) 02/12/2020   GLUCAP 219 (H) 02/10/2020   GLUCAP 187 (H) 02/10/2020   GLUCAP 223 (H) 12/17/2019     Exercise Target Goals: Exercise  Program Goal: Individual exercise prescription set using results from initial 6 min walk test and THRR while considering  patient's activity barriers and safety.   Exercise Prescription Goal: Starting with aerobic activity 30 plus minutes a day, 3 days per week for initial exercise prescription. Provide home exercise prescription and guidelines that participant acknowledges understanding prior to discharge.  Activity Barriers & Risk Stratification:  Activity Barriers & Cardiac Risk Stratification - 02/06/20 1516      Activity Barriers & Cardiac Risk Stratification   Activity Barriers Deconditioning;Shortness of Breath    Cardiac Risk Stratification High           6 Minute Walk:  6 Minute Walk    Row Name 02/06/20 1515         6 Minute Walk   Phase Initial     Distance 1570 feet     Walk  Time 6 minutes     # of Rest Breaks 0     MPH 3     METS 3.9     RPE 12     Perceived Dyspnea  0     VO2 Peak 13.7     Symptoms Yes (comment)     Comments SOB +1     Resting HR 87 bpm     Resting BP 108/70     Resting Oxygen Saturation  98 %     Exercise Oxygen Saturation  during 6 min walk 94 %     Max Ex. HR 137 bpm     Max Ex. BP 128/76     2 Minute Post BP 110/72            Oxygen Initial Assessment:   Oxygen Re-Evaluation:   Oxygen Discharge (Final Oxygen Re-Evaluation):   Initial Exercise Prescription:  Initial Exercise Prescription - 02/06/20 1500      Date of Initial Exercise RX and Referring Provider   Date 02/06/20    Referring Provider [name]    Expected Discharge Date 04/03/20      NuStep   Level 2    SPM 75    Minutes 15    METs 2.5      Track   Laps 12    Minutes 15    METs 2.39      Prescription Details   Frequency (times per week) 3x    Duration Progress to 10 minutes continuous walking  at current work load and total walking time to 30-45 min      Intensity   THRR 40-80% of Max Heartrate 74-148    Ratings of Perceived Exertion 11-13    Perceived Dyspnea 0-4      Progression   Progression Continue progressive overload as per policy without signs/symptoms or physical distress.      Resistance Training   Training Prescription Yes    Weight 5lbs    Reps 10-15           Perform Capillary Blood Glucose checks as needed.  Exercise Prescription Changes:   Exercise Prescription Changes    Row Name 02/10/20 1625 02/28/20 1600 03/13/20 1625         Response to Exercise   Blood Pressure (Admit) 114/76 104/68 118/78     Blood Pressure (Exercise) 128/70 118/66 120/70     Blood Pressure (Exit) 118/68 106/68 108/78     Heart Rate (Admit) 90 bpm 88 bpm 95 bpm     Heart Rate (Exercise) 126 bpm 136 bpm 121 bpm  Heart Rate (Exit) 80 bpm 94 bpm 98 bpm     Rating of Perceived Exertion (Exercise) 11 12 12      Symptoms None  None None     Comments Pt's first day of exercise in the CRP2 program. Reviewed METS and Home exercise Reviewed Mets     Duration Progress to 30 minutes of  aerobic without signs/symptoms of physical distress Continue with 30 min of aerobic exercise without signs/symptoms of physical distress. Continue with 30 min of aerobic exercise without signs/symptoms of physical distress.     Intensity THRR unchanged THRR unchanged THRR unchanged       Progression   Progression Continue to progress workloads to maintain intensity without signs/symptoms of physical distress. -- Continue to progress workloads to maintain intensity without signs/symptoms of physical distress.     Average METs 2.6 2.8 2.8       Resistance Training   Training Prescription Yes Yes Yes     Weight 5 lbs 6 lbs 6 lbs     Reps 10-15 10-15 10-15     Time 10 Minutes 10 Minutes 10 Minutes       Interval Training   Interval Training No No No       NuStep   Level 2 3 4      SPM 80 85 85     Minutes 15 15 15      METs 2 2.4 2.5       Track   Laps 18 19 18      Minutes 15 15 15      METs 3.09 3.21 3.09       Home Exercise Plan   Plans to continue exercise at -- Home (comment) Home (comment)     Frequency -- Add 2 additional days to program exercise sessions. Add 2 additional days to program exercise sessions.     Initial Home Exercises Provided -- 02/28/20 02/28/20            Exercise Comments:   Exercise Comments    Row Name 02/10/20 1630 02/28/20 1622 03/13/20 1625       Exercise Comments Pt's first day of exercise in the CRP2 program. Pt tolerated exercise well. Reviewed home exercise with patient today. Pt verbalized understanding of the Rx and was provided copy. Reviewed METS. Pt is making progress.            Exercise Goals and Review:   Exercise Goals    Row Name 02/06/20 1517             Exercise Goals   Increase Physical Activity Yes       Intervention Provide advice, education, support and  counseling about physical activity/exercise needs.;Develop an individualized exercise prescription for aerobic and resistive training based on initial evaluation findings, risk stratification, comorbidities and participant's personal goals.       Expected Outcomes Short Term: Attend rehab on a regular basis to increase amount of physical activity.;Long Term: Add in home exercise to make exercise part of routine and to increase amount of physical activity.;Long Term: Exercising regularly at least 3-5 days a week.       Increase Strength and Stamina Yes       Intervention Provide advice, education, support and counseling about physical activity/exercise needs.;Develop an individualized exercise prescription for aerobic and resistive training based on initial evaluation findings, risk stratification, comorbidities and participant's personal goals.       Expected Outcomes Short Term: Increase workloads from initial exercise prescription for resistance, speed,  and METs.;Short Term: Perform resistance training exercises routinely during rehab and add in resistance training at home;Long Term: Improve cardiorespiratory fitness, muscular endurance and strength as measured by increased METs and functional capacity ( )       Able to understand and use rate of perceived exertion (RPE) scale Yes       Intervention Provide education and explanation on how to use RPE scale       Expected Outcomes Short Term: Able to use RPE daily in rehab to express subjective intensity level;Long Term:  Able to use RPE to guide intensity level when exercising independently       Knowledge and understanding of Target Heart Rate Range (THRR) Yes       Intervention Provide education and explanation of THRR including how the numbers were predicted and where they are located for reference       Expected Outcomes Short Term: Able to state/look up THRR;Long Term: Able to use THRR to govern intensity when exercising independently;Short Term:  Able to use daily as guideline for intensity in rehab       Able to check pulse independently Yes       Intervention Provide education and demonstration on how to check pulse in carotid and radial arteries.;Review the importance of being able to check your own pulse for safety during independent exercise       Expected Outcomes Short Term: Able to explain why pulse checking is important during independent exercise;Long Term: Able to check pulse independently and accurately       Understanding of Exercise Prescription Yes       Intervention Provide education, explanation, and written materials on patient's individual exercise prescription       Expected Outcomes Short Term: Able to explain program exercise prescription;Long Term: Able to explain home exercise prescription to exercise independently              Exercise Goals Re-Evaluation :  Exercise Goals Re-Evaluation    Row Name 02/10/20 1628 02/28/20 1620           Exercise Goal Re-Evaluation   Exercise Goals Review Increase Physical Activity;Increase Strength and Stamina;Able to understand and use rate of perceived exertion (RPE) scale;Knowledge and understanding of Target Heart Rate Range (THRR);Able to check pulse independently;Understanding of Exercise Prescription Increase Physical Activity;Increase Strength and Stamina;Able to understand and use rate of perceived exertion (RPE) scale;Knowledge and understanding of Target Heart Rate Range (THRR);Able to check pulse independently;Understanding of Exercise Prescription      Comments Pt's first day of exercise in the CRP2 program. Pt tolerated exercise Rx well and understands exercise RX, RPE scale and THRR. Reviewed METs and home exercise Rx. Pt has treadmill at home which he uses from time to time. Encouraged to walk on the treadmill 2-3x/wk for 30 minutes. Pt verbalized understnading of the home exercise Rx and was provided copy.      Expected Outcomes Will continue to monitor patient  and progress as tolerated. Pt will walk on his treadmill at home 2-3x/week.              Discharge Exercise Prescription (Final Exercise Prescription Changes):  Exercise Prescription Changes - 03/13/20 1625      Response to Exercise   Blood Pressure (Admit) 118/78    Blood Pressure (Exercise) 120/70    Blood Pressure (Exit) 108/78    Heart Rate (Admit) 95 bpm    Heart Rate (Exercise) 121 bpm    Heart Rate (Exit) 98 bpm  Rating of Perceived Exertion (Exercise) 12    Symptoms None    Comments Reviewed Mets    Duration Continue with 30 min of aerobic exercise without signs/symptoms of physical distress.    Intensity THRR unchanged      Progression   Progression Continue to progress workloads to maintain intensity without signs/symptoms of physical distress.    Average METs 2.8      Resistance Training   Training Prescription Yes    Weight 6 lbs    Reps 10-15    Time 10 Minutes      Interval Training   Interval Training No      NuStep   Level 4    SPM 85    Minutes 15    METs 2.5      Track   Laps 18    Minutes 15    METs 3.09      Home Exercise Plan   Plans to continue exercise at Home (comment)    Frequency Add 2 additional days to program exercise sessions.    Initial Home Exercises Provided 02/28/20           Nutrition:  Target Goals: Understanding of nutrition guidelines, daily intake of sodium 1500mg , cholesterol 200mg , calories 30% from fat and 7% or less from saturated fats, daily to have 5 or more servings of fruits and vegetables.  Biometrics:  Pre Biometrics - 02/06/20 1517      Pre Biometrics   Height 6' 2.25" (1.886 m)    Weight 187.5 kg    Waist Circumference 59 inches    Hip Circumference 58 inches    Waist to Hip Ratio 1.02 %    BMI (Calculated) 52.71    Triceps Skinfold 34 mm    % Body Fat 47.3 %    Grip Strength 45 kg    Flexibility 0 in    Single Leg Stand 5.81 seconds            Nutrition Therapy Plan and Nutrition  Goals:  Nutrition Therapy & Goals - 02/12/20 1541      Nutrition Therapy   Diet Low sodium/mod carbs    Drug/Food Interactions Coumadin/Vit K      Personal Nutrition Goals   Nutrition Goal Improved blood glucose control as evidenced by pt's A1c trending from 11.4 toward less than 7.0.    Personal Goal #2 CBG concentrations in the normal range or as close to normal as is safely possible.    Personal Goal #3 Pt to identify food quantities necessary to achieve weight loss of 6-24 lb at graduation from cardiac rehab      Intervention Plan   Intervention Prescribe, educate and counsel regarding individualized specific dietary modifications aiming towards targeted core components such as weight, hypertension, lipid management, diabetes, heart failure and other comorbidities.    Expected Outcomes Short Term Goal: Understand basic principles of dietary content, such as calories, fat, sodium, cholesterol and nutrients.           Nutrition Assessments:  Nutrition Assessments - 02/11/20 1037      MEDFICTS Scores   Pre Score 42           Nutrition Goals Re-Evaluation:  Nutrition Goals Re-Evaluation    Row Name 02/12/20 1542 02/18/20 0905 03/18/20 1325         Goals   Current Weight 413 lb (187.3 kg) 409 lb 9.8 oz (185.8 kg) 400 lb 9.2 oz (181.7 kg)     Nutrition Goal Improved  blood glucose control as evidenced by pt's A1c trending from 11.4 toward less than 7.0. -- Improved blood glucose control as evidenced by pt's A1c trending from 11.4 toward less than 7.0.     Comment -- -- Weight loss 9 lbs in 4 weeks       Personal Goal #2 Re-Evaluation   Personal Goal #2 CBG concentrations in the normal range or as close to normal as is safely possible. -- CBG concentrations in the normal range or as close to normal as is safely possible.       Personal Goal #3 Re-Evaluation   Personal Goal #3 Pt to identify food quantities necessary to achieve weight loss of 6-24 lb at graduation from cardiac  rehab -- Pt to identify food quantities necessary to achieve weight loss of 6-24 lb at graduation from cardiac rehab            Nutrition Goals Discharge (Final Nutrition Goals Re-Evaluation):  Nutrition Goals Re-Evaluation - 03/18/20 1325      Goals   Current Weight 400 lb 9.2 oz (181.7 kg)    Nutrition Goal Improved blood glucose control as evidenced by pt's A1c trending from 11.4 toward less than 7.0.    Comment Weight loss 9 lbs in 4 weeks      Personal Goal #2 Re-Evaluation   Personal Goal #2 CBG concentrations in the normal range or as close to normal as is safely possible.      Personal Goal #3 Re-Evaluation   Personal Goal #3 Pt to identify food quantities necessary to achieve weight loss of 6-24 lb at graduation from cardiac rehab           Psychosocial: Target Goals: Acknowledge presence or absence of significant depression and/or stress, maximize coping skills, provide positive support system. Participant is able to verbalize types and ability to use techniques and skills needed for reducing stress and depression.  Initial Review & Psychosocial Screening:  Initial Psych Review & Screening - 02/06/20 1439      Initial Review   Current issues with Current Stress Concerns    Source of Stress Concerns Chronic Illness    Comments Newly diagnosed heart failure diagnosis      Family Dynamics   Good Support System? Yes   Jill Alexanders lives alone. Jill Alexanders has his parents and brother for support who live nearby     Barriers   Psychosocial barriers to participate in program The patient should benefit from training in stress management and relaxation.      Screening Interventions   Interventions Encouraged to exercise    Expected Outcomes Long Term Goal: Stressors or current issues are controlled or eliminated.           Quality of Life Scores:  Quality of Life - 02/06/20 1520      Quality of Life   Select Quality of Life      Quality of Life Scores   Health/Function  Pre 20.6 %    Socioeconomic Pre 23.63 %    Psych/Spiritual Pre 24.86 %    Family Pre 28.8 %    GLOBAL Pre 23.31 %          Scores of 19 and below usually indicate a poorer quality of life in these areas.  A difference of  2-3 points is a clinically meaningful difference.  A difference of 2-3 points in the total score of the Quality of Life Index has been associated with significant improvement in overall quality of life, self-image,  physical symptoms, and general health in studies assessing change in quality of life.  PHQ-9: Recent Review Flowsheet Data    Depression screen [facility] 2/9 02/06/2020 02/06/2020   Decreased Interest 0 0   Down, Depressed, Hopeless 0 0   PHQ - 2 Score 0 0     Interpretation of Total Score  Total Score Depression Severity:  1-4 = Minimal depression, 5-9 = Mild depression, 10-14 = Moderate depression, 15-19 = Moderately severe depression, 20-27 = Severe depression   Psychosocial Evaluation and Intervention:   Psychosocial Re-Evaluation:  Psychosocial Re-Evaluation    Row Name 02/18/20 1527 03/19/20 1626           Psychosocial Re-Evaluation   Current issues with Current Stress Concerns Current Stress Concerns      Comments Jill Alexanders has not voiced any increased stressors or concerns. Jill Alexanders has not voiced any increased stressors or concerns.      Expected Outcomes Patient will have decreased stressors upon completion of phase 2 cardiac rehab Patient will have decreased stressors upon completion of phase 2 cardiac rehab      Interventions Encouraged to attend Cardiac Rehabilitation for the exercise;Stress management education Encouraged to attend Cardiac Rehabilitation for the exercise;Stress management education      Continue Psychosocial Services  No Follow up required No Follow up required      Comments Newly diagnosed heart failure diagnosis --        Initial Review   Source of Stress Concerns Chronic Illness Chronic Illness             Psychosocial  Discharge (Final Psychosocial Re-Evaluation):  Psychosocial Re-Evaluation - 03/19/20 1626      Psychosocial Re-Evaluation   Current issues with Current Stress Concerns    Comments Jill Alexanders has not voiced any increased stressors or concerns.    Expected Outcomes Patient will have decreased stressors upon completion of phase 2 cardiac rehab    Interventions Encouraged to attend Cardiac Rehabilitation for the exercise;Stress management education    Continue Psychosocial Services  No Follow up required      Initial Review   Source of Stress Concerns Chronic Illness           Vocational Rehabilitation: Provide vocational rehab assistance to qualifying candidates.   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 Participant will state understanding/return demonstration of topics presented.  Learning Barriers/Preferences:  Learning Barriers/Preferences - 02/06/20 1522      Learning Barriers/Preferences   Learning Barriers None    Learning Preferences Written Material;Skilled Demonstration;Individual Instruction           Education Topics: Hypertension, Hypertension Reduction -Define heart disease and high blood pressure. Discus how high blood pressure affects the body and ways to reduce high blood pressure.   Exercise and Your Heart -Discuss why it is important to exercise, the FITT principles of exercise, normal and abnormal responses to exercise, and how to exercise safely.   Angina -Discuss definition of angina, causes of angina, treatment of angina, and how to decrease risk of having angina.   Cardiac Medications -Review what the  following cardiac medications are used for, how they  affect the body, and side effects that may occur when taking the medications.  Medications include Aspirin, Beta blockers, calcium channel blockers, ACE Inhibitors, angiotensin receptor blockers, diuretics, digoxin, and antihyperlipidemics.   Congestive Heart Failure -Discuss the definition of CHF, how to live with CHF, the signs and symptoms of CHF, and how keep track of weight and sodium intake.   Heart Disease and Intimacy -Discus the effect sexual activity has on the heart, how changes occur during intimacy as we age, and safety during sexual activity.   Smoking Cessation / COPD -Discuss different methods to quit smoking, the health benefits of quitting smoking, and the definition of COPD.   Nutrition I: Fats -Discuss the types of cholesterol, what cholesterol does to the heart, and how cholesterol levels can be controlled.   Nutrition II: Labels -Discuss the different components of food labels and how to read food label   Heart Parts/Heart Disease and PAD -Discuss the anatomy of the heart, the pathway of blood circulation through the heart, and these are affected by heart disease.   Stress I: Signs and Symptoms -Discuss the causes of stress, how stress may lead to anxiety and depression, and ways to limit stress.   Stress II: Relaxation -Discuss different types of relaxation techniques to limit stress.   Warning Signs of Stroke / TIA -Discuss definition of a stroke, what the signs and symptoms are of a stroke, and how to identify when someone is having stroke.   Knowledge Questionnaire Score:  Knowledge Questionnaire Score - 02/06/20 1520      Knowledge Questionnaire Score   Pre Score 21/24           Core Components/Risk Factors/Patient Goals at Admission:  Personal Goals and Risk Factors at Admission - 02/06/20 1521      Core Components/Risk Factors/Patient Goals on Admission    Weight Management  Yes;Obesity;Weight Loss    Intervention Weight Management: Develop a combined nutrition and exercise program designed to reach desired caloric intake, while maintaining appropriate intake of nutrient and fiber, sodium and fats, and appropriate energy expenditure required for the weight goal.;Weight Management: Provide education and appropriate resources to help participant work on and attain dietary goals.;Weight Management/Obesity: Establish reasonable short term and long term weight goals.;Obesity: Provide education and appropriate resources to help participant work on and attain dietary goals.    Admit Weight 413 lb 5.8 oz (187.5 kg)    Goal Weight: Long Term 300 lb (136.1 kg)    Expected Outcomes Short Term: Continue to assess and modify interventions until short term weight is achieved;Long Term: Adherence to nutrition and physical activity/exercise program aimed toward attainment of established weight goal;Weight Loss: Understanding of general recommendations for a balanced deficit meal plan, which promotes 1-2 lb weight loss per week and includes a negative energy balance of [phone] kcal/d;Understanding recommendations for meals to include 15-35% energy as protein, 25-35% energy from fat, 35-60% energy from carbohydrates, less than 200mg  of dietary cholesterol, 20-35 gm of total fiber daily;Understanding of distribution of calorie intake throughout the day with the consumption of 4-5 meals/snacks    Diabetes Yes    Intervention Provide education about signs/symptoms and action to take for hypo/hyperglycemia.;Provide education about proper nutrition, including hydration, and aerobic/resistive exercise prescription along with prescribed medications to achieve blood glucose in normal ranges: Fasting glucose 65-99 mg/dL    Expected Outcomes Short Term: Participant verbalizes understanding of the signs/symptoms and immediate care of hyper/hypoglycemia, proper foot care and importance of medication,  aerobic/resistive exercise and  nutrition plan for blood glucose control.;Long Term: Attainment of HbA1C < 7%.    Heart Failure Yes    Intervention Provide a combined exercise and nutrition program that is supplemented with education, support and counseling about heart failure. Directed toward relieving symptoms such as shortness of breath, decreased exercise tolerance, and extremity edema.    Expected Outcomes Improve functional capacity of life;Short term: Attendance in program 2-3 days a week with increased exercise capacity. Reported lower sodium intake. Reported increased fruit and vegetable intake. Reports medication compliance.;Short term: Daily weights obtained and reported for increase. Utilizing diuretic protocols set by physician.;Long term: Adoption of self-care skills and reduction of barriers for early signs and symptoms recognition and intervention leading to self-care maintenance.    Hypertension Yes    Intervention Provide education on lifestyle modifcations including regular physical activity/exercise, weight management, moderate sodium restriction and increased consumption of fresh fruit, vegetables, and low fat dairy, alcohol moderation, and smoking cessation.;Monitor prescription use compliance.    Expected Outcomes Short Term: Continued assessment and intervention until BP is < 140/58mm HG in hypertensive participants. < 130/35mm HG in hypertensive participants with diabetes, heart failure or chronic kidney disease.;Long Term: Maintenance of blood pressure at goal levels.    Lipids Yes    Intervention Provide education and support for participant on nutrition & aerobic/resistive exercise along with prescribed medications to achieve LDL 70mg , HDL >40mg .    Expected Outcomes Short Term: Participant states understanding of desired cholesterol values and is compliant with medications prescribed. Participant is following exercise prescription and nutrition guidelines.;Long Term: Cholesterol  controlled with medications as prescribed, with individualized exercise RX and with personalized nutrition plan. Value goals: LDL < 70mg , HDL > 40 mg.           Core Components/Risk Factors/Patient Goals Review:   Goals and Risk Factor Review    Row Name 02/11/20 1030 02/18/20 1528 03/19/20 1626         Core Components/Risk Factors/Patient Goals Review   Personal Goals Review Weight Management/Obesity;Lipids;Stress;Heart Failure;Hypertension;Diabetes Weight Management/Obesity;Lipids;Stress;Heart Failure;Hypertension;Diabetes Weight Management/Obesity;Lipids;Stress;Heart Failure;Hypertension;Diabetes     Review Jill Alexanders started cardiac rehab  on 02/11/20. Jill Alexanders did well with exercise. Vital signs and CBG's were stable. Jill Alexanders continues to do well with exercise. Justin's vital signs and CBG's have been stable. Jill Alexanders has lost 1.7kg so far. Jill Alexanders continues to do well with exercise. Justin's vital signs and CBG's have been stable. Jill Alexanders has lost 5.8 kg so far!     Expected Outcomes Jill Alexanders will continue to particiapte in phase 2 cardiac rehab for exercise, nutrition and lifestyle modifications Jill Alexanders will continue to particiapte in phase 2 cardiac rehab for exercise, nutrition and lifestyle modifications Jill Alexanders will continue to particiapte in phase 2 cardiac rehab for exercise, nutrition and lifestyle modifications            Core Components/Risk Factors/Patient Goals at Discharge (Final Review):   Goals and Risk Factor Review - 03/19/20 1626      Core Components/Risk Factors/Patient Goals Review   Personal Goals Review Weight Management/Obesity;Lipids;Stress;Heart Failure;Hypertension;Diabetes    Review Jill Alexanders continues to do well with exercise. Justin's vital signs and CBG's have been stable. Jill Alexanders has lost 5.8 kg so far!    Expected Outcomes Jill Alexanders will continue to particiapte in phase 2 cardiac rehab for exercise, nutrition and lifestyle modifications           ITP Comments:  ITP  Comments    Row Name 02/06/20 1438 02/11/20 1029 02/18/20 1523 03/19/20 1624  ITP Comments Dr. Armanda Magic MD, Medical Director 30 Day ITP Review. Jill Alexanders started exercise on 02/10/20 and did well with exercise 30 Day ITP Review. Jill Alexanders is off to a good start to exercise. Justin's vital signs have been stable. 30 Day ITP Review. Jill Alexanders has good attendance and participation in phase 2 cardiac rehab.           Comments: See ITP comments. Jill Alexanders continues to do well with exercise and weight loss.Gladstone Lighter, RN,BSN 03/19/2020 4:30 PM

## 2020-03-20 ENCOUNTER — Encounter (HOSPITAL_COMMUNITY)
Admission: RE | Admit: 2020-03-20 | Discharge: 2020-03-20 | Disposition: A | Discharge status: Home / Self Care | Payer: 59 | Source: Ambulatory Visit | Attending: Internal Medicine | Admitting: Internal Medicine

## 2020-03-20 ENCOUNTER — Other Ambulatory Visit: Payer: Self-pay

## 2020-03-20 DIAGNOSIS — I5022 Chronic systolic (congestive) heart failure: Secondary | ICD-10-CM | POA: Diagnosis not present

## 2020-03-23 ENCOUNTER — Encounter (HOSPITAL_COMMUNITY): Payer: 59

## 2020-03-25 ENCOUNTER — Other Ambulatory Visit: Payer: Self-pay

## 2020-03-25 ENCOUNTER — Encounter (HOSPITAL_COMMUNITY)
Admission: RE | Admit: 2020-03-25 | Discharge: 2020-03-25 | Disposition: A | Discharge status: Home / Self Care | Payer: 59 | Source: Ambulatory Visit | Attending: Internal Medicine | Admitting: Internal Medicine

## 2020-03-25 DIAGNOSIS — I5022 Chronic systolic (congestive) heart failure: Secondary | ICD-10-CM | POA: Insufficient documentation

## 2020-03-27 ENCOUNTER — Encounter (HOSPITAL_COMMUNITY)
Admission: RE | Admit: 2020-03-27 | Discharge: 2020-03-27 | Disposition: A | Discharge status: Home / Self Care | Payer: 59 | Source: Ambulatory Visit | Attending: Internal Medicine | Admitting: Internal Medicine

## 2020-03-27 ENCOUNTER — Other Ambulatory Visit: Payer: Self-pay

## 2020-03-27 DIAGNOSIS — I5022 Chronic systolic (congestive) heart failure: Secondary | ICD-10-CM

## 2020-03-30 ENCOUNTER — Encounter (HOSPITAL_COMMUNITY)
Admission: RE | Admit: 2020-03-30 | Discharge: 2020-03-30 | Disposition: A | Discharge status: Home / Self Care | Payer: 59 | Source: Ambulatory Visit | Attending: Internal Medicine | Admitting: Internal Medicine

## 2020-03-30 ENCOUNTER — Other Ambulatory Visit: Payer: Self-pay

## 2020-03-30 DIAGNOSIS — I5022 Chronic systolic (congestive) heart failure: Secondary | ICD-10-CM

## 2020-04-01 ENCOUNTER — Other Ambulatory Visit: Payer: Self-pay

## 2020-04-01 ENCOUNTER — Encounter (HOSPITAL_COMMUNITY)
Admission: RE | Admit: 2020-04-01 | Discharge: 2020-04-01 | Disposition: A | Discharge status: Home / Self Care | Payer: 59 | Source: Ambulatory Visit | Attending: Internal Medicine | Admitting: Internal Medicine

## 2020-04-01 DIAGNOSIS — I5022 Chronic systolic (congestive) heart failure: Secondary | ICD-10-CM

## 2020-04-03 ENCOUNTER — Encounter (HOSPITAL_COMMUNITY)
Admission: RE | Admit: 2020-04-03 | Discharge: 2020-04-03 | Disposition: A | Discharge status: Home / Self Care | Payer: 59 | Source: Ambulatory Visit | Attending: Internal Medicine | Admitting: Internal Medicine

## 2020-04-03 ENCOUNTER — Other Ambulatory Visit: Payer: Self-pay

## 2020-04-03 DIAGNOSIS — I5022 Chronic systolic (congestive) heart failure: Secondary | ICD-10-CM | POA: Diagnosis not present

## 2020-04-03 NOTE — Progress Notes (Signed)
Discharge Progress Report  Patient Details  Name: Sean Lawson MRN: [id number] Date of Birth: 1985/08/04 Referring Provider:     CARDIAC REHAB PHASE II ORIENTATION from 02/06/2020 in [facility]ARDI[facility]  Referring Provider Sean Ancona MD       Number of Visits: 22  Reason for Discharge:  Patient reached a stable level of exercise. Patient independent in their exercise. Patient has met program and personal goals.  Smoking History:  Social History   Tobacco Use  Smoking Status Never Smoker  Smokeless Tobacco Former Neurosurgeon  . Types: Snuff    Diagnosis:  Chronic systolic CHF (congestive heart failure) (HCC)  ADL [facility]:   Initial Exercise Prescription:  Initial Exercise Prescription - 02/06/20 1500      Date of Initial Exercise RX and Referring Provider   Date 02/06/20    Referring Provider Sean Ancona MD    Expected Discharge Date 04/03/20      NuStep   Level 2    SPM 75    Minutes 15    METs 2.5      Track   Laps 12    Minutes 15    METs 2.39      Prescription Details   Frequency (times per week) 3x    Duration Progress to 10 minutes continuous walking  at current work load and total walking time to 30-45 min      Intensity   THRR 40-80% of Max Heartrate 74-148    Ratings of Perceived Exertion 11-13    Perceived Dyspnea 0-4      Progression   Progression Continue progressive overload as per policy without signs/symptoms or physical distress.      Resistance Training   Training Prescription Yes    Weight 5lbs    Reps 10-15           Discharge Exercise Prescription (Final Exercise Prescription Changes):  Exercise Prescription Changes - 04/03/20 1630      Response to Exercise   Blood Pressure (Admit) 92/65    Blood Pressure (Exercise) 108/64    Blood Pressure (Exit) 133/81    Heart Rate (Admit) 94 bpm    Heart Rate (Exercise) 121 bpm    Heart Rate (Exit) 91 bpm    Rating of Perceived Exertion (Exercise) 11     Symptoms None    Comments Pt graduated from the CRP2 program    Duration Continue with 30 min of aerobic exercise without signs/symptoms of physical distress.    Intensity THRR unchanged      Progression   Progression Continue to progress workloads to maintain intensity without signs/symptoms of physical distress.    Average METs 3      Resistance Training   Training Prescription Yes    Weight 6 lbs    Reps 10-15    Time 10 Minutes      Interval Training   Interval Training No      NuStep   Level 4    SPM 95    Minutes 15    METs 2.8      Track   Laps 18    Minutes 15    METs 3.09      Home Exercise Plan   Plans to continue exercise at Home (comment)    Frequency Add 2 additional days to program exercise sessions.    Initial Home Exercises Provided 02/28/20           Functional Capacity:  6 Minute Walk  [place] Name 02/06/20 1515 03/27/20 1504       6 Minute Walk   Phase Initial Discharge    Distance 1570 feet 1800 feet    Distance % Change -- 14.65 %    Distance Feet Change -- 230 ft    Walk Time 6 minutes 6 minutes    # of Rest Breaks 0 0    MPH 3 3.4    METS 3.9 4.1    RPE 12 11    Perceived Dyspnea  0 0    VO2 Peak 13.7 14.5    Symptoms Yes (comment) No    Comments SOB +1 --    Resting HR 87 bpm 94 bpm    Resting BP 108/70 92/62    Resting Oxygen Saturation  98 % 97 %    Exercise Oxygen Saturation  during 6 min walk 94 % 94 %    Max Ex. HR 137 bpm 105 bpm    Max Ex. BP 128/76 120/60    2 Minute Post BP 110/72 --           Psychological, QOL, Others - Outcomes: PHQ 2/9: Depression screen [facility] 2/9 04/03/2020 02/06/2020 02/06/2020  Decreased Interest 0 0 0  Down, Depressed, Hopeless 0 0 0  PHQ - 2 Score 0 0 0    Quality of Life:  Quality of Life - 03/30/20 1630      Quality of Life Scores   Health/Function Post 22.33 %    Socioeconomic Post 24.79 %    Psych/Spiritual Post 23.07 %    Family Post 24 %    GLOBAL Post 23.21 %            Personal Goals: Goals established at orientation with interventions provided to work toward goal.  Personal Goals and Risk Factors at Admission - 02/06/20 1521      Core Components/Risk Factors/Patient Goals on Admission    Weight Management Yes;Obesity;Weight Loss    Intervention Weight Management: Develop a combined nutrition and exercise program designed to reach desired caloric intake, while maintaining appropriate intake of nutrient and fiber, sodium and fats, and appropriate energy expenditure required for the weight goal.;Weight Management: Provide education and appropriate resources to help participant work on and attain dietary goals.;Weight Management/Obesity: Establish reasonable short term and long term weight goals.;Obesity: Provide education and appropriate resources to help participant work on and attain dietary goals.    Admit Weight 413 lb 5.8 oz (187.5 kg)    Goal Weight: Long Term 300 lb (136.1 kg)    Expected Outcomes Short Term: Continue to assess and modify interventions until short term weight is achieved;Long Term: Adherence to nutrition and physical activity/exercise program aimed toward attainment of established weight goal;Weight Loss: Understanding of general recommendations for a balanced deficit meal plan, which promotes 1-2 lb weight loss per week and includes a negative energy balance of [phone] kcal/d;Understanding recommendations for meals to include 15-35% energy as protein, 25-35% energy from fat, 35-60% energy from carbohydrates, less than $RemoveB'200mg'KDCcYCqf$  of dietary cholesterol, 20-35 gm of total fiber daily;Understanding of distribution of calorie intake throughout the day with the consumption of 4-5 meals/snacks    Diabetes Yes    Intervention Provide education about signs/symptoms and action to take for hypo/hyperglycemia.;Provide education about proper nutrition, including hydration, and aerobic/resistive exercise prescription along with prescribed medications to  achieve blood glucose in normal ranges: Fasting glucose 65-99 mg/dL    Expected Outcomes Short Term: Participant verbalizes understanding of the signs/symptoms and immediate care  of hyper/hypoglycemia, proper foot care and importance of medication, aerobic/resistive exercise and nutrition plan for blood glucose control.;Long Term: Attainment of HbA1C < 7%.    Heart Failure Yes    Intervention Provide a combined exercise and nutrition program that is supplemented with education, support and counseling about heart failure. Directed toward relieving symptoms such as shortness of breath, decreased exercise tolerance, and extremity edema.    Expected Outcomes Improve functional capacity of life;Short term: Attendance in program 2-3 days a week with increased exercise capacity. Reported lower sodium intake. Reported increased fruit and vegetable intake. Reports medication compliance.;Short term: Daily weights obtained and reported for increase. Utilizing diuretic protocols set by physician.;Long term: Adoption of self-care skills and reduction of barriers for early signs and symptoms recognition and intervention leading to self-care maintenance.    Hypertension Yes    Intervention Provide education on lifestyle modifcations including regular physical activity/exercise, weight management, moderate sodium restriction and increased consumption of fresh fruit, vegetables, and low fat dairy, alcohol moderation, and smoking cessation.;Monitor prescription use compliance.    Expected Outcomes Short Term: Continued assessment and intervention until BP is < 140/57mm HG in hypertensive participants. < 130/1mm HG in hypertensive participants with diabetes, heart failure or chronic kidney disease.;Long Term: Maintenance of blood pressure at goal levels.    Lipids Yes    Intervention Provide education and support for participant on nutrition & aerobic/resistive exercise along with prescribed medications to achieve LDL '70mg'$ ,  HDL >$Rem'40mg'oTQk$ .    Expected Outcomes Short Term: Participant states understanding of desired cholesterol values and is compliant with medications prescribed. Participant is following exercise prescription and nutrition guidelines.;Long Term: Cholesterol controlled with medications as prescribed, with individualized exercise RX and with personalized nutrition plan. Value goals: LDL < $Rem'70mg'xxKA$ , HDL > 40 mg.            Personal Goals Discharge:  Goals and Risk Factor Review    Row Name 02/11/20 1030 02/18/20 1528 03/19/20 1626 04/21/20 1152       Core Components/Risk Factors/Patient Goals Review   Personal Goals Review Weight Management/Obesity;Lipids;Stress;Heart Failure;Hypertension;Diabetes Weight Management/Obesity;Lipids;Stress;Heart Failure;Hypertension;Diabetes Weight Management/Obesity;Lipids;Stress;Heart Failure;Hypertension;Diabetes Weight Management/Obesity;Lipids;Stress;Heart Failure;Hypertension;Diabetes    Review Sean Lawson started cardiac rehab  on 02/11/20. Sean Lawson did well with exercise. Vital signs and CBG's were stable. Sean Lawson continues to do well with exercise. Justin's vital signs and CBG's have been stable. Sean Lawson has lost 1.7kg so far. Sean Lawson continues to do well with exercise. Justin's vital signs and CBG's have been stable. Sean Lawson has lost 5.8 kg so far! Sean Lawson did well with exercise at cardiac rehab. Justin's vital signs were stable. Sean Lawson lost 8.6 kg while in the program.    Expected Outcomes Sean Lawson will continue to particiapte in phase 2 cardiac rehab for exercise, nutrition and lifestyle modifications Sean Lawson will continue to particiapte in phase 2 cardiac rehab for exercise, nutrition and lifestyle modifications Sean Lawson will continue to particiapte in phase 2 cardiac rehab for exercise, nutrition and lifestyle modifications Sean Lawson will continue with exercise, weight loss, nutrition and life style modifications upon completion of phase 2 cardiac rehab           Exercise Goals and  Review:  Exercise Goals    Row Name 02/06/20 1517             Exercise Goals   Increase Physical Activity Yes       Intervention Provide advice, education, support and counseling about physical activity/exercise needs.;Develop an individualized exercise prescription for aerobic and resistive training based on initial evaluation findings,  risk stratification, comorbidities and participant's personal goals.       Expected Outcomes Short Term: Attend rehab on a regular basis to increase amount of physical activity.;Long Term: Add in home exercise to make exercise part of routine and to increase amount of physical activity.;Long Term: Exercising regularly at least 3-5 days a week.       Increase Strength and Stamina Yes       Intervention Provide advice, education, support and counseling about physical activity/exercise needs.;Develop an individualized exercise prescription for aerobic and resistive training based on initial evaluation findings, risk stratification, comorbidities and participant's personal goals.       Expected Outcomes Short Term: Increase workloads from initial exercise prescription for resistance, speed, and METs.;Short Term: Perform resistance training exercises routinely during rehab and add in resistance training at home;Long Term: Improve cardiorespiratory fitness, muscular endurance and strength as measured by increased METs and functional capacity (6MWT)       Able to understand and use rate of perceived exertion (RPE) scale Yes       Intervention Provide education and explanation on how to use RPE scale       Expected Outcomes Short Term: Able to use RPE daily in rehab to express subjective intensity level;Long Term:  Able to use RPE to guide intensity level when exercising independently       Knowledge and understanding of Target Heart Rate Range (THRR) Yes       Intervention Provide education and explanation of THRR including how the numbers were predicted and where they  are located for reference       Expected Outcomes Short Term: Able to state/look up THRR;Long Term: Able to use THRR to govern intensity when exercising independently;Short Term: Able to use daily as guideline for intensity in rehab       Able to check pulse independently Yes       Intervention Provide education and demonstration on how to check pulse in carotid and radial arteries.;Review the importance of being able to check your own pulse for safety during independent exercise       Expected Outcomes Short Term: Able to explain why pulse checking is important during independent exercise;Long Term: Able to check pulse independently and accurately       Understanding of Exercise Prescription Yes       Intervention Provide education, explanation, and written materials on patient's individual exercise prescription       Expected Outcomes Short Term: Able to explain program exercise prescription;Long Term: Able to explain home exercise prescription to exercise independently              Exercise Goals Re-Evaluation:  Exercise Goals Re-Evaluation    Row Name 02/10/20 1628 02/28/20 1620 04/03/20 1630         Exercise Goal Re-Evaluation   Exercise Goals Review Increase Physical Activity;Increase Strength and Stamina;Able to understand and use rate of perceived exertion (RPE) scale;Knowledge and understanding of Target Heart Rate Range (THRR);Able to check pulse independently;Understanding of Exercise Prescription Increase Physical Activity;Increase Strength and Stamina;Able to understand and use rate of perceived exertion (RPE) scale;Knowledge and understanding of Target Heart Rate Range (THRR);Able to check pulse independently;Understanding of Exercise Prescription Increase Physical Activity;Increase Strength and Stamina;Able to understand and use rate of perceived exertion (RPE) scale;Knowledge and understanding of Target Heart Rate Range (THRR);Able to check pulse independently;Understanding of  Exercise Prescription     Comments Pt's first day of exercise in the CRP2 program. Pt tolerated exercise Rx well  and understands exercise RX, RPE scale and THRR. Reviewed METs and home exercise Rx. Pt has treadmill at home which he uses from time to time. Encouraged to walk on the treadmill 2-3x/wk for 30 minutes. Pt verbalized understnading of the home exercise Rx and was provided copy. Pt graduated from the [facility] program today. Pt progressed well through program and had an averaage MET level of 3.0. The patient lost 21.15 lbs and plans to continue exercsing at home on his treadmill or at the gym at his workplace.     Expected Outcomes Will continue to monitor patient and progress as tolerated. Pt will walk on his treadmill at home 2-3x/week. Pt will walk on his treadmill at home or exercise at his workplace gym 5x/week for 30-45 minutes.            Nutrition & Weight - Outcomes:  Pre Biometrics - 02/06/20 1517      Pre Biometrics   Height 6' 2.25" (1.886 m)    Weight 187.5 kg    Waist Circumference 59 inches    Hip Circumference 58 inches    Waist to Hip Ratio 1.02 %    BMI (Calculated) 52.71    Triceps Skinfold 34 mm    % Body Fat 47.3 %    Grip Strength 45 kg    Flexibility 0 in    Single Leg Stand 5.81 seconds            Nutrition:  Nutrition Therapy & Goals - 02/12/20 1541      Nutrition Therapy   Diet Low sodium/mod carbs    Drug/Food Interactions Coumadin/Vit K      Personal Nutrition Goals   Nutrition Goal Improved blood glucose control as evidenced by pt's A1c trending from 11.4 toward less than 7.0.    Personal Goal #2 CBG concentrations in the normal range or as close to normal as is safely possible.    Personal Goal #3 Pt to identify food quantities necessary to achieve weight loss of 6-24 lb at graduation from cardiac rehab      [place], educate and counsel regarding individualized specific dietary modifications aiming towards  targeted core components such as weight, hypertension, lipid management, diabetes, heart failure and other comorbidities.    Expected Outcomes Short Term Goal: Understand basic principles of dietary content, such as calories, fat, sodium, cholesterol and nutrients.           Nutrition Discharge:  Nutrition Assessments - 02/11/20 1037      MEDFICTS Scores   Pre Score 42           Education Questionnaire Score:  Knowledge Questionnaire Score - 03/30/20 1630      Knowledge Questionnaire Score   Post Score 23/24           Goals reviewed with patient; copy given to patient. Sean Lawson graduated from cardiac rehab program today with completion of 22 exercise sessions in Phase II. Pt maintained good attendance and progressed nicely during his participation in rehab as evidenced by increased MET level.   Medication list reconciled. Repeat  PHQ score-0  .  Pt has made significant lifestyle changes and should be commended for his success. Pt feels he has achieved his goals during cardiac rehab.   Pt plans to continue exercise by walking on his treadmill at home, walking his dog and working at the gym when he is at work. Sean Lawson increased his distance on his post exercise walk test by 230  feet and lost 8.6 kg since participating in phase 2 cardiac rehab. We are proud of Justin's weight loss and progress. Barnet Pall, RN,BSN 04/21/2020 11:58 AM

## 2020-04-21 NOTE — Addendum Note (Signed)
Encounter addended by: Cammy Copa, RN on: 04/21/2020 12:06 PM  Actions taken: Flowsheet data copied forward, Flowsheet accepted, Clinical Note Signed

## 2020-05-26 ENCOUNTER — Ambulatory Visit (HOSPITAL_COMMUNITY)
Admission: RE | Admit: 2020-05-26 | Discharge: 2020-05-26 | Disposition: A | Discharge status: Home / Self Care | Payer: 59 | Source: Ambulatory Visit | Attending: Cardiology | Admitting: Cardiology

## 2020-05-26 ENCOUNTER — Ambulatory Visit (HOSPITAL_BASED_OUTPATIENT_CLINIC_OR_DEPARTMENT_OTHER)
Admission: RE | Admit: 2020-05-26 | Discharge: 2020-05-26 | Disposition: A | Discharge status: Home / Self Care | Payer: 59 | Source: Ambulatory Visit | Attending: Cardiology | Admitting: Cardiology

## 2020-05-26 ENCOUNTER — Other Ambulatory Visit: Payer: Self-pay

## 2020-05-26 VITALS — BP 110/68 | HR 67 | Ht 75.0 in | Wt 385.2 lb

## 2020-05-26 DIAGNOSIS — Z7901 Long term (current) use of anticoagulants: Secondary | ICD-10-CM | POA: Insufficient documentation

## 2020-05-26 DIAGNOSIS — Z86711 Personal history of pulmonary embolism: Secondary | ICD-10-CM | POA: Insufficient documentation

## 2020-05-26 DIAGNOSIS — E119 Type 2 diabetes mellitus without complications: Secondary | ICD-10-CM | POA: Insufficient documentation

## 2020-05-26 DIAGNOSIS — I5022 Chronic systolic (congestive) heart failure: Secondary | ICD-10-CM | POA: Diagnosis not present

## 2020-05-26 DIAGNOSIS — Z6835 Body mass index (BMI) 35.0-35.9, adult: Secondary | ICD-10-CM | POA: Insufficient documentation

## 2020-05-26 DIAGNOSIS — E669 Obesity, unspecified: Secondary | ICD-10-CM | POA: Insufficient documentation

## 2020-05-26 DIAGNOSIS — I2699 Other pulmonary embolism without acute cor pulmonale: Secondary | ICD-10-CM | POA: Diagnosis not present

## 2020-05-26 DIAGNOSIS — Z79899 Other long term (current) drug therapy: Secondary | ICD-10-CM | POA: Diagnosis not present

## 2020-05-26 DIAGNOSIS — I428 Other cardiomyopathies: Secondary | ICD-10-CM | POA: Diagnosis not present

## 2020-05-26 DIAGNOSIS — G4733 Obstructive sleep apnea (adult) (pediatric): Secondary | ICD-10-CM | POA: Insufficient documentation

## 2020-05-26 DIAGNOSIS — Z8249 Family history of ischemic heart disease and other diseases of the circulatory system: Secondary | ICD-10-CM | POA: Diagnosis not present

## 2020-05-26 DIAGNOSIS — I110 Hypertensive heart disease with heart failure: Secondary | ICD-10-CM | POA: Diagnosis not present

## 2020-05-26 DIAGNOSIS — F1010 Alcohol abuse, uncomplicated: Secondary | ICD-10-CM | POA: Diagnosis not present

## 2020-05-26 LAB — BASIC METABOLIC PANEL
Anion gap: 11 (ref 5–15)
BUN: 15 mg/dL (ref 6–20)
CO2: 23 mmol/L (ref 22–32)
Calcium: 8.8 mg/dL — ABNORMAL LOW (ref 8.9–10.3)
Chloride: 100 mmol/L (ref 98–111)
Creatinine, Ser: 1.05 mg/dL (ref 0.61–1.24)
GFR, Estimated: >60 mL/min (ref >60)
Glucose, Bld: 123 mg/dL — ABNORMAL HIGH (ref 70–99)
Potassium: 4 mmol/L (ref 3.5–5.1)
Sodium: 134 mmol/L — ABNORMAL LOW (ref 135–145)

## 2020-05-26 LAB — ECHOCARDIOGRAM COMPLETE
Area-P 1/2: 4.06 cm2
S' Lateral: 4.1 cm

## 2020-05-26 LAB — DIGOXIN LEVEL: Digoxin Level: 0.4 ng/mL — ABNORMAL LOW (ref 0.8–2.0)

## 2020-05-26 MED ORDER — PERFLUTREN LIPID MICROSPHERE
1.0000 mL | INTRAVENOUS | Status: DC | PRN
  Administered 2020-05-26: 3 mL via INTRAVENOUS
  Filled 2020-05-26: qty 10

## 2020-05-26 MED ORDER — CARVEDILOL 12.5 MG PO TABS: 18.7500 mg | ORAL_TABLET | Freq: Two times a day (BID) | ORAL | 3 refills | Status: DC

## 2020-05-26 NOTE — Patient Instructions (Addendum)
INCREASE Carvedilol 18.75mg  (1 1/2 tablets) twice daily  STOP Digoxin  Labs done today, your results will be available in MyChart, we will contact you for abnormal readings.  If you have any questions or concerns before your next appointment please send [place] a message through [place] or call our office at [phone].    TO LEAVE A MESSAGE FOR THE NURSE SELECT OPTION 2, PLEASE LEAVE A MESSAGE INCLUDING: . YOUR NAME . DATE OF BIRTH . CALL BACK NUMBER . REASON FOR CALL**this is important as we prioritize the call backs  YOU WILL RECEIVE A CALL BACK THE SAME DAY AS LONG AS YOU CALL BEFORE 4:00 PM

## 2020-05-26 NOTE — Progress Notes (Signed)
*  PRELIMINARY RESULTS* Echocardiogram 2D Echocardiogram with definity has been performed.  Sean Lawson 05/26/2020, 12:22 PM

## 2020-05-26 NOTE — Progress Notes (Signed)
Advanced Heart Failure Clinic Note   Referring Physician: PCP: Sean Brunette, MD HF Cardiology: Dr. Shirlee Latch  HPI:  [name] is a 35 y.o. with a history of HTN, obesity, ETOH abuse, systolic heart failure, PE and diabetes who presents for followup of CHF.    Recently admitted early 7/21 with increased dyspnea and leg edema. ECHO showed reduced EF 20-25%. Diuresed with IV lasix and transitioned to po lasix 80 mg daily. Overall diuresed 35 pounds. Had LHC/RHC that showed normal coronaries and elevated filling pressures, CI 2.0. Also new diagnosis of T2DM, Hgb A1c 11.4. Now on metformin + dapagliflozin. He was discharged home 12/03/19 on GDMT w/ plans to repeat 2D echo in 3 months.   Shortly after discharge, he was readmitted on 12/12/19 by IM for acute bilateral PE, he had been sedentary at work and at home. Was referred to ED by PCP for dyspnea and hypoxia. D-dimer elevated at 6.61. EKG showed sinus tachycardia. CT angiogram of the chest confirmed bilateral pulmonary embolism with mild right heart strain. Repeat limited echo showed RV to be mildly enlarged but systolic function normal. LVEF was 30-35%, mildly improved from previous study. BLE Dopplers negative for DVT. Given BMI of 35, not felt to be ideal candidate for DOAC. He was placed on coumadin w/ heparin bridge.   Echo was done today and reviewed, EF is about 45% with normal RV.   He has lost another 17 lbs compared to last appointment here.  Generally feeling well.  No dyspnea walking on flat ground or up stairs.   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  He is working out in the gym 5 days/week, walks on treadmill for 30 minutes. Rare lightheadedness with standing, resolves quickly.  No chest pain.  No orthopnea/PND.   Labs (10/21): digoxin 0.8, K 4, creatinine 1.45  ECG (personally reviewed): NSR, LPFB, poor RWP  PMH: 1. OSA 2. Obesity 3. Prior ETOH abuse 4. Type 2  diabetes 5. Pulmonary embolus: 7/21, bilateral.  6. Chronic systolic CHF: Nonischemic cardiomyopathy.  - Echo (11/28/19): EF 20-25%, mild LVE, poorly visualized RV.  - RHC/LHC (7/21): No significant CAD, CI 2.0 - Echo (12/13/19): EF 30-35%, normal RV.  - Echo (1/22): EF 45%, normal RV 7. HTN  Review of Systems: All systems reviewed and negative except as per HPI.   Current Outpatient Medications  Medication Sig Dispense Refill  . Dapagliflozin-metFORMIN HCl ER 09-998 MG TB24 Take 1 tablet by mouth 2 (two) times daily.     . furosemide (LASIX) 80 MG tablet Take 80 mg by mouth daily as needed for fluid or edema.    . magnesium oxide (MAG-OX) 400 MG tablet Take 400 mg by mouth daily.    Letta Pate VERIO test strip 1 each 2 (two) times daily. Use as directed to check blood sugar twice daily.    . pantoprazole (PROTONIX) 40 MG tablet Take 1 tablet (40 mg total) by mouth daily. 30 tablet 0  . sacubitril-valsartan (ENTRESTO) 97-103 MG Take 1 tablet by mouth 2 (two) times daily. 180 tablet 3  . Semaglutide, 1 MG/DOSE, (OZEMPIC, 1 MG/DOSE,) 4 MG/3ML SOPN 1 mg    . spironolactone (ALDACTONE) 25 MG tablet Take 1 tablet (25 mg total) by mouth daily. 30 tablet 6  . traMADol (ULTRAM) 50 MG tablet Take by mouth as needed.    . warfarin (COUMADIN) 5 MG tablet Take 1.5 tablets (7.5 mg total) by mouth  daily at 4 PM. 45 tablet 0  . carvedilol (COREG) 12.5 MG tablet Take 1.5 tablets (18.75 mg total) by mouth 2 (two) times daily with a meal. 240 tablet 3   No current facility-administered medications for this encounter.    No Known Allergies    Social History   Socioeconomic History  . Marital status: Single    Spouse name: Not on file  . Number of children: 0  . Years of education: Not on file  . Highest education level: Master's degree (e.g., MA, MS, MEng, MEd, MSW, MBA)  Occupational History  . 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 Smoking status: Never Smoker  . Smokeless tobacco: Former  Neurosurgeon    Types: Snuff  Vaping Use  . Vaping Use: Never used  Substance and Sexual Activity  . Alcohol use: Yes    Alcohol/week: 30.0 standard drinks    Types: 30 Shots of liquor per week    Comment: weekly  . Drug use: Never  . Sexual activity: Not Currently    Birth control/protection: None  Other Topics Concern  . Not on file  Social History Narrative  . Not on file   Social Determinants of Health   Financial Resource Strain: Not on file  Food Insecurity: Not on file  Transportation Needs: Not on file  Physical Activity: Not on file  Stress: Not on file  Social Connections: Not on file  Intimate Partner Violence: Not on file      Family History  Problem Relation Age of Onset  . Diabetes Mellitus II Mother   . Heart failure Maternal Grandmother   . Heart disease Maternal Grandfather     Vitals:   05/26/20 1207  BP: 110/68  Pulse: 67  SpO2: 95%  Weight: (!) 174.7 kg (385 lb 3.2 oz)  Height: 6\' 3"  (1.905 m)     PHYSICAL EXAM: General: NAD, obese Neck: No JVD, no thyromegaly or thyroid nodule.  Lungs: Clear to auscultation bilaterally with normal respiratory effort. CV: Nonpalpable PMI.  Heart regular S1/S2, no S3/S4, no murmur.  No peripheral edema.  No carotid bruit.  Normal pedal pulses.  Abdomen: Soft, nontender, no hepatosplenomegaly, no distention.  Skin: Intact without lesions or rashes. Varicose veins lower legs Neurologic: Alert and oriented x 3.  Psych: Normal affect. Extremities: No clubbing or cyanosis.  HEENT: Normal.   ASSESSMENT & PLAN:   1. Chronic Systolic CHF: Nonischemic cardiomyopathy.  Echo 11/28/19 was difficult but EF appeared to be 20-25%. Cause is uncertain. However, heavy ETOH may play a role as well as HTN and untreated diabetes. Cannot rule out viral myocarditis. LHC showed no coronary disease. RHC w/low CI at 2.0. Repeat limited Echo 12/12/19 showed slight improvement in LVEF 30-35%, RV systolic function normal.  Echo was done today  and reviewed, EF 45% with normal RV.  On exam today, he is not volume overloaded.  NYHA class II symptoms.  - He can continue to use Lasix prn.  - With improvement in EF, I think he can stop digoxin.  - Continue spironolactone 25 mg daily. BMET today.  - Increase Coreg to 18.75 mg bid.  - Continue Entresto 97/103 bid. - Continue dapagliflozin.  - Continue to remain abstinent from ETOH. - EF is out of ICD range currently.  Will repeat echo in 6 months.  2. Bilateral PE: diagnosed 12/13/19. Bilateral LE venous dopplers negative for PE. Limited echo w/ mildly enlarged RV but normal systolic function. Suspect triggered by sedentary lifestyle +  CHF.   - Continue warfarin.  3. Type 2 diabetes: Diagnosed in 7/21 with hgbA1c 11.  - on Dapagliglozin- Metformin 3. OSA: Use CPAP.  4. ETOH abuse: Former heavy drinker. He reports he has quit 5. Obesity: Continue diet/exercise efforts. Weight coming down.   Followup in 4 months.   Loralie Champagne, MD 05/26/20

## 2020-05-28 NOTE — Telephone Encounter (Addendum)
Patient has a 10 week follow up appointment scheduled for 06/25/20. Patient understands he needs to keep this appointment for insurance compliance. Left detailed message on voicemail and informed patient to call back with questions.

## 2020-06-25 ENCOUNTER — Telehealth: Payer: 59 | Admitting: Cardiology

## 2020-06-29 ENCOUNTER — Other Ambulatory Visit (HOSPITAL_COMMUNITY): Payer: Self-pay | Admitting: Adult Health

## 2020-06-30 ENCOUNTER — Telehealth: Payer: 59 | Admitting: Cardiology

## 2020-07-08 ENCOUNTER — Telehealth (INDEPENDENT_AMBULATORY_CARE_PROVIDER_SITE_OTHER): Payer: 59 | Admitting: Cardiology

## 2020-07-08 ENCOUNTER — Other Ambulatory Visit: Payer: Self-pay

## 2020-07-08 ENCOUNTER — Encounter: Payer: Self-pay | Admitting: Cardiology

## 2020-07-08 ENCOUNTER — Telehealth: Payer: Self-pay | Admitting: *Deleted

## 2020-07-08 VITALS — BP 127/98 | HR 86 | Ht 75.0 in | Wt 372.0 lb

## 2020-07-08 DIAGNOSIS — G4733 Obstructive sleep apnea (adult) (pediatric): Secondary | ICD-10-CM

## 2020-07-08 DIAGNOSIS — I10 Essential (primary) hypertension: Secondary | ICD-10-CM

## 2020-07-08 NOTE — Telephone Encounter (Deleted)
Order placed to [facility].

## 2020-07-08 NOTE — Patient Instructions (Signed)
Medication Instructions:  Your physician recommends that you continue on your current medications as directed. Please refer to the Current Medication list given to you today.  *If you need a refill on your cardiac medications before your next appointment, please call your pharmacy*   Follow-Up: At [facility], you and your health needs are our priority.  As part of our continuing mission to provide you with exceptional heart care, we have created designated Provider Care Teams.  These Care Teams include your primary Cardiologist (physician) and Advanced Practice Providers (APPs -  Physician Assistants and Nurse Practitioners) who all work together to provide you with the care you need, when you need it.  Your next appointment:   4 week(s)  The format for your next appointment:   Virtual Visit   Provider:   Traci Turner, MD    

## 2020-07-08 NOTE — Progress Notes (Signed)
Virtual Visit via Video Note   This visit type was conducted due to national recommendations for restrictions regarding the COVID-19 Pandemic (e.g. social distancing) in an effort to limit this patient's exposure and mitigate transmission in our community.  Due to his co-morbid illnesses, this patient is at least at moderate risk for complications without adequate follow up.  This format is felt to be most appropriate for this patient at this time.  All issues noted in this document were discussed and addressed.  A limited physical exam was performed with this format.  Please refer to the patient's chart for his consent to telehealth for [facility].  Date:  07/08/2020   ID:  Sean Lawson, DOB [birth year], MRN [id number] The patient was identified using 2 identifiers.  Patient Location: Home Provider Location: Office/Clinic   PCP:  [name], MD   [facility]  Cardiologist:  Donato Schultz, MD  Electrophysiologist:  None  Advanced Heart Failure Clinic:  None    { Evaluation Performed:  Follow-Up Visit  Chief Complaint:  OSA  History of Present Illness:    Sean Lawson is a 35 y.o. male with a hx of chronic systolic CHF, DM and HTN who was referred for home sleep study which showed severe OSA with an AHi of 78/hr and nocturnal hypoxemia with O2 sats < 88% for 283 minutes and were as low as 74%.  He underwent CPAP titration but due to ongoing respiratory events was referred for BIPAP titration and titrated to 18/14cm H2O.  He tells me that prior to CPAP he would wake up gasping for air and did not sleep well.  He would wake up with a dry mouth and be very tired during the day and have to take a nap.   He works 3rd shift but is having problems tolerating the PAP device.  He wake up with dry mouth and sore throat.  He uses a nasal mask and chin strap but does not like that and he tells me that he mouth breaths. He says that the pressure is fine.  He is waking  up an hour after he falls asleep and thinks it is the mask that makes him wake up.    The patient does not have symptoms concerning for COVID-19 infection (fever, chills, cough, or new shortness of breath).   Past Medical History:  Diagnosis Date  . Chronic systolic CHF (congestive heart failure) (HCC) 12/13/2019  . Diabetes mellitus without complication (HCC)   . Hypertension    Past Surgical History:  Procedure Laterality Date  . CARDIAC CATHETERIZATION    . RIGHT/LEFT HEART CATH AND CORONARY ANGIOGRAPHY N/A 12/02/2019   Procedure: RIGHT/LEFT HEART CATH AND CORONARY ANGIOGRAPHY;  Surgeon: Laurey Morale, MD;  Location: [facility] INVASIVE CV LAB;  Service: Cardiovascular;  Laterality: N/A;     No outpatient medications have been marked as taking for the 07/08/20 encounter (Video Visit) with Quintella Reichert, MD.     Allergies:   Patient has no known allergies.   Social History   Tobacco Use  . Smoking status: Never Smoker  . Smokeless tobacco: Former Neurosurgeon    Types: Snuff  Vaping Use  . Vaping Use: Never used  Substance Use Topics  . Alcohol use: Yes    Alcohol/week: 30.0 standard drinks    Types: 30 Shots of liquor per week    Comment: weekly  . Drug use: Never     Family Hx: The patient's family  history includes Diabetes Mellitus II in his mother; Heart disease in his maternal grandfather; Heart failure in his maternal grandmother.  ROS:   Please see the history of present illness.    All other systems reviewed and are negative.   Prior CV studies:   The following studies were reviewed today:  Sleep study, PAP titration and PAP compliance download  Labs/Other Tests and Data Reviewed:    EKG:  No ECG reviewed.  Recent Labs: 11/27/2019: B Natriuretic Peptide 441.1 11/28/2019: TSH 1.976 12/01/2019: ALT 59 12/03/2019: Magnesium 1.5 12/17/2019: Hemoglobin 12.7; Platelets 309 05/26/2020: BUN 15; Creatinine, Ser 1.05; Potassium 4.0; Sodium 134   Recent Lipid Panel Lab Results   Component Value Date/Time   CHOL 122 11/28/2019 07:16 AM   TRIG 89 11/28/2019 07:16 AM   HDL 22 (L) 11/28/2019 07:16 AM   CHOLHDL 5.5 11/28/2019 07:16 AM   LDLCALC 82 11/28/2019 07:16 AM    Wt Readings from Last 3 Encounters:  07/08/20 (!) 372 lb (168.7 kg)  05/26/20 (!) 385 lb 3.2 oz (174.7 kg)  03/17/20 (!) 399 lb 6.4 oz (181.2 kg)     Risk Assessment/Calculations:      Objective:    Vital Signs:  BP (!) 127/98   Pulse 86   Ht 6\' 3"  (1.905 m)   Wt (!) 372 lb (168.7 kg)   SpO2 94%   BMI 46.50 kg/m    VITAL SIGNS:  reviewed GEN:  no acute distress EYES:  sclerae anicteric, EOMI - Extraocular Movements Intact RESPIRATORY:  normal respiratory effort, symmetric expansion CARDIOVASCULAR:  no peripheral edema SKIN:  no rash, lesions or ulcers. MUSCULOSKELETAL:  no obvious deformities. NEURO:  alert and oriented x 3, no obvious focal deficit PSYCH:  normal affect  ASSESSMENT & PLAN:    OSA - The pathophysiology of obstructive sleep apnea , it's cardiovascular consequences & modes of treatment including CPAP were discused with the patient in detail & they evidenced understanding.  The patient is tolerating PAP therapy well without any problems. The PAP download was reviewed today and showed an AHI of 0.6/hr on 18/14 cm H2O with 10% compliance in using more than 4 hours nightly.  The patient has been using and benefiting from PAP use and will continue to benefit from therapy.  -I want to change his mask to a Dreamwear under the nose FFM since he is a mouth breather and is waking up with sore throat and dry mouth.  Hopefully this will improve compliance  2.  HTN -BP borderline controlled on exam -continue carvedilol 18.75mg  BID, spiro 25mg  daily and Entresto 97-103mg  BID  3.  Morbid Obesity -I have encouraged him to continue in his routine exercise program and cut back on carbs and portions. -he hast lost 13lbs in the past month and I have congratulated him    COVID-19  Education: The signs and symptoms of COVID-19 were discussed with the patient and how to seek care for testing (follow up with PCP or arrange E-visit).  The importance of social distancing was discussed today.  Time:   Today, I have spent 20 minutes with the patient with telehealth technology discussing the above problems.   Medication Adjustments/Labs and Tests Ordered: Current medicines are reviewed at length with the patient today.  Concerns regarding medicines are outlined above.   Tests Ordered: No orders of the defined types were placed in this encounter.   Medication Changes: No orders of the defined types were placed in this encounter.   Follow  Up:  In Person in 1 year(s)  Signed, Armanda Magic, MD  07/08/2020 1:55 PM    [facility]

## 2020-07-08 NOTE — Telephone Encounter (Signed)
-----   Message from Quintella Reichert, MD sent at 07/08/2020  2:04 PM EST ----- Please order an under the nose Dreamware FFM and get a download in 2 weeks

## 2020-07-23 NOTE — Telephone Encounter (Addendum)
DME is [facility] not [facility]. [facility] will pick him up for using only 10% which is non compliance. Patient will need ov to explain why he didn't use his machine and start over.

## 2020-07-24 NOTE — Telephone Encounter (Signed)
ok 

## 2020-08-10 ENCOUNTER — Telehealth (INDEPENDENT_AMBULATORY_CARE_PROVIDER_SITE_OTHER): Payer: 59 | Admitting: Cardiology

## 2020-08-10 ENCOUNTER — Other Ambulatory Visit: Payer: Self-pay

## 2020-08-10 ENCOUNTER — Encounter: Payer: Self-pay | Admitting: Cardiology

## 2020-08-10 VITALS — BP 118/87 | HR 91 | Ht 75.0 in | Wt 368.0 lb

## 2020-08-10 DIAGNOSIS — G4733 Obstructive sleep apnea (adult) (pediatric): Secondary | ICD-10-CM | POA: Diagnosis not present

## 2020-08-10 DIAGNOSIS — I10 Essential (primary) hypertension: Secondary | ICD-10-CM

## 2020-08-10 NOTE — Progress Notes (Signed)
_   Virtual Visit via Telephone Note   This visit type was conducted due to national recommendations for restrictions regarding the COVID-19 Pandemic (e.g. social distancing) in an effort to limit this patient's exposure and mitigate transmission in our community.  Due to his co-morbid illnesses, this patient is at least at moderate risk for complications without adequate follow up.  This format is felt to be most appropriate for this patient at this time.  All issues noted in this document were discussed and addressed.  A limited physical exam was performed with this format.  Please refer to the patient's chart for his consent to telehealth for [facility].  Date:  08/10/2020   ID:  SINGLETON HICKOX, DOB [birth year], MRN [id number] The patient was identified using 2 identifiers.  Patient Location: Home Provider Location: Office/Clinic   PCP:  [name], MD   [facility]  Cardiologist:  Donato Schultz, MD  Electrophysiologist:  None  Advanced Heart Failure Clinic:  None    { Evaluation Performed:  Follow-Up Visit  Chief Complaint:  OSA  History of Present Illness:    Sean Lawson is a 35 y.o. male with a hx of chronic systolic CHF, DM and HTN who was referred for home sleep study which showed severe OSA with an AHi of 78/hr and nocturnal hypoxemia with O2 sats < 88% for 283 minutes and were as low as 74%.  He underwent CPAP titration but due to ongoing respiratory events was referred for BIPAP titration and titrated to 18/14cm H2O.  He tells me that prior to CPAP he would wake up gasping for air and did not sleep well.  He would wake up with a dry mouth and be very tired during the day and have to take a nap.   At last OV he was having problems being compliant with his device.  He works 3rd shift but is having problems tolerating the PAP device.  He would wake up with dry mouth and sore throat.  He was using a nasal mask and chin strap but did not like that and  he is a mouth breather. He said that the pressure is fine.  He also was waking up an hour after he fell asleep and thought it was the mask that  Would wake him up.  I changed him to a Dreamwear under the nose full face mask and he is now here for followup.    Unfortunately the DME called and said that he needed to turn his PAP device in, despite multiple notes in the chart stating that he was noncompliant due to problems with his mask fitting.  He now has no device.   The patient does not have symptoms concerning for COVID-19 infection (fever, chills, cough, or new shortness of breath).   Past Medical History:  Diagnosis Date  . Chronic systolic CHF (congestive heart failure) (HCC) 12/13/2019  . Diabetes mellitus without complication (HCC)   . Hypertension    Past Surgical History:  Procedure Laterality Date  . CARDIAC CATHETERIZATION    . RIGHT/LEFT HEART CATH AND CORONARY ANGIOGRAPHY N/A 12/02/2019   Procedure: RIGHT/LEFT HEART CATH AND CORONARY ANGIOGRAPHY;  Surgeon: Laurey Morale, MD;  Location: [facility] INVASIVE CV LAB;  Service: Cardiovascular;  Laterality: N/A;     Current Meds  Medication Sig  . carvedilol (COREG) 12.5 MG tablet Take 1.5 tablets (18.75 mg total) by mouth 2 (two) times daily with a meal.  . Dapagliflozin-metFORMIN HCl ER  09-998 MG TB24 Take 1 tablet by mouth 2 (two) times daily.   . furosemide (LASIX) 80 MG tablet Take 80 mg by mouth daily as needed for fluid or edema.  . magnesium oxide (MAG-OX) 400 MG tablet Take 400 mg by mouth daily.  Letta Pate VERIO test strip 1 each 2 (two) times daily. Use as directed to check blood sugar twice daily.  . pantoprazole (PROTONIX) 40 MG tablet Take 1 tablet (40 mg total) by mouth daily.  . sacubitril-valsartan (ENTRESTO) 97-103 MG Take 1 tablet by mouth 2 (two) times daily.  . Semaglutide, 1 MG/DOSE, (OZEMPIC, 1 MG/DOSE,) 4 MG/3ML SOPN Inject as directed once a week.  . spironolactone (ALDACTONE) 25 MG tablet TAKE ONE TABLET BY  MOUTH DAILY  . traMADol (ULTRAM) 50 MG tablet Take by mouth as needed.  . warfarin (COUMADIN) 5 MG tablet Take 1.5 tablets (7.5 mg total) by mouth daily at 4 PM.     Allergies:   Patient has no known allergies.   Social History   Tobacco Use  . Smoking status: Never Smoker  . Smokeless tobacco: Former Neurosurgeon    Types: Snuff  Vaping Use  . Vaping Use: Never used  Substance Use Topics  . Alcohol use: Yes    Alcohol/week: 30.0 standard drinks    Types: 30 Shots of liquor per week    Comment: weekly  . Drug use: Never     Family Hx: The patient's family history includes Diabetes Mellitus II in his mother; Heart disease in his maternal grandfather; Heart failure in his maternal grandmother.  ROS:   Please see the history of present illness.    All other systems reviewed and are negative.   Prior CV studies:   The following studies were reviewed today:  Sleep study, PAP titration and PAP compliance download  Labs/Other Tests and Data Reviewed:    EKG:  No ECG reviewed.  Recent Labs: 11/27/2019: B Natriuretic Peptide 441.1 11/28/2019: TSH 1.976 12/01/2019: ALT 59 12/03/2019: Magnesium 1.5 12/17/2019: Hemoglobin 12.7; Platelets 309 05/26/2020: BUN 15; Creatinine, Ser 1.05; Potassium 4.0; Sodium 134   Recent Lipid Panel Lab Results  Component Value Date/Time   CHOL 122 11/28/2019 07:16 AM   TRIG 89 11/28/2019 07:16 AM   HDL 22 (L) 11/28/2019 07:16 AM   CHOLHDL 5.5 11/28/2019 07:16 AM   LDLCALC 82 11/28/2019 07:16 AM    Wt Readings from Last 3 Encounters:  08/10/20 (!) 368 lb (166.9 kg)  07/08/20 (!) 372 lb (168.7 kg)  05/26/20 (!) 385 lb 3.2 oz (174.7 kg)     Risk Assessment/Calculations:      Objective:    Vital Signs:  BP 118/87   Pulse 91   Ht 6\' 3"  (1.905 m)   Wt (!) 368 lb (166.9 kg)   BMI 46.00 kg/m    ASSESSMENT & PLAN:    1.  OSA  -he has severe OSA and needs to be on PAP therapy -he was noncompliant due to issues with getting a mask that fit  well -he is motivated to use his device but the DME made him and it in due to noncompliance despite multiple notes in the chart stating why he was noncompliant and that we were actively trying to get a mask that worked. -I will reorder a ResMed BiPAP on 18/14cm H2O with heated humidity and Dreamwear under the nose FFM and followup with me 2 weeks after he gets his device  2.  HTN -BP controlled on exam -continue  carvedilol 18.75mg  BID, spiro 25mg  daily and Entresto 97-103mg  BID   COVID-19 Education: The signs and symptoms of COVID-19 were discussed with the patient and how to seek care for testing (follow up with PCP or arrange E-visit).  The importance of social distancing was discussed today.  Time:   Today, I have spent 20 minutes with the patient with telehealth technology discussing the above problems.   Medication Adjustments/Labs and Tests Ordered: Current medicines are reviewed at length with the patient today.  Concerns regarding medicines are outlined above.   Tests Ordered: No orders of the defined types were placed in this encounter.   Medication Changes: No orders of the defined types were placed in this encounter.   Follow Up:  In Person in 1 year(s)  Signed, , MD  08/10/2020 10:58 AM    [facility]

## 2020-08-11 NOTE — Telephone Encounter (Addendum)
Per [facility] patient has to start over with a split night study fail cpap and move to bipap.

## 2020-08-11 NOTE — Telephone Encounter (Signed)
Quintella Reichert, MD  [name], CMA he is motivated to use his device but the DME made him and it in due to noncompliance despite multiple notes in the chart stating why he was noncompliant and that we were actively trying to get a mask that worked.  -reorder a ResMed BiPAP on 18/1cm H2O with heated humidity and Dreamwear under the nose FFM and followup with me 2 weeks after he gets his device

## 2020-08-11 NOTE — Addendum Note (Signed)
Addended by: [name] on: 08/11/2020 03:04 PM   Modules accepted: Orders

## 2020-08-11 NOTE — Telephone Encounter (Signed)
Please order split night sleep study 

## 2020-08-12 ENCOUNTER — Telehealth: Payer: Self-pay | Admitting: Cardiology

## 2020-08-12 NOTE — Telephone Encounter (Signed)
08/12/20 11:53am Auth Pended; Tracking ID: [id number]

## 2020-08-17 ENCOUNTER — Telehealth: Payer: Self-pay | Admitting: *Deleted

## 2020-08-17 DIAGNOSIS — G4733 Obstructive sleep apnea (adult) (pediatric): Secondary | ICD-10-CM

## 2020-08-17 NOTE — Telephone Encounter (Signed)
Staff message sent to [facility] repeat Split night sleep study has been denied by [facility]. Please notify ordering provider.

## 2020-08-17 NOTE — Telephone Encounter (Signed)
-----   Message from [name], CMA sent at 08/17/2020 10:40 AM EDT ----- Regarding: RE: precert (sent one time) Split night has been denied by [facility]. Please notify ordering provider. ----- Message ----- From: [name], CMA Sent: 08/11/2020  12:48 PM EDT To: [name] Subject: precert (sent one time)                        Patient failed cpap the first time send back for split night study

## 2020-08-24 NOTE — Telephone Encounter (Signed)
:   precert (sent one time) Received: 6 days ago Sean Lawson, Sean Bryant, MD  [name], CMA SO will insurance pay for PSG        From: [name], CMA  Sent: 08/18/2020  4:56 PM EDT  To: Quintella Reichert, MD  Subject: RE: precert (sent one time)            Per choice when you lose your machine for noncompliance you have to start over with a sleep study.  ----- Message -----  From: Quintella Reichert, MD  Sent: 08/17/2020  2:37 PM EDT  To: [name], CMA  Subject: RE: precert (sent one time)            [name] I am confused - he needs a repeat BiPAP titration as he was noncompliant and they took his device

## 2020-09-08 NOTE — Telephone Encounter (Addendum)
Per dr Mayford Knife NPSG submitted for review. Awaiting precert to return.

## 2020-09-08 NOTE — Addendum Note (Signed)
Addended by: [name] on: 09/08/2020 03:58 PM   Modules accepted: Orders

## 2020-09-14 ENCOUNTER — Telehealth: Payer: Self-pay | Admitting: *Deleted

## 2020-09-14 NOTE — Telephone Encounter (Signed)
Staff message sent to [name] ok to schedule NSPG. Auth received from [facility]. Auth 3 [id number]. Valid dates 09/09/20 to 12/07/20.

## 2020-09-15 ENCOUNTER — Telehealth: Payer: 59 | Admitting: Cardiology

## 2020-09-22 ENCOUNTER — Telehealth: Payer: Self-pay | Admitting: *Deleted

## 2020-09-22 NOTE — Telephone Encounter (Signed)
RE: precert [name], CMA  [name], CMA Ok to schedule NSPG. Approval received from [facility].

## 2020-09-22 NOTE — Telephone Encounter (Signed)
Patient is scheduled for lab study on 11-19-20. Patient understands his sleep study will be done at [facility] sleep lab. Patient understands he will receive a sleep packet in a week or so. Patient understands to call if he does not receive the sleep packet in a timely manner.  Left detailed message on voicemail with date and time of titration and informed patient to call back to confirm or reschedule.

## 2020-09-22 NOTE — Telephone Encounter (Signed)
-----   Message from [name], CMA sent at 09/14/2020  8:40 AM EDT ----- Regarding: RE: precert Ok to schedule NSPG. Approval received from [facility]. ----- Message ----- From: [name], CMA Sent: 09/08/2020   3:55 PM EDT To: [name] Subject: precert                                        ----- Message from [name], CMA sent at 08/17/2020 10:40 AM EDT ----- Regarding: RE: precert (sent one time) Split night has been denied by [facility]. Please notify ordering provider   Order repeat NPSG

## 2020-09-24 ENCOUNTER — Encounter (HOSPITAL_COMMUNITY): Payer: 59 | Admitting: Cardiology

## 2020-10-02 ENCOUNTER — Other Ambulatory Visit (HOSPITAL_COMMUNITY): Payer: Self-pay

## 2020-10-02 ENCOUNTER — Encounter (HOSPITAL_COMMUNITY): Payer: Self-pay | Admitting: Cardiology

## 2020-10-02 ENCOUNTER — Ambulatory Visit (HOSPITAL_COMMUNITY)
Admission: RE | Admit: 2020-10-02 | Discharge: 2020-10-02 | Disposition: A | Discharge status: Home / Self Care | Payer: 59 | Source: Ambulatory Visit | Attending: Cardiology | Admitting: Cardiology

## 2020-10-02 ENCOUNTER — Other Ambulatory Visit: Payer: Self-pay

## 2020-10-02 VITALS — BP 104/70 | HR 68 | Wt 382.0 lb

## 2020-10-02 DIAGNOSIS — E669 Obesity, unspecified: Secondary | ICD-10-CM | POA: Insufficient documentation

## 2020-10-02 DIAGNOSIS — Z7984 Long term (current) use of oral hypoglycemic drugs: Secondary | ICD-10-CM | POA: Insufficient documentation

## 2020-10-02 DIAGNOSIS — I110 Hypertensive heart disease with heart failure: Secondary | ICD-10-CM | POA: Insufficient documentation

## 2020-10-02 DIAGNOSIS — E119 Type 2 diabetes mellitus without complications: Secondary | ICD-10-CM | POA: Diagnosis not present

## 2020-10-02 DIAGNOSIS — Z86711 Personal history of pulmonary embolism: Secondary | ICD-10-CM | POA: Insufficient documentation

## 2020-10-02 DIAGNOSIS — Z8249 Family history of ischemic heart disease and other diseases of the circulatory system: Secondary | ICD-10-CM | POA: Insufficient documentation

## 2020-10-02 DIAGNOSIS — G4733 Obstructive sleep apnea (adult) (pediatric): Secondary | ICD-10-CM | POA: Diagnosis not present

## 2020-10-02 DIAGNOSIS — Z6835 Body mass index (BMI) 35.0-35.9, adult: Secondary | ICD-10-CM | POA: Insufficient documentation

## 2020-10-02 DIAGNOSIS — Z79899 Other long term (current) drug therapy: Secondary | ICD-10-CM | POA: Insufficient documentation

## 2020-10-02 DIAGNOSIS — Z7901 Long term (current) use of anticoagulants: Secondary | ICD-10-CM | POA: Diagnosis not present

## 2020-10-02 DIAGNOSIS — I5022 Chronic systolic (congestive) heart failure: Secondary | ICD-10-CM | POA: Diagnosis not present

## 2020-10-02 DIAGNOSIS — F1010 Alcohol abuse, uncomplicated: Secondary | ICD-10-CM | POA: Insufficient documentation

## 2020-10-02 LAB — BASIC METABOLIC PANEL
Anion gap: 8 (ref 5–15)
BUN: 15 mg/dL (ref 6–20)
CO2: 29 mmol/L (ref 22–32)
Calcium: 9.3 mg/dL (ref 8.9–10.3)
Chloride: 100 mmol/L (ref 98–111)
Creatinine, Ser: 0.99 mg/dL (ref 0.61–1.24)
GFR, Estimated: >60 mL/min (ref >60)
Glucose, Bld: 115 mg/dL — ABNORMAL HIGH (ref 70–99)
Potassium: 3.8 mmol/L (ref 3.5–5.1)
Sodium: 137 mmol/L (ref 135–145)

## 2020-10-02 LAB — PROTIME-INR
INR: 1.4 — ABNORMAL HIGH (ref 0.8–1.2)
Prothrombin Time: 17 seconds — ABNORMAL HIGH (ref 11.4–15.2)

## 2020-10-02 MED ORDER — CARVEDILOL 25 MG PO TABS: 25.0000 mg | ORAL_TABLET | Freq: Two times a day (BID) | ORAL | 6 refills | Status: DC

## 2020-10-02 MED ORDER — APIXABAN 5 MG PO TABS: 5.0000 mg | ORAL_TABLET | Freq: Two times a day (BID) | ORAL | 6 refills | Status: DC

## 2020-10-02 NOTE — Patient Instructions (Signed)
Stop Coumadin  Start Eliquis 5 mg Twice daily **WE WILL CALL AND TELL YOU WHEN TO START THIS**  Increase Carvedilol to 25 mg Twice daily   Labs done today, your results will be available in MyChart, we will contact you for abnormal readings.  Your physician recommends that you schedule a follow-up appointment in: 3 months with echocardiogram  If you have any questions or concerns before your next appointment please send [place] a message through [place] or call our office at [phone].    TO LEAVE A MESSAGE FOR THE NURSE SELECT OPTION 2, PLEASE LEAVE A MESSAGE INCLUDING: . YOUR NAME . DATE OF BIRTH . CALL BACK NUMBER . REASON FOR CALL**this is important as we prioritize the call backs  YOU WILL RECEIVE A CALL BACK THE SAME DAY AS LONG AS YOU CALL BEFORE 4:00 PM  At the [facility], you and your health needs are our priority. As part of our continuing mission to provide you with exceptional heart care, we have created designated Provider Care Teams. These Care Teams include your primary Cardiologist (physician) and Advanced Practice Providers (APPs- Physician Assistants and Nurse Practitioners) who all work together to provide you with the care you need, when you need it.   You may see any of the following providers on your designated Care Team at your next follow up: Marland Kitchen Dr Arvilla Meres . Dr Marca Ancona . Dr Thornell Mule . Tonye Becket, NP . Robbie Lis, PA . Shanda Bumps [place],NP . Karle Plumber, PharmD   Please be sure to bring in all your medications bottles to every appointment.

## 2020-10-04 NOTE — Progress Notes (Signed)
[facility] Note   Referring Physician: PCP: [name], MD HF Cardiology: Dr. Shirlee Latch  HPI:  Sean Lawson is a 35 y.o. with a history of HTN, obesity, ETOH abuse, systolic heart failure, PE and diabetes who presents for followup of CHF.    Recently admitted early 7/21 with increased dyspnea and leg edema. ECHO showed reduced EF 20-25%. Diuresed with IV lasix and transitioned to po lasix 80 mg daily. Overall diuresed 35 pounds. Had LHC/RHC that showed normal coronaries and elevated filling pressures, CI 2.0. Also new diagnosis of T2DM, Hgb A1c 11.4. Now on metformin + dapagliflozin. He was discharged home 12/03/19 on GDMT w/ plans to repeat 2D echo in 3 months.   Shortly after discharge, he was readmitted on 12/12/19 by IM for acute bilateral PE, he had been sedentary at work and at home. Was referred to ED by PCP for dyspnea and hypoxia. D-dimer elevated at 6.61. EKG showed sinus tachycardia. CT angiogram of the chest confirmed bilateral pulmonary embolism with mild right heart strain. Repeat limited echo showed RV to be mildly enlarged but systolic function normal. LVEF was 30-35%, mildly improved from previous study. BLE Dopplers negative for DVT. Given BMI of 35, not felt to be ideal candidate for DOAC. He was placed on coumadin w/ heparin bridge.   Echo in 1/22 showed that EF was about 45% with normal RV.   He has lost another 3 lbs compared to last appointment here.   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  Minimal ETOH now.  Having trouble with CPAP.  Not eating well on days that he works.  Uses Lasix occasionally.  No lightheadedness.  No exertional dyspnea now.  Trying to stay active. No orthopnea/PND.  Goes to the gym, uses weights and the treadmill.   Labs (10/21): digoxin 0.8, K 4, creatinine 1.45 Labs (1/22): K 4, creatinine 1.05  PMH: 1. OSA 2. Obesity 3. Prior ETOH abuse 4. Type 2 diabetes 5.  Pulmonary embolus: 7/21, bilateral.  6. Chronic systolic CHF: Nonischemic cardiomyopathy.  - Echo (11/28/19): EF 20-25%, mild LVE, poorly visualized RV.  - RHC/LHC (7/21): No significant CAD, CI 2.0 - Echo (12/13/19): EF 30-35%, normal RV.  - Echo (1/22): EF 45%, normal RV 7. HTN  Review of Systems: All systems reviewed and negative except as per HPI.   Current Outpatient Medications  Medication Sig Dispense Refill  . apixaban (ELIQUIS) 5 MG TABS tablet Take 1 tablet (5 mg total) by mouth 2 (two) times daily. 60 tablet 6  . Dapagliflozin-metFORMIN HCl ER 09-998 MG TB24 Take 1 tablet by mouth 2 (two) times daily.     . furosemide (LASIX) 80 MG tablet Take 80 mg by mouth daily as needed for fluid or edema.    . magnesium oxide (MAG-OX) 400 MG tablet Take 400 mg by mouth daily.    Letta Pate VERIO test strip 1 each 2 (two) times daily. Use as directed to check blood sugar twice daily.    . pantoprazole (PROTONIX) 40 MG tablet Take 1 tablet (40 mg total) by mouth daily. 30 tablet 0  . sacubitril-valsartan (ENTRESTO) 97-103 MG Take 1 tablet by mouth 2 (two) times daily. 180 tablet 3  . Semaglutide, 1 MG/DOSE, (OZEMPIC, 1 MG/DOSE,) 4 MG/3ML SOPN Inject as directed once a week.    . spironolactone (ALDACTONE) 25 MG tablet TAKE ONE TABLET BY MOUTH DAILY 30 tablet 6  . traMADol (ULTRAM)  50 MG tablet Take by mouth as needed.    . carvedilol (COREG) 25 MG tablet Take 1 tablet (25 mg total) by mouth 2 (two) times daily with a meal. 60 tablet 6   No current facility-administered medications for this encounter.    No Known Allergies    Social History   Socioeconomic History  . Marital status: Single    Spouse name: Not on file  . Number of children: 0  . Years of education: Not on file  . Highest education level: Master's degree (e.g., MA, MS, MEng, MEd, MSW, MBA)  Occupational History  . 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 Smoking status: Never Smoker  . Smokeless tobacco: Former Neurosurgeon     Types: Snuff  Vaping Use  . Vaping Use: Never used  Substance and Sexual Activity  . Alcohol use: Yes    Alcohol/week: 30.0 standard drinks    Types: 30 Shots of liquor per week    Comment: weekly  . Drug use: Never  . Sexual activity: Not Currently    Birth control/protection: None  Other Topics Concern  . Not on file  Social History Narrative  . Not on file   Social Determinants of Health   Financial Resource Strain: Not on file  Food Insecurity: Not on file  Transportation Needs: Not on file  Physical Activity: Not on file  Stress: Not on file  Social Connections: Not on file  Intimate Partner Violence: Not on file      Family History  Problem Relation Age of Onset  . Diabetes Mellitus II Mother   . Heart failure Maternal Grandmother   . Heart disease Maternal Grandfather     Vitals:   10/02/20 0958  BP: 104/70  Pulse: 68  SpO2: 97%  Weight: (!) 173.3 kg (382 lb)     PHYSICAL EXAM: General: NAD, obese Neck: No JVD, no thyromegaly or thyroid nodule.  Lungs: Clear to auscultation bilaterally with normal respiratory effort. CV: Nondisplaced PMI.  Heart regular S1/S2, no S3/S4, no murmur.  No peripheral edema.  No carotid bruit.  Normal pedal pulses.  Abdomen: Soft, nontender, no hepatosplenomegaly, no distention.  Skin: Intact without lesions or rashes.  Neurologic: Alert and oriented x 3.  Psych: Normal affect. Extremities: No clubbing or cyanosis.  HEENT: Normal.   ASSESSMENT & PLAN:   1. Chronic Systolic CHF: Nonischemic cardiomyopathy.  Echo 11/28/19 was difficult but EF appeared to be 20-25%. Cause is uncertain. However, heavy ETOH may play a role as well as HTN and untreated diabetes. Cannot rule out viral myocarditis. LHC showed no coronary disease. RHC w/low CI at 2.0. Repeat limited Echo 12/12/19 showed slight improvement in LVEF 30-35%, RV systolic function normal.  Echo in 1/22 showed EF 45% with normal RV.  On exam today, he is not volume  overloaded.  NYHA class II symptoms.  - He can continue to use Lasix prn.  - Continue spironolactone 25 mg daily. BMET today.  - Increase Coreg 25 mg bid.  - Continue Entresto 97/103 bid. - Continue dapagliflozin.  - Continue to remain abstinent from ETOH. - EF is out of ICD range currently.  Will repeat echo at followup in 3 months.  2. Bilateral PE: diagnosed 12/13/19. Bilateral LE venous dopplers negative for PE. Limited echo w/ mildly enlarged RV but normal systolic function. Suspect triggered by sedentary lifestyle + CHF.   - He will transition from warfarin to Eliquis 5 mg bid.  3. Type 2 diabetes: Diagnosed  in 7/21 with hgbA1c 11.  - on Dapagliglozin- Metformin 3. OSA: Having trouble with CPAP, has followup with Dr. Mayford Knife.  4. ETOH abuse: Former heavy drinker. Rare use now.  5. Obesity: Continue diet/exercise efforts. Weight coming down.   Followup in 3 months with echo.   Marca Ancona, MD 10/04/20

## 2020-10-13 ENCOUNTER — Telehealth: Payer: Self-pay | Admitting: *Deleted

## 2020-10-13 NOTE — Telephone Encounter (Signed)
-----   Message from Quintella Reichert, MD sent at 10/12/2020 11:35 AM EDT ----- Good AHI on PAP but needs to improve compliance.  Please find out why he is not using his device

## 2020-10-13 NOTE — Telephone Encounter (Signed)
The patient has been notified of the result and verbalized understanding.  All questions (if any) were answered. Latrelle Dodrill, CMA 10/13/2020 11:10 AM.

## 2020-11-08 IMAGING — DX DG CHEST 1V PORT
1 series · 1 of 1 positions shown · non-contrast
Comparison: 12/12/2019, CT chest, 12/13/2019

CLINICAL DATA: Shortness of breath, cough

EXAM:
PORTABLE CHEST 1 VIEW

[chest ap]
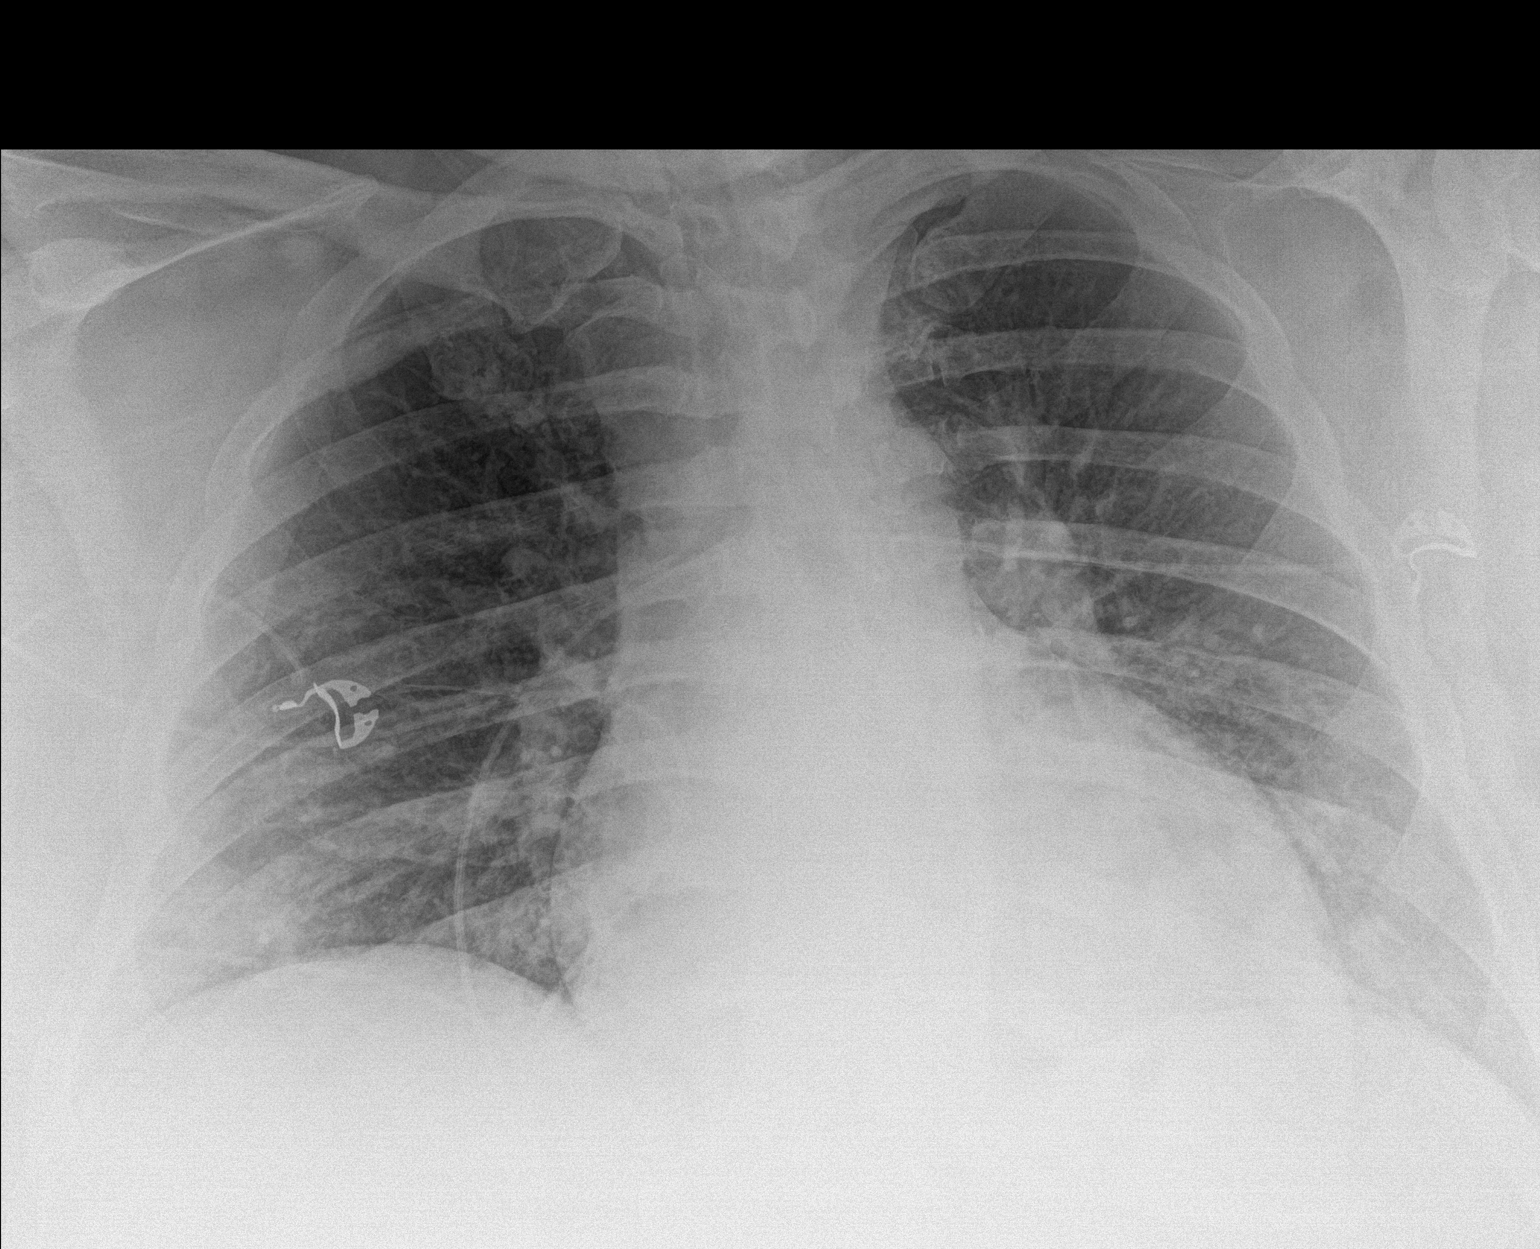

[1 of 1 positions shown; findings below may reference images not displayed]

FINDINGS: Mild cardiomegaly. No significant interval change in subtle
heterogeneous airspace opacities of the bilateral lung bases, better
characterized by prior CT and consistent with pulmonary infarctions.
IMPRESSION: 1.  Mild cardiomegaly.

2. No significant interval change in subtle heterogeneous airspace
opacities of the bilateral lung bases, better characterized by prior
CT and consistent with pulmonary infarctions.

## 2020-11-19 ENCOUNTER — Encounter (HOSPITAL_BASED_OUTPATIENT_CLINIC_OR_DEPARTMENT_OTHER): Payer: 59 | Admitting: Cardiology

## 2020-11-23 ENCOUNTER — Other Ambulatory Visit (HOSPITAL_COMMUNITY): Payer: Self-pay | Admitting: Adult Health

## 2020-12-08 ENCOUNTER — Other Ambulatory Visit (HOSPITAL_COMMUNITY): Payer: Self-pay | Admitting: Adult Health

## 2021-01-06 ENCOUNTER — Ambulatory Visit (HOSPITAL_BASED_OUTPATIENT_CLINIC_OR_DEPARTMENT_OTHER)
Admission: RE | Admit: 2021-01-06 | Discharge: 2021-01-06 | Disposition: A | Discharge status: Home / Self Care | Payer: 59 | Source: Ambulatory Visit | Attending: Cardiology | Admitting: Cardiology

## 2021-01-06 ENCOUNTER — Encounter (HOSPITAL_COMMUNITY): Payer: Self-pay | Admitting: Cardiology

## 2021-01-06 ENCOUNTER — Other Ambulatory Visit: Payer: Self-pay

## 2021-01-06 ENCOUNTER — Ambulatory Visit (HOSPITAL_COMMUNITY)
Admission: RE | Admit: 2021-01-06 | Discharge: 2021-01-06 | Disposition: A | Discharge status: Home / Self Care | Payer: 59 | Source: Ambulatory Visit | Attending: Internal Medicine | Admitting: Internal Medicine

## 2021-01-06 VITALS — BP 100/82 | HR 80 | Wt 382.2 lb

## 2021-01-06 DIAGNOSIS — Z7901 Long term (current) use of anticoagulants: Secondary | ICD-10-CM | POA: Insufficient documentation

## 2021-01-06 DIAGNOSIS — Z7984 Long term (current) use of oral hypoglycemic drugs: Secondary | ICD-10-CM | POA: Insufficient documentation

## 2021-01-06 DIAGNOSIS — G4733 Obstructive sleep apnea (adult) (pediatric): Secondary | ICD-10-CM | POA: Insufficient documentation

## 2021-01-06 DIAGNOSIS — I428 Other cardiomyopathies: Secondary | ICD-10-CM | POA: Insufficient documentation

## 2021-01-06 DIAGNOSIS — Z8249 Family history of ischemic heart disease and other diseases of the circulatory system: Secondary | ICD-10-CM | POA: Insufficient documentation

## 2021-01-06 DIAGNOSIS — Z833 Family history of diabetes mellitus: Secondary | ICD-10-CM | POA: Insufficient documentation

## 2021-01-06 DIAGNOSIS — Z6841 Body Mass Index (BMI) 40.0 and over, adult: Secondary | ICD-10-CM | POA: Diagnosis not present

## 2021-01-06 DIAGNOSIS — I110 Hypertensive heart disease with heart failure: Secondary | ICD-10-CM | POA: Insufficient documentation

## 2021-01-06 DIAGNOSIS — Z86711 Personal history of pulmonary embolism: Secondary | ICD-10-CM | POA: Diagnosis not present

## 2021-01-06 DIAGNOSIS — F1010 Alcohol abuse, uncomplicated: Secondary | ICD-10-CM | POA: Insufficient documentation

## 2021-01-06 DIAGNOSIS — I2699 Other pulmonary embolism without acute cor pulmonale: Secondary | ICD-10-CM | POA: Insufficient documentation

## 2021-01-06 DIAGNOSIS — I5022 Chronic systolic (congestive) heart failure: Secondary | ICD-10-CM

## 2021-01-06 DIAGNOSIS — Z79899 Other long term (current) drug therapy: Secondary | ICD-10-CM | POA: Diagnosis not present

## 2021-01-06 DIAGNOSIS — F1011 Alcohol abuse, in remission: Secondary | ICD-10-CM

## 2021-01-06 DIAGNOSIS — E119 Type 2 diabetes mellitus without complications: Secondary | ICD-10-CM | POA: Diagnosis not present

## 2021-01-06 LAB — BASIC METABOLIC PANEL
Anion gap: 8 (ref 5–15)
BUN: 21 mg/dL — ABNORMAL HIGH (ref 6–20)
CO2: 31 mmol/L (ref 22–32)
Calcium: 9.5 mg/dL (ref 8.9–10.3)
Chloride: 97 mmol/L — ABNORMAL LOW (ref 98–111)
Creatinine, Ser: 1.45 mg/dL — ABNORMAL HIGH (ref 0.61–1.24)
GFR, Estimated: >60 mL/min (ref >60)
Glucose, Bld: 121 mg/dL — ABNORMAL HIGH (ref 70–99)
Potassium: 4.4 mmol/L (ref 3.5–5.1)
Sodium: 136 mmol/L (ref 135–145)

## 2021-01-06 LAB — ECHOCARDIOGRAM COMPLETE
Area-P 1/2: 3.48 cm2
S' Lateral: 3.7 cm

## 2021-01-06 NOTE — Progress Notes (Signed)
[facility] Note   Referring Physician: PCP: Sean Brunette, MD HF Cardiology: Dr. Shirlee Latch  HPI: [name] is a 35 y.o. with a history of HTN, obesity, ETOH abuse, systolic heart failure, PE and diabetes who presents for followup of CHF.     Recently admitted early 7/21 with increased dyspnea and leg edema. ECHO showed reduced EF 20-25%. Diuresed with IV lasix and transitioned to po lasix 80 mg daily. Overall diuresed 35 pounds. Had LHC/RHC that showed normal coronaries and elevated filling pressures, CI 2.0. Also new diagnosis of T2DM, Hgb A1c 11.4. Now on metformin + dapagliflozin. He was discharged home 12/03/19 on GDMT w/ plans to repeat 2D echo in 3 months.   Shortly after discharge, he was readmitted on 12/12/19 by IM for acute bilateral PE, he had been sedentary at work and at home. Was referred to ED by PCP for dyspnea and hypoxia. D-dimer elevated at 6.61. EKG showed sinus tachycardia. CT angiogram of the chest confirmed bilateral pulmonary embolism with mild right heart strain. Repeat limited echo showed RV to be mildly enlarged but systolic function normal. LVEF was 30-35%, mildly improved from previous study. BLE Dopplers negative for DVT. Given BMI of 35, not felt to be ideal candidate for DOAC. He was placed on coumadin w/ heparin bridge.   Echo in 1/22 showed that EF was about 45% with normal RV.   Today he returns for HF follow up with his Mom. Overall feeling fine. Not interested in another sleep study. Denies SOB/PND/Orthopnea. Able to walk up stadium steps without difficulty. Appetite ok. Eating take out more.  No fever or chills. Weight at home 375-380 pounds. Taking all medications and usually takes lasix a few times a week.  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   Labs (10/21): digoxin 0.8, K 4, creatinine 1.45 Labs (1/22): K 4, creatinine 1.05 Labs (09/2020): K 3.8 Creatinine 0.9   PMH: 1. OSA 2. Obesity 3. Prior ETOH  abuse 4. Type 2 diabetes 5. Pulmonary embolus: 7/21, bilateral.  6. Chronic systolic CHF: Nonischemic cardiomyopathy.  - Echo (11/28/19): EF 20-25%, mild LVE, poorly visualized RV.  - RHC/LHC (7/21): No significant CAD, CI 2.0 - Echo (12/13/19): EF 30-35%, normal RV.  - Echo (1/22): EF 45%, normal RV 7. HTN  Review of Systems: All systems reviewed and negative except as per HPI.   Current Outpatient Medications  Medication Sig Dispense Refill   apixaban (ELIQUIS) 5 MG TABS tablet Take 1 tablet (5 mg total) by mouth 2 (two) times daily. 60 tablet 6   carvedilol (COREG) 25 MG tablet Take 1 tablet (25 mg total) by mouth 2 (two) times daily with a meal. 60 tablet 6   Dapagliflozin-metFORMIN HCl ER 09-998 MG TB24 Take 1 tablet by mouth 2 (two) times daily.      furosemide (LASIX) 80 MG tablet Take 1 tablet (80 mg total) by mouth as needed. 30 tablet 3   MAGNESIUM-OXIDE 400 (240 Mg) MG tablet TAKE ONE TABLET BY MOUTH DAILY 30 tablet 3   ONETOUCH VERIO test strip 1 each 2 (two) times daily. Use as directed to check blood sugar twice daily.     pantoprazole (PROTONIX) 40 MG tablet Take 1 tablet (40 mg total) by mouth daily. 30 tablet 0   rosuvastatin (CRESTOR) 5 MG tablet Take 5 mg by mouth daily.     sacubitril-valsartan (ENTRESTO) 97-103 MG Take 1 tablet by mouth 2 (two) times daily. 180 tablet 3  Semaglutide, 1 MG/DOSE, (OZEMPIC, 1 MG/DOSE,) 4 MG/3ML SOPN Inject 2 mg as directed once a week.     spironolactone (ALDACTONE) 25 MG tablet TAKE ONE TABLET BY MOUTH DAILY 30 tablet 6   traMADol (ULTRAM) 50 MG tablet Take by mouth as needed.     No current facility-administered medications for this encounter.    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  Tobacco Use   Smoking status: Never   Smokeless tobacco: Former    Types: Snuff    Quit date: 2015  Vaping Use   Vaping Use: Never used  Substance and Sexual Activity   Alcohol use: Yes    Alcohol/week: 30.0 standard drinks    Types: 30 Shots of liquor per week    Comment: weekly   Drug use: Never   Sexual activity: Not Currently    Birth control/protection: None  Other Topics Concern   Not on file  Social History Narrative   Not on file   Social Determinants of Health   Financial Resource Strain: Not on file  Food Insecurity: Not on file  Transportation Needs: Not on file  Physical Activity: Not on file  Stress: Not on file  Social Connections: Not on file  Intimate Partner Violence: Not on file      Family History  Problem Relation Age of Onset   Diabetes Mellitus II Mother    Heart failure Maternal Grandmother    Heart disease Maternal Grandfather     Vitals:   01/06/21 0901  BP: 100/82  Pulse: 80  SpO2: 97%  Weight: (!) 173.4 kg (382 lb 3.2 oz)    Wt Readings from Last 3 Encounters:  01/06/21 (!) 173.4 kg (382 lb 3.2 oz)  10/02/20 (!) 173.3 kg (382 lb)  08/10/20 (!) 166.9 kg (368 lb)     PHYSICAL EXAM: General:  Well appearing. No resp difficulty. Wlai HEENT: normal Neck: supple. no JVD. Carotids 2+ bilat; no bruits. No lymphadenopathy or thryomegaly appreciated. Cor: PMI nondisplaced. Regular rate & rhythm. No rubs, gallops or murmurs. Lungs: clear Abdomen: obese, soft, nontender, nondistended. No hepatosplenomegaly. No bruits or masses. Good bowel sounds. Extremities: no cyanosis, clubbing, rash, edema Neuro: alert & orientedx3, cranial nerves grossly intact. moves all 4 extremities w/o difficulty. Affect pleasant    ASSESSMENT & PLAN:  1. Chronic Systolic CHF: Nonischemic cardiomyopathy.  Echo 11/28/19 was difficult but EF appeared to be 20-25%. Cause is uncertain. However, heavy ETOH may play a role as well as HTN and  untreated diabetes.  Cannot rule out viral myocarditis. LHC showed no coronary disease. RHC w/low CI at 2.0. Repeat limited Echo 12/12/19 showed slight improvement in LVEF 30-35%, RV systolic function normal.  Echo in 1/22 showed EF 45% with normal RV.   Todays ECHO showed EF 45-50% . Reviewed by Dr Shirlee Latch and I discussed results during office visit.    - NYHA II. Volume status stable. Continue lasix as needed.   - Continue spironolactone 25 mg daily. - Continue Coreg 25 mg bid.  - Continue Entresto 97/103 bid. - Continue dapagliflozin.  - Discussed importance of avoiding alcohol.  - EF is out of ICD range currently.  -  Check BMET  2. Bilateral PE: diagnosed 12/13/19. Bilateral LE venous dopplers negative for PE. Limited echo w/ mildly enlarged RV but normal systolic function. Suspect triggered by sedentary lifestyle + CHF.   - He was transitioned from warfarin to Eliquis 5 mg bid.  - No bleeding issues.  3. Type 2 diabetes: Diagnosed in 7/21 with hgbA1c 11.  --> now down to 5.7 with his PCP - on Dapagliglozin- Metformin 3. OSA: No longer has CPAP and does not want to pursue Having trouble with CPAP, has followup with Dr. Mayford Knife.  4. ETOH abuse: Former heavy drinker. Rare use now.  5. Obesity: Body mass index is 47.77 kg/m. Discussed portion control and exercise.   Discussed ECHO results today. ECHO today with EF 45-50%. He wants to hold off on CPAP/sleep study.    Check BMET and follow up in 3-4 months.   Sean Becket, NP 01/06/21

## 2021-01-06 NOTE — Progress Notes (Signed)
  Echocardiogram 2D Echocardiogram has been performed.  Sean Lawson 01/06/2021, 8:51 AM

## 2021-01-06 NOTE — Patient Instructions (Signed)
Labs done today, your results will be available in MyChart, we will contact you for abnormal readings.  Your physician recommends that you schedule a follow-up appointment in: 3-4 months  Do the following things EVERYDAY: Weigh yourself in the morning before breakfast. Write it down and keep it in a log. Take your medicines as prescribed Eat low salt foods--Limit salt (sodium) to 2000 mg per day.  Stay as active as you can everyday Limit all fluids for the day to less than 2 liters  If you have any questions or concerns before your next appointment please send [place] a message through [place] or call our office at [phone].    TO LEAVE A MESSAGE FOR THE NURSE SELECT OPTION 2, PLEASE LEAVE A MESSAGE INCLUDING: YOUR NAME DATE OF BIRTH CALL BACK NUMBER REASON FOR CALL**this is important as we prioritize the call backs  YOU WILL RECEIVE A CALL BACK THE SAME DAY AS LONG AS YOU CALL BEFORE 4:00 PM  milAt the [facility], you and your health needs are our priority. As part of our continuing mission to provide you with exceptional heart care, we have created designated Provider Care Teams. These Care Teams include your primary Cardiologist (physician) and Advanced Practice Providers (APPs- Physician Assistants and Nurse Practitioners) who all work together to provide you with the care you need, when you need it.   You may see any of the following providers on your designated Care Team at your next follow up: Dr Arvilla Meres Dr Marca Ancona Dr Brandon Melnick, NP Robbie Lis, [place] Mikki Santee Karle Plumber, PharmD   Please be sure to bring in all your medications bottles to every appointment.

## 2021-01-29 ENCOUNTER — Other Ambulatory Visit (HOSPITAL_COMMUNITY): Payer: Self-pay

## 2021-01-29 MED ORDER — SPIRONOLACTONE 25 MG PO TABS
25.0000 mg | ORAL_TABLET | Freq: Every day | ORAL | 6 refills | Status: DC
Start: 2021-01-29 — End: 2022-02-21

## 2021-03-27 ENCOUNTER — Other Ambulatory Visit (HOSPITAL_COMMUNITY): Payer: Self-pay | Admitting: Cardiology

## 2021-03-30 ENCOUNTER — Other Ambulatory Visit (HOSPITAL_COMMUNITY): Payer: Self-pay

## 2021-03-30 MED ORDER — OZEMPIC (2 MG/DOSE) 8 MG/3ML ~~LOC~~ SOPN
PEN_INJECTOR | SUBCUTANEOUS | 5 refills | Status: DC
Start: 2021-03-30 — End: 2021-10-25
  Filled 2021-03-30: qty 3, 30d supply, fill #0

## 2021-04-13 ENCOUNTER — Telehealth: Payer: Self-pay

## 2021-04-13 NOTE — Telephone Encounter (Signed)
Letter has been sent to patient informing them that their sleep study has expired. Patient will need to call and schedule an office visit to re-evaluate the need for a sleep study.    

## 2021-04-26 NOTE — Progress Notes (Signed)
Advanced [facility] Failure Clinic Note    PCP: [name], MD HF Cardiology: Dr. Shirlee Latch  HPI: [name] is a 35 y.o. with a history of HTN, obesity, ETOH abuse, systolic heart failure, PE and diabetes who presents for followup of CHF.     Admitted early 7/21 with increased dyspnea and leg edema. Echo showed reduced EF 20-25%. Diuresed with IV lasix and transitioned to po lasix 80 mg daily. Overall diuresed 35 pounds. Had LHC/RHC that showed normal coronaries and elevated filling pressures, CI 2.0. Also new diagnosis of T2DM, Hgb A1c 11.4. Now on metformin + dapagliflozin. He was discharged home on GDMT w/ plans to repeat 2D echo in 3 months.   Shortly after discharge, he was readmitted on 12/12/19 by IM for acute bilateral PE, he had been sedentary at work and at home. Was referred to ED by PCP for dyspnea and hypoxia. D-dimer elevated at 6.61. EKG showed sinus tachycardia. CT angiogram of the chest confirmed bilateral pulmonary embolism with mild right heart strain. Repeat limited echo showed RV to be mildly enlarged but systolic function normal. LVEF was 30-35%, mildly improved from previous study. BLE Dopplers negative for DVT. Given BMI of 35, not felt to be ideal candidate for DOAC. He was placed on Coumadin w/ heparin bridge.   Echo in 1/22 showed that EF was about 45% with normal RV.   Echo 8/22 showed EF 45-50%, RV normal function mildly enlarged.  Today he returns for HF follow up. Overall feeling fine. He does not have significant exertional dyspnea. Denies abnormal bleeding, palpitations, CP, dizziness, edema, or PND/Orthopnea. Appetite ok. No fever or chills. Weight at home 387 pounds. Taking all medications. Took Lasix about 4x the week after Thanksgiving. Unable to tolerate CPAP. Tries to meal prep at home and eat out less. 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 ETOH 2-3x/week.  ECG (personally reviewed): SR AVB PR 210 msec  Labs  (10/21): digoxin 0.8, K 4, creatinine 1.45 Labs (1/22): K 4, creatinine 1.05 Labs (09/2020): K 3.8 Creatinine 0.9  Labs (8/22): K 4.4, creatinine 1.45  PMH: 1. OSA 2. Obesity 3. Prior ETOH abuse 4. Type 2 diabetes 5. Pulmonary embolus: 7/21, bilateral.  6. Chronic systolic CHF: Nonischemic cardiomyopathy.  - Echo (11/28/19): EF 20-25%, mild LVE, poorly visualized RV.  - RHC/LHC (7/21): No significant CAD, CI 2.0 - Echo (12/13/19): EF 30-35%, normal RV.  - Echo (1/22): EF 45%, normal RV - Echo (8/22) EF 45-50% (per Dr. Alford Lawson read) RV mildly enlarged 7. HTN  Review of Systems: All systems reviewed and negative except as per HPI.   Current Outpatient Medications  Medication Sig Dispense Refill   apixaban (ELIQUIS) 5 MG TABS tablet Take 1 tablet (5 mg total) by mouth 2 (two) times daily. 60 tablet 6   carvedilol (COREG) 25 MG tablet Take 1 tablet (25 mg total) by mouth 2 (two) times daily with a meal. 60 tablet 6   Dapagliflozin-metFORMIN HCl ER 09-998 MG TB24 Take 1 tablet by mouth 2 (two) times daily.      ENTRESTO 97-103 MG TAKE ONE TABLET BY MOUTH TWICE A DAY 60 tablet 11   furosemide (LASIX) 80 MG tablet Take 1 tablet (80 mg total) by mouth as needed. 30 tablet 3   MAGNESIUM-OXIDE 400 (240 Mg) MG tablet TAKE ONE TABLET BY MOUTH DAILY 30 tablet 3   ONETOUCH VERIO test strip 1 each 2 (two) times daily. Use as directed to check  blood sugar twice daily.     pantoprazole (PROTONIX) 40 MG tablet Take 1 tablet (40 mg total) by mouth daily. 30 tablet 0   rosuvastatin (CRESTOR) 5 MG tablet Take 5 mg by mouth daily.     Semaglutide, 2 MG/DOSE, (OZEMPIC, 2 MG/DOSE,) 8 MG/3ML SOPN Inject 2 mg into the skin once a week 3 mL 5   spironolactone (ALDACTONE) 25 MG tablet Take 1 tablet (25 mg total) by mouth daily. 30 tablet 6   No current facility-administered medications for this encounter.   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  Tobacco Use   Smoking status: Never   Smokeless tobacco: Former    Types: Snuff    Quit date: 2015  Vaping Use   Vaping Use: Never used  Substance and Sexual Activity   Alcohol use: Yes    Alcohol/week: 30.0 standard drinks    Types: 30 Shots of liquor per week    Comment: weekly   Drug use: Never   Sexual activity: Not Currently    Birth control/protection: None  Other Topics Concern   Not on file  Social History Narrative   Not on file   Social Determinants of Health   Financial Resource Strain: Not on file  Food Insecurity: Not on file  Transportation Needs: Not on file  Physical Activity: Not on file  Stress: Not on file  Social Connections: Not on file  Intimate Partner Violence: Not on file   Family History  Problem Relation Age of Onset   Diabetes Mellitus II Mother    Heart failure Maternal Grandmother    Heart disease Maternal Grandfather    BP 100/60   Pulse 73   Wt (!) 172.7 kg (380 lb 12.8 oz)   SpO2 99%   BMI 47.60 kg/m   Wt Readings from Last 3 Encounters:  04/27/21 (!) 172.7 kg (380 lb 12.8 oz)  01/06/21 (!) 173.4 kg (382 lb 3.2 oz)  10/02/20 (!) 173.3 kg (382 lb)   PHYSICAL EXAM: General:  NAD. No resp difficulty HEENT: Normal Neck: Supple. Thick neck. Carotids 2+ bilat; no bruits. No lymphadenopathy or thryomegaly appreciated. Cor: PMI nondisplaced. Regular rate & rhythm. No rubs, gallops or murmurs. Lungs: Clear Abdomen: Obese, nontender, nondistended. No hepatosplenomegaly. No bruits or masses. Good bowel sounds. Extremities: No cyanosis, clubbing, rash, edema Neuro: Alert & oriented x 3, cranial nerves grossly intact. Moves all 4 extremities w/o difficulty. Affect pleasant.    ASSESSMENT & PLAN:  1. Chronic Systolic CHF:  Nonischemic cardiomyopathy.  Echo 11/28/19 was difficult but EF appeared to be 20-25%. Cause is uncertain. However, heavy ETOH may play a role as well as HTN and untreated diabetes.  Cannot rule out viral myocarditis. LHC showed no coronary disease. RHC w/low CI at 2.0. Repeat limited Echo 12/12/19 showed slight improvement in LVEF 30-35%, RV systolic function normal.  Echo in 1/22 showed EF 45% with normal RV.  Echo 8/22 showed EF 45-50% (on Dr. Alford Lawson read). - Stable NYHA II. Volume status stable. Continue lasix as needed.   - Continue spironolactone 25 mg daily. BMET today. - Continue Coreg 25 mg bid.  - Continue Entresto 97/103 bid. - Continue dapagliflozin.  -  Discussed importance of avoiding alcohol.  - EF is out of ICD range currently.  2. Bilateral PE: diagnosed 12/13/19. Bilateral LE venous dopplers negative for PE. Limited echo w/ mildly enlarged RV but normal systolic function. Suspect triggered by sedentary lifestyle + CHF.   - He was transitioned from warfarin to Eliquis 5 mg bid.  - No bleeding issues. CBC today. 3. Type 2 diabetes: Diagnosed in 7/21 with hgbA1c 11. ---> now down to 5.7 with his PCP - On Dapagliglozin- Metformin 3. OSA: No longer has CPAP and does not want to pursue further testing. Has O2 at home he sometimes wears at night when needed. 4. ETOH abuse: Former heavy drinker. Has cut down. 5. Obesity: Body mass index is 47.6 kg/m. Encouraged continued exercise and meal prep.  - Continue Ozempic.  Follow up in 4 months with Dr. Park Liter, FNP 04/27/21

## 2021-04-27 ENCOUNTER — Ambulatory Visit (HOSPITAL_COMMUNITY)
Admission: RE | Admit: 2021-04-27 | Discharge: 2021-04-27 | Disposition: A | Discharge status: Home / Self Care | Payer: 59 | Source: Ambulatory Visit | Attending: Family Medicine | Admitting: Family Medicine

## 2021-04-27 ENCOUNTER — Other Ambulatory Visit: Payer: Self-pay

## 2021-04-27 ENCOUNTER — Encounter (HOSPITAL_COMMUNITY): Payer: Self-pay

## 2021-04-27 VITALS — BP 100/60 | HR 73 | Wt 380.8 lb

## 2021-04-27 DIAGNOSIS — Z86711 Personal history of pulmonary embolism: Secondary | ICD-10-CM | POA: Insufficient documentation

## 2021-04-27 DIAGNOSIS — Z87891 Personal history of nicotine dependence: Secondary | ICD-10-CM | POA: Insufficient documentation

## 2021-04-27 DIAGNOSIS — I110 Hypertensive heart disease with heart failure: Secondary | ICD-10-CM | POA: Insufficient documentation

## 2021-04-27 DIAGNOSIS — E669 Obesity, unspecified: Secondary | ICD-10-CM | POA: Insufficient documentation

## 2021-04-27 DIAGNOSIS — Z7984 Long term (current) use of oral hypoglycemic drugs: Secondary | ICD-10-CM | POA: Diagnosis not present

## 2021-04-27 DIAGNOSIS — I5022 Chronic systolic (congestive) heart failure: Secondary | ICD-10-CM | POA: Diagnosis present

## 2021-04-27 DIAGNOSIS — I428 Other cardiomyopathies: Secondary | ICD-10-CM | POA: Insufficient documentation

## 2021-04-27 DIAGNOSIS — F1011 Alcohol abuse, in remission: Secondary | ICD-10-CM

## 2021-04-27 DIAGNOSIS — E119 Type 2 diabetes mellitus without complications: Secondary | ICD-10-CM | POA: Diagnosis not present

## 2021-04-27 DIAGNOSIS — Z794 Long term (current) use of insulin: Secondary | ICD-10-CM

## 2021-04-27 DIAGNOSIS — Z6841 Body Mass Index (BMI) 40.0 and over, adult: Secondary | ICD-10-CM | POA: Insufficient documentation

## 2021-04-27 DIAGNOSIS — Z7901 Long term (current) use of anticoagulants: Secondary | ICD-10-CM | POA: Diagnosis not present

## 2021-04-27 DIAGNOSIS — F1010 Alcohol abuse, uncomplicated: Secondary | ICD-10-CM | POA: Diagnosis not present

## 2021-04-27 DIAGNOSIS — G4733 Obstructive sleep apnea (adult) (pediatric): Secondary | ICD-10-CM | POA: Diagnosis not present

## 2021-04-27 LAB — CBC
HCT: 46.6 % (ref 39.0–52.0)
Hemoglobin: 16.2 g/dL (ref 13.0–17.0)
MCH: 30.6 pg (ref 26.0–34.0)
MCHC: 34.8 g/dL (ref 30.0–36.0)
MCV: 88.1 fL (ref 80.0–100.0)
Platelets: 254 10*3/uL (ref 150–400)
RBC: 5.29 MIL/uL (ref 4.22–5.81)
RDW: 13.2 % (ref 11.5–15.5)
WBC: 8.5 10*3/uL (ref 4.0–10.5)
nRBC: 0 % (ref 0.0–0.2)

## 2021-04-27 LAB — BASIC METABOLIC PANEL
Anion gap: 12 (ref 5–15)
BUN: 18 mg/dL (ref 6–20)
CO2: 26 mmol/L (ref 22–32)
Calcium: 9.4 mg/dL (ref 8.9–10.3)
Chloride: 95 mmol/L — ABNORMAL LOW (ref 98–111)
Creatinine, Ser: 1.18 mg/dL (ref 0.61–1.24)
GFR, Estimated: >60 mL/min (ref >60)
Glucose, Bld: 109 mg/dL — ABNORMAL HIGH (ref 70–99)
Potassium: 3.6 mmol/L (ref 3.5–5.1)
Sodium: 133 mmol/L — ABNORMAL LOW (ref 135–145)

## 2021-04-27 NOTE — Patient Instructions (Signed)
It was great to see you today! No medication changes are needed at this time.  Labs today We will only contact you if something comes back abnormal or we need to make some changes. Otherwise no news is good news!  Your physician recommends that you schedule a follow-up appointment in: 4 months with Dr Shirlee Latch  Do the following things EVERYDAY: Weigh yourself in the morning before breakfast. Write it down and keep it in a log. Take your medicines as prescribed Eat low salt foods--Limit salt (sodium) to 2000 mg per day.  Stay as active as you can everyday Limit all fluids for the day to less than 2 liters  At the [facility], you and your health needs are our priority. As part of our continuing mission to provide you with exceptional heart care, we have created designated Provider Care Teams. These Care Teams include your primary Cardiologist (physician) and Advanced Practice Providers (APPs- Physician Assistants and Nurse Practitioners) who all work together to provide you with the care you need, when you need it.   You may see any of the following providers on your designated Care Team at your next follow up: Dr Arvilla Meres Dr Carron Curie, NP Robbie Lis, [place] [facility] [place], [place] Karle Plumber, PharmD   Please be sure to bring in all your medications bottles to every appointment.   If you have any questions or concerns before your next appointment please send [place] a message through [place] or call our office at [phone].    TO LEAVE A MESSAGE FOR THE NURSE SELECT OPTION 2, PLEASE LEAVE A MESSAGE INCLUDING: YOUR NAME DATE OF BIRTH CALL BACK NUMBER REASON FOR CALL**this is important as we prioritize the call backs  YOU WILL RECEIVE A CALL BACK THE SAME DAY AS LONG AS YOU CALL BEFORE 4:00 PM

## 2021-04-30 ENCOUNTER — Other Ambulatory Visit (HOSPITAL_COMMUNITY): Payer: Self-pay

## 2021-04-30 MED ORDER — OZEMPIC (2 MG/DOSE) 8 MG/3ML ~~LOC~~ SOPN
PEN_INJECTOR | SUBCUTANEOUS | 3 refills | Status: DC
Start: 2021-04-29 — End: 2021-10-25
  Filled 2021-04-30: qty 3, 28d supply, fill #0

## 2021-05-03 ENCOUNTER — Telehealth: Payer: Self-pay

## 2021-05-03 NOTE — Telephone Encounter (Signed)
Letter has been sent to patient instructing them to call us if they are still interested in completing their sleep study. If we have not received a response from the patient within 30 days of this notice, the order will be cancelled and they will need to discuss the need for a sleep study at their next office visit.  ° °

## 2021-05-19 ENCOUNTER — Other Ambulatory Visit (HOSPITAL_COMMUNITY): Payer: Self-pay

## 2021-05-19 MED ORDER — FUROSEMIDE 80 MG PO TABS
80.0000 mg | ORAL_TABLET | ORAL | 3 refills | Status: DC | PRN
Start: 2021-05-19 — End: 2021-05-31

## 2021-05-31 ENCOUNTER — Other Ambulatory Visit: Payer: Self-pay

## 2021-05-31 MED ORDER — FUROSEMIDE 80 MG PO TABS: 80.0000 mg | ORAL_TABLET | ORAL | 3 refills | Status: DC | PRN

## 2021-06-23 ENCOUNTER — Telehealth (HOSPITAL_COMMUNITY): Payer: Self-pay | Admitting: Cardiology

## 2021-06-23 NOTE — Telephone Encounter (Signed)
Pts mother called to request a d/c order be placed for home oxygen. Pt was originally on it as needed at night, he hasn't used oxygen  in 3-4 months, co pay will now be 116 dollars per month until deductible is met. Requests to stop  Will confirm with provider and give verbal to [facility]

## 2021-06-23 NOTE — Telephone Encounter (Signed)
Ok to stop

## 2021-06-24 NOTE — Telephone Encounter (Signed)
Pt aware and order faxed [facility]

## 2021-07-14 ENCOUNTER — Other Ambulatory Visit (HOSPITAL_COMMUNITY): Payer: Self-pay

## 2021-07-14 MED ORDER — MAGNESIUM OXIDE -MG SUPPLEMENT 400 (240 MG) MG PO TABS: 1.0000 | ORAL_TABLET | Freq: Every day | ORAL | 2 refills | Status: DC

## 2021-08-18 ENCOUNTER — Encounter (HOSPITAL_COMMUNITY): Payer: 59 | Admitting: Cardiology

## 2021-08-21 ENCOUNTER — Other Ambulatory Visit (HOSPITAL_COMMUNITY): Payer: Self-pay | Admitting: Cardiology

## 2021-09-14 ENCOUNTER — Other Ambulatory Visit (HOSPITAL_COMMUNITY): Payer: Self-pay | Admitting: Cardiology

## 2021-10-25 ENCOUNTER — Encounter (HOSPITAL_COMMUNITY): Payer: Self-pay | Admitting: Cardiology

## 2021-10-25 ENCOUNTER — Ambulatory Visit (HOSPITAL_COMMUNITY)
Admission: RE | Admit: 2021-10-25 | Discharge: 2021-10-25 | Disposition: A | Discharge status: Home / Self Care | Payer: 59 | Source: Ambulatory Visit | Attending: Cardiology | Admitting: Cardiology

## 2021-10-25 VITALS — BP 90/50 | HR 85 | Wt >= 6400 oz

## 2021-10-25 DIAGNOSIS — I110 Hypertensive heart disease with heart failure: Secondary | ICD-10-CM | POA: Diagnosis not present

## 2021-10-25 DIAGNOSIS — F1010 Alcohol abuse, uncomplicated: Secondary | ICD-10-CM | POA: Diagnosis not present

## 2021-10-25 DIAGNOSIS — Z79899 Other long term (current) drug therapy: Secondary | ICD-10-CM | POA: Diagnosis not present

## 2021-10-25 DIAGNOSIS — G4733 Obstructive sleep apnea (adult) (pediatric): Secondary | ICD-10-CM | POA: Insufficient documentation

## 2021-10-25 DIAGNOSIS — I5022 Chronic systolic (congestive) heart failure: Secondary | ICD-10-CM

## 2021-10-25 DIAGNOSIS — Z9989 Dependence on other enabling machines and devices: Secondary | ICD-10-CM | POA: Insufficient documentation

## 2021-10-25 DIAGNOSIS — E119 Type 2 diabetes mellitus without complications: Secondary | ICD-10-CM | POA: Insufficient documentation

## 2021-10-25 DIAGNOSIS — Z7985 Long-term (current) use of injectable non-insulin antidiabetic drugs: Secondary | ICD-10-CM | POA: Diagnosis not present

## 2021-10-25 DIAGNOSIS — Z7901 Long term (current) use of anticoagulants: Secondary | ICD-10-CM | POA: Insufficient documentation

## 2021-10-25 DIAGNOSIS — Z794 Long term (current) use of insulin: Secondary | ICD-10-CM | POA: Insufficient documentation

## 2021-10-25 DIAGNOSIS — I428 Other cardiomyopathies: Secondary | ICD-10-CM | POA: Insufficient documentation

## 2021-10-25 DIAGNOSIS — Z7902 Long term (current) use of antithrombotics/antiplatelets: Secondary | ICD-10-CM | POA: Insufficient documentation

## 2021-10-25 DIAGNOSIS — Z7984 Long term (current) use of oral hypoglycemic drugs: Secondary | ICD-10-CM | POA: Diagnosis not present

## 2021-10-25 DIAGNOSIS — E669 Obesity, unspecified: Secondary | ICD-10-CM | POA: Insufficient documentation

## 2021-10-25 DIAGNOSIS — I2699 Other pulmonary embolism without acute cor pulmonale: Secondary | ICD-10-CM | POA: Insufficient documentation

## 2021-10-25 LAB — BASIC METABOLIC PANEL
Anion gap: 13 (ref 5–15)
BUN: 25 mg/dL — ABNORMAL HIGH (ref 6–20)
CO2: 23 mmol/L (ref 22–32)
Calcium: 9.4 mg/dL (ref 8.9–10.3)
Chloride: 98 mmol/L (ref 98–111)
Creatinine, Ser: 1.4 mg/dL — ABNORMAL HIGH (ref 0.61–1.24)
GFR, Estimated: >60 mL/min (ref >60)
Glucose, Bld: 140 mg/dL — ABNORMAL HIGH (ref 70–99)
Potassium: 3.5 mmol/L (ref 3.5–5.1)
Sodium: 134 mmol/L — ABNORMAL LOW (ref 135–145)

## 2021-10-25 LAB — CBC
HCT: 45.4 % (ref 39.0–52.0)
Hemoglobin: 15.9 g/dL (ref 13.0–17.0)
MCH: 30.7 pg (ref 26.0–34.0)
MCHC: 35 g/dL (ref 30.0–36.0)
MCV: 87.6 fL (ref 80.0–100.0)
Platelets: 276 10*3/uL (ref 150–400)
RBC: 5.18 MIL/uL (ref 4.22–5.81)
RDW: 13.2 % (ref 11.5–15.5)
WBC: 8.7 10*3/uL (ref 4.0–10.5)
nRBC: 0 % (ref 0.0–0.2)

## 2021-10-25 LAB — BRAIN NATRIURETIC PEPTIDE: B Natriuretic Peptide: 10 pg/mL (ref 0.0–100.0)

## 2021-10-25 NOTE — Progress Notes (Signed)
Advanced Heart Failure Clinic Note   Referring Physician: PCP: Sean Brunette, MD HF Cardiology: Dr. Shirlee Latch  HPI:  Sean Lawson is a 36 y.o. with a history of HTN, obesity, ETOH abuse, systolic heart failure, PE and diabetes who presents for followup of CHF.     Recently admitted early 7/21 with increased dyspnea and leg edema. ECHO showed reduced EF 20-25%. Diuresed with IV lasix and transitioned to po lasix 80 mg daily. Overall diuresed 35 pounds. Had LHC/RHC that showed normal coronaries and elevated filling pressures, CI 2.0. Also new diagnosis of T2DM, Hgb A1c 11.4. Now on metformin + dapagliflozin. He was discharged home 12/03/19 on GDMT w/ plans to repeat 2D echo in 3 months.   Shortly after discharge, he was readmitted on 12/12/19 by IM for acute bilateral PE, he had been sedentary at work and at home. Was referred to ED by PCP for dyspnea and hypoxia. D-dimer elevated at 6.61. EKG showed sinus tachycardia. CT angiogram of the chest confirmed bilateral pulmonary embolism with mild right heart strain. Repeat limited echo showed RV to be mildly enlarged but systolic function normal. LVEF was 30-35%, mildly improved from previous study. BLE Dopplers negative for DVT. Given BMI of 35, not felt to be ideal candidate for DOAC. He was placed on coumadin w/ heparin bridge.   Echo in 1/22 showed that EF was about 45% with normal RV. Echo in 8/22 with EF up to 55-60%, normal RV.   Patient's weight is up 25 lbs despite taking tirzepatide.  He reports to dietary indiscretion recently, especially on a trip to [facility] last week.  He drank fairly heavily last week also (prior, had been drinking only rarely).  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  No significant exertional dyspnea or chest pain.  No lightheadedness.  No palpitations.  No orthopnea/paroxysmal nocturnal dyspnea.  No BRBPR/melena.  He takes Lasix 3-4 times/week.   Labs (10/21): digoxin 0.8, K 4, creatinine 1.45 Labs (1/22): K 4,  creatinine 1.05 Labs (12/22): K 3.6, creatinine 1.18  PMH: 1. OSA 2. Obesity 3. Prior ETOH abuse 4. Type 2 diabetes 5. Pulmonary embolus: 7/21, bilateral.  6. Chronic systolic CHF: Nonischemic cardiomyopathy.  - Echo (11/28/19): EF 20-25%, mild LVE, poorly visualized RV.  - RHC/LHC (7/21): No significant CAD, CI 2.0 - Echo (12/13/19): EF 30-35%, normal RV.  - Echo (1/22): EF 45%, normal RV - Echo (8/22): EF 55-60%, normal RV.  7. HTN  Review of Systems: All systems reviewed and negative except as per HPI.   Current Outpatient Medications  Medication Sig Dispense Refill   carvedilol (COREG) 25 MG tablet TAKE ONE TABLET BY MOUTH TWICE A DAY WITH A MEAL 60 tablet 6   Dapagliflozin-metFORMIN HCl ER 09-998 MG TB24 Take 1 tablet by mouth 2 (two) times daily.      ELIQUIS 5 MG TABS tablet TAKE ONE TABLET BY MOUTH TWICE A DAY *STOP COUMADIN* 60 tablet 6   ENTRESTO 97-103 MG TAKE ONE TABLET BY MOUTH TWICE A DAY 60 tablet 11   furosemide (LASIX) 80 MG tablet Take 1 tablet (80 mg total) by mouth as needed. 30 tablet 3   MAGnesium-Oxide 400 (240 Mg) MG tablet Take 1 tablet (400 mg total) by mouth daily. 30 tablet 2   ONETOUCH VERIO test strip 1 each 2 (two) times daily. Use as directed to check blood sugar twice daily.     pantoprazole (PROTONIX) 40 MG tablet Take 1 tablet (40 mg total) by mouth  daily. 30 tablet 0   rosuvastatin (CRESTOR) 5 MG tablet Take 5 mg by mouth daily.     spironolactone (ALDACTONE) 25 MG tablet Take 1 tablet (25 mg total) by mouth daily. 30 tablet 6   tirzepatide (MOUNJARO) 7.5 MG/0.5ML Pen Inject 7.5 mg into the skin once a week.     No current facility-administered medications for this encounter.    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  Tobacco Use   Smoking status: Never   Smokeless tobacco: Former    Types: Snuff    Quit date: 2015  Vaping Use   Vaping Use: Never used  Substance and Sexual Activity   Alcohol use: Yes    Alcohol/week: 30.0 standard drinks    Types: 30 Shots of liquor per week    Comment: weekly   Drug use: Never   Sexual activity: Not Currently    Birth control/protection: None  Other Topics Concern   Not on file  Social History Narrative   Not on file   Social Determinants of Health   Financial Resource Strain: Not on file  Food Insecurity: Not on file  Transportation Needs: Not on file  Physical Activity: Not on file  Stress: Not on file  Social Connections: Not on file  Intimate Partner Violence: Not on file      Family History  Problem Relation Age of Onset   Diabetes Mellitus II Mother    Heart failure Maternal Grandmother    Heart disease Maternal Grandfather     Vitals:   10/25/21 1419  BP: (!) 90/50  Pulse: 85  SpO2: 95%  Weight: (!) 183.8 kg (405 lb 3.2 oz)     PHYSICAL EXAM: General: NAD, obese.  Neck: No JVD, no thyromegaly or thyroid nodule.  Lungs: Clear to auscultation bilaterally with normal respiratory effort. CV: Nondisplaced PMI.  Heart regular S1/S2, no S3/S4, no murmur.  No peripheral edema.  No carotid bruit.  Normal pedal pulses.  Abdomen: Soft, nontender, no hepatosplenomegaly, no distention.  Skin: Intact without lesions or rashes.  Neurologic: Alert and oriented x 3.  Psych: Normal affect. Extremities: No clubbing or cyanosis.  HEENT: Normal.   ASSESSMENT & PLAN:   1. Chronic Systolic CHF: Nonischemic cardiomyopathy.  Echo 11/28/19 was difficult but EF appeared to be 20-25%. Cause is uncertain. However, heavy ETOH may play a role as well as HTN and untreated diabetes.  Cannot rule out viral myocarditis. LHC showed no coronary disease. RHC w/low CI at 2.0. Repeat limited Echo 12/12/19 showed  slight improvement in LVEF 30-35%, RV systolic function normal.  Echo in 1/22 showed EF 45% with normal RV.  Echo in 8/22 with EF back up to 55-60%.  On exam today, he is not volume overloaded.  NYHA class II symptoms.  Weight gain is likely caloric.  I do worry about LV function now that he has started drinking ETOH again.  - I asked him to stop ETOH.   - He can continue to use Lasix prn.  - Continue spironolactone 25 mg daily. BMET today.  - Continue Coreg 25 mg bid.  - Continue Entresto 97/103 bid. - Continue dapagliflozin.  - I will  arrange for repeat echo at followup in 4 months.   2. Bilateral PE: diagnosed 12/13/19. Bilateral LE venous dopplers negative for PE. Limited echo w/ mildly enlarged RV but normal systolic function. Suspect triggered by sedentary lifestyle + CHF.   - Continue Eliquis 5 mg bid. CBC today.  3. Type 2 diabetes: Diagnosed in 7/21 with hgbA1c 11.   - on Dapagliglozin- Metformin - Continue tirzepatide.  3. OSA: Use CPAP.   4. ETOH abuse: Drank heavily last week at the beach.  Needs to stop again.  5. Obesity: Weight is up with dietary indiscretion.   - We discussed better dietary control.  - He will continue tirzepatide.   Followup in 4 months with echo.   Marca Ancona, MD 10/25/21

## 2021-10-25 NOTE — Patient Instructions (Signed)
There has been no changes to your medications.  Labs done today, your results will be available in MyChart, we will contact you for abnormal readings.  Your physician has requested that you have an echocardiogram. Echocardiography is a painless test that uses sound waves to create images of your heart. It provides your doctor with information about the size and shape of your heart and how well your heart's chambers and valves are working. This procedure takes approximately one hour. There are no restrictions for this procedure.  Your physician recommends that you schedule a follow-up appointment in: 4 months (October 2023)  **PLEASE CALL THE OFFICE IN [place] TO ARRANGE YOUR FOLLOW UP APPOINTMENT**  If you have any questions or concerns before your next appointment please send [place] a message through [place] or call our office at [phone].    TO LEAVE A MESSAGE FOR THE NURSE SELECT OPTION 2, PLEASE LEAVE A MESSAGE INCLUDING: YOUR NAME DATE OF BIRTH CALL BACK NUMBER REASON FOR CALL**this is important as we prioritize the call backs  YOU WILL RECEIVE A CALL BACK THE SAME DAY AS LONG AS YOU CALL BEFORE 4:00 PM  At the [facility], you and your health needs are our priority. As part of our continuing mission to provide you with exceptional heart care, we have created designated Provider Care Teams. These Care Teams include your primary Cardiologist (physician) and Advanced Practice Providers (APPs- Physician Assistants and Nurse Practitioners) who all work together to provide you with the care you need, when you need it.   You may see any of the following providers on your designated Care Team at your next follow up: Dr Arvilla Meres Dr Carron Curie, NP Robbie Lis, [place] [facility] [place], [place] Karle Plumber, PharmD   Please be sure to bring in all your medications bottles to every appointment.

## 2021-11-25 ENCOUNTER — Other Ambulatory Visit (HOSPITAL_COMMUNITY): Payer: Self-pay | Admitting: Adult Health

## 2021-11-29 ENCOUNTER — Other Ambulatory Visit (HOSPITAL_COMMUNITY): Payer: Self-pay

## 2021-11-30 ENCOUNTER — Other Ambulatory Visit (HOSPITAL_COMMUNITY): Payer: Self-pay

## 2021-12-01 ENCOUNTER — Other Ambulatory Visit (HOSPITAL_COMMUNITY): Payer: Self-pay

## 2021-12-01 MED ORDER — MOUNJARO 12.5 MG/0.5ML ~~LOC~~ SOAJ
SUBCUTANEOUS | 0 refills | Status: DC
  Filled 2021-12-01: qty 4, 28d supply, fill #0
  Filled 2021-12-03: qty 2, 28d supply, fill #0

## 2021-12-03 ENCOUNTER — Other Ambulatory Visit (HOSPITAL_COMMUNITY): Payer: Self-pay

## 2022-02-21 ENCOUNTER — Other Ambulatory Visit: Payer: Self-pay | Admitting: Cardiology

## 2022-02-21 ENCOUNTER — Other Ambulatory Visit (HOSPITAL_COMMUNITY): Payer: Self-pay | Admitting: Adult Health

## 2022-03-16 ENCOUNTER — Ambulatory Visit (HOSPITAL_COMMUNITY)
Admission: RE | Admit: 2022-03-16 | Discharge: 2022-03-16 | Disposition: A | Discharge status: Home / Self Care | Payer: 59 | Source: Ambulatory Visit | Attending: Cardiology | Admitting: Cardiology

## 2022-03-16 ENCOUNTER — Ambulatory Visit (HOSPITAL_BASED_OUTPATIENT_CLINIC_OR_DEPARTMENT_OTHER)
Admission: RE | Admit: 2022-03-16 | Discharge: 2022-03-16 | Disposition: A | Discharge status: Home / Self Care | Payer: 59 | Source: Ambulatory Visit | Attending: Cardiology | Admitting: Cardiology

## 2022-03-16 VITALS — BP 130/82 | HR 65 | Wt 391.0 lb

## 2022-03-16 DIAGNOSIS — G4733 Obstructive sleep apnea (adult) (pediatric): Secondary | ICD-10-CM | POA: Diagnosis not present

## 2022-03-16 DIAGNOSIS — Z7901 Long term (current) use of anticoagulants: Secondary | ICD-10-CM | POA: Diagnosis not present

## 2022-03-16 DIAGNOSIS — I110 Hypertensive heart disease with heart failure: Secondary | ICD-10-CM | POA: Diagnosis not present

## 2022-03-16 DIAGNOSIS — F1010 Alcohol abuse, uncomplicated: Secondary | ICD-10-CM | POA: Insufficient documentation

## 2022-03-16 DIAGNOSIS — I5022 Chronic systolic (congestive) heart failure: Secondary | ICD-10-CM

## 2022-03-16 DIAGNOSIS — I2699 Other pulmonary embolism without acute cor pulmonale: Secondary | ICD-10-CM | POA: Insufficient documentation

## 2022-03-16 DIAGNOSIS — E669 Obesity, unspecified: Secondary | ICD-10-CM | POA: Insufficient documentation

## 2022-03-16 DIAGNOSIS — Z6835 Body mass index (BMI) 35.0-35.9, adult: Secondary | ICD-10-CM | POA: Insufficient documentation

## 2022-03-16 DIAGNOSIS — I428 Other cardiomyopathies: Secondary | ICD-10-CM | POA: Insufficient documentation

## 2022-03-16 DIAGNOSIS — I779 Disorder of arteries and arterioles, unspecified: Secondary | ICD-10-CM | POA: Diagnosis not present

## 2022-03-16 DIAGNOSIS — Z79899 Other long term (current) drug therapy: Secondary | ICD-10-CM | POA: Diagnosis not present

## 2022-03-16 DIAGNOSIS — E119 Type 2 diabetes mellitus without complications: Secondary | ICD-10-CM | POA: Insufficient documentation

## 2022-03-16 LAB — ECHOCARDIOGRAM COMPLETE
Area-P 1/2: 3.56 cm2
S' Lateral: 4.2 cm

## 2022-03-16 MED ORDER — PERFLUTREN LIPID MICROSPHERE
1.0000 mL | INTRAVENOUS | Status: DC | PRN
  Administered 2022-03-16: 2 mL via INTRAVENOUS

## 2022-03-16 NOTE — Progress Notes (Signed)
Advanced [facility] Failure Clinic Note   Referring Physician: PCP: [name], MD HF Cardiology: Dr. Shirlee Latch  HPI:  Mr Blackburn is a 36 y.o. with a history of HTN, obesity, ETOH abuse, systolic heart failure, PE and diabetes who presents for followup of CHF.     Recently admitted early 7/21 with increased dyspnea and leg edema. ECHO showed reduced EF 20-25%. Diuresed with IV lasix and transitioned to po lasix 80 mg daily. Overall diuresed 35 pounds. Had LHC/RHC that showed normal coronaries and elevated filling pressures, CI 2.0. Also new diagnosis of T2DM, Hgb A1c 11.4. Now on metformin + dapagliflozin. He was discharged home 12/03/19 on GDMT w/ plans to repeat 2D echo in 3 months.   Shortly after discharge, he was readmitted on 12/12/19 by IM for acute bilateral PE, he had been sedentary at work and at home. Was referred to ED by PCP for dyspnea and hypoxia. D-dimer elevated at 6.61. EKG showed sinus tachycardia. CT angiogram of the chest confirmed bilateral pulmonary embolism with mild right heart strain. Repeat limited echo showed RV to be mildly enlarged but systolic function normal. LVEF was 30-35%, mildly improved from previous study. BLE Dopplers negative for DVT. Given BMI of 35, not felt to be ideal candidate for DOAC. He was placed on coumadin w/ heparin bridge.   Echo in 1/22 showed that EF was about 45% with normal RV. Echo in 8/22 with EF up to 55-60%, normal RV.  Echo was done today and reviewed, EF 50% with global hypokinesis, mildly dilated RV with normal systolic function.   Weight is down 14 lbs, he is on tirzepatide.  He has been doing well symptomatically, no exertional dyspnea.  No orthopnea/PND.  No lightheadedness or palpitations.  No chest pain.  He had a hard time tolerating CPAP but is willing to try again if he can get a different mask.  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 He is drinking ETOH only rarely. He takes  Lasix prn occasionally.   Labs (10/21): digoxin 0.8, K 4, creatinine 1.45 Labs (1/22): K 4, creatinine 1.05 Labs (12/22): K 3.6, creatinine 1.18 Labs (6/23): BNP 10, K 3.5, creatinine 1.4  ECG (personally reviewed): NSR, 1st degree AVB, poor RWP  PMH: 1. OSA 2. Obesity 3. Prior ETOH abuse 4. Type 2 diabetes 5. Pulmonary embolus: 7/21, bilateral.  6. Chronic systolic CHF: Nonischemic cardiomyopathy.  - Echo (11/28/19): EF 20-25%, mild LVE, poorly visualized RV.  - RHC/LHC (7/21): No significant CAD, CI 2.0 - Echo (12/13/19): EF 30-35%, normal RV.  - Echo (1/22): EF 45%, normal RV - Echo (8/22): EF 55-60%, normal RV.  - Echo (10/23): EF 50%, mildly dilated RV with normal systolic function.  7. HTN  Review of Systems: All systems reviewed and negative except as per HPI.   Current Outpatient Medications  Medication Sig Dispense Refill   carvedilol (COREG) 25 MG tablet TAKE ONE TABLET BY MOUTH TWICE A DAY WITH A MEAL 60 tablet 6   Dapagliflozin-metFORMIN HCl ER 09-998 MG TB24 Take 1 tablet by mouth 2 (two) times daily.      ELIQUIS 5 MG TABS tablet TAKE ONE TABLET BY MOUTH TWICE A DAY *STOP COUMADIN* 60 tablet 6   ENTRESTO 97-103 MG TAKE ONE TABLET BY MOUTH TWICE A DAY 60 tablet 11   furosemide (LASIX) 80 MG tablet TAKE ONE TABLET BY MOUTH DAILY AS NEEDED 30 tablet 3   MAGNESIUM-OXIDE 400 (240 Mg)  MG tablet TAKE ONE TABLET BY MOUTH DAILY 30 tablet 2   ONETOUCH VERIO test strip 1 each 2 (two) times daily. Use as directed to check blood sugar twice daily.     pantoprazole (PROTONIX) 40 MG tablet Take 1 tablet (40 mg total) by mouth daily. 30 tablet 0   rosuvastatin (CRESTOR) 5 MG tablet Take 5 mg by mouth daily.     spironolactone (ALDACTONE) 25 MG tablet TAKE ONE TABLET BY MOUTH DAILY 30 tablet 6   tirzepatide (MOUNJARO) 12.5 MG/0.5ML Pen Inject 1 pen (12.5 MG) into the skin once a week (Patient taking differently: Inject 15 mg into the skin once a week.) 2 mL 0   tirzepatide (MOUNJARO)  7.5 MG/0.5ML Pen Inject 7.5 mg into the skin once a week.     No current facility-administered medications for this encounter.    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  Tobacco Use   Smoking status: Never   Smokeless tobacco: Former    Types: Snuff    Quit date: 2015  Vaping Use   Vaping Use: Never used  Substance and Sexual Activity   Alcohol use: Yes    Alcohol/week: 30.0 standard drinks of alcohol    Types: 30 Shots of liquor per week    Comment: weekly   Drug use: Never   Sexual activity: Not Currently    Birth control/protection: None  Other Topics Concern   Not on file  Social History Narrative   Not on file   Social Determinants of Health   Financial Resource Strain: Not on file  Food Insecurity: Not on file  Transportation Needs: Not on file  Physical Activity: Not on file  Stress: Not on file  Social Connections: Not on file  Intimate Partner Violence: Not on file      Family History  Problem Relation Age of Onset   Diabetes Mellitus II Mother    Heart failure Maternal Grandmother    Heart disease Maternal Grandfather     Vitals:   03/16/22 1011  BP: 130/82  Pulse: 65  SpO2: 96%  Weight: (!) 177.4 kg (391 lb)     PHYSICAL EXAM: General: NAD Neck: Thick. No JVD, no thyromegaly or thyroid nodule.  Lungs: Clear to auscultation bilaterally with normal respiratory effort. CV: Nondisplaced PMI.  Heart regular S1/S2, no S3/S4, no murmur.  No peripheral edema.  No carotid bruit.  Normal pedal pulses.  Abdomen: Soft, nontender, no hepatosplenomegaly, no distention.  Skin: Intact without lesions or rashes.  Neurologic: Alert and oriented x 3.  Psych: Normal affect. Extremities: No  clubbing or cyanosis.  HEENT: Normal.   ASSESSMENT & PLAN:   1. Chronic Systolic CHF: Nonischemic cardiomyopathy.  Echo 11/28/19 was difficult but EF appeared to be 20-25%. Cause is uncertain. However, heavy ETOH may play a role as well as HTN and untreated diabetes.  Cannot rule out viral myocarditis. LHC showed no coronary disease. RHC w/low CI at 2.0. Repeat limited Echo 12/12/19 showed slight improvement in LVEF 30-35%, RV systolic function normal.  Echo in 1/22 showed EF 45% with normal RV.  Echo in 8/22 with EF back up to 55-60%.  Echo today showed EF  50% with mildly dilated RV, normal RV systolic function.  On exam today, he is not volume overloaded.  NYHA class II symptoms.  Weight coming down.   - Continue to limit ETOH.  - He can continue to use Lasix prn.  - Continue spironolactone 25 mg daily. BMET today.  - Continue Coreg 25 mg bid.  - Continue Entresto 97/103 bid. - Continue dapagliflozin.  2. Bilateral PE: diagnosed 12/13/19. Bilateral LE venous dopplers negative for PE. Limited echo w/ mildly enlarged RV but normal systolic function. Suspect triggered by sedentary lifestyle + CHF.   - Continue Eliquis 5 mg bid.  3. Type 2 diabetes: Diagnosed in 7/21 with hgbA1c 11.   - on Dapagliflozin - Metformin - Continue tirzepatide.  3. OSA: Had a hard time with CPAP.  Willing to try again with another mask.  Says PCP has referred him to sleep medicine.  4. ETOH abuse: He is now limiting ETOH.   5. Obesity: Weight coming down.   - He will continue tirzepatide.   Followup in 6 months, BMET in 3 months.   Loralie Champagne, MD 03/16/22

## 2022-03-16 NOTE — Patient Instructions (Signed)
There has been no changes to your medications.  Labs done today, your results will be available in MyChart, we will contact you for abnormal readings.  Repeat blood work in 3 months ** please call office in December to arrange this appointment **   Your physician recommends that you schedule a follow-up appointment in: 6 months ( April) ** please call the office in February to arrange your follow up appointment **  If you have any questions or concerns before your next appointment please send [place] a message through [place] or call our office at [phone].    TO LEAVE A MESSAGE FOR THE NURSE SELECT OPTION 2, PLEASE LEAVE A MESSAGE INCLUDING: YOUR NAME DATE OF BIRTH CALL BACK NUMBER REASON FOR CALL**this is important as we prioritize the call backs  YOU WILL RECEIVE A CALL BACK THE SAME DAY AS LONG AS YOU CALL BEFORE 4:00 PM  At the [facility], you and your health needs are our priority. As part of our continuing mission to provide you with exceptional heart care, we have created designated Provider Care Teams. These Care Teams include your primary Cardiologist (physician) and Advanced Practice Providers (APPs- Physician Assistants and Nurse Practitioners) who all work together to provide you with the care you need, when you need it.   You may see any of the following providers on your designated Care Team at your next follow up: Dr Glori Bickers Dr Loralie Champagne Dr. Roxana Hires, NP Lyda Jester, [place] [facility] [place], [place] Forestine Na, NP Audry Riles, PharmD   Please be sure to bring in all your medications bottles to every appointment.

## 2022-03-17 NOTE — Progress Notes (Signed)
Labs drawn 10/25 were hemolyzed unable to be processed. Order cancelled, attempted to reach pt to sch lab appt and Left message to call back

## 2022-03-17 NOTE — Addendum Note (Signed)
Encounter addended by: Scarlette Calico, RN on: 03/17/2022 2:43 PM  Actions taken: Order list changed, Diagnosis association updated, Clinical Note Signed

## 2022-04-04 ENCOUNTER — Other Ambulatory Visit (HOSPITAL_COMMUNITY): Payer: Self-pay

## 2022-04-05 ENCOUNTER — Other Ambulatory Visit (HOSPITAL_COMMUNITY): Payer: Self-pay

## 2022-04-05 MED ORDER — MOUNJARO 12.5 MG/0.5ML ~~LOC~~ SOAJ
12.5000 mg | SUBCUTANEOUS | 2 refills | Status: DC
  Filled 2022-04-05: qty 2, 28d supply, fill #0

## 2022-05-25 ENCOUNTER — Other Ambulatory Visit (HOSPITAL_COMMUNITY): Payer: Self-pay | Admitting: Cardiology

## 2022-07-19 ENCOUNTER — Other Ambulatory Visit (HOSPITAL_COMMUNITY): Payer: Self-pay

## 2022-07-19 MED ORDER — MAGNESIUM OXIDE -MG SUPPLEMENT 400 (240 MG) MG PO TABS: 1.0000 | ORAL_TABLET | Freq: Every day | ORAL | 6 refills | Status: DC

## 2022-09-06 ENCOUNTER — Encounter (HOSPITAL_COMMUNITY): Payer: Self-pay

## 2022-09-06 ENCOUNTER — Encounter (HOSPITAL_COMMUNITY): Payer: 59 | Admitting: Cardiology

## 2022-09-16 ENCOUNTER — Other Ambulatory Visit (HOSPITAL_COMMUNITY): Payer: Self-pay | Admitting: Cardiology

## 2022-10-13 ENCOUNTER — Ambulatory Visit (HOSPITAL_COMMUNITY)
Admission: RE | Admit: 2022-10-13 | Discharge: 2022-10-13 | Disposition: A | Discharge status: Home / Self Care | Payer: 59 | Source: Ambulatory Visit | Attending: Cardiology | Admitting: Cardiology

## 2022-10-13 ENCOUNTER — Encounter (HOSPITAL_COMMUNITY): Payer: Self-pay | Admitting: Cardiology

## 2022-10-13 VITALS — BP 90/50 | HR 90 | Wt 393.0 lb

## 2022-10-13 DIAGNOSIS — E119 Type 2 diabetes mellitus without complications: Secondary | ICD-10-CM | POA: Diagnosis not present

## 2022-10-13 DIAGNOSIS — G4733 Obstructive sleep apnea (adult) (pediatric): Secondary | ICD-10-CM | POA: Insufficient documentation

## 2022-10-13 DIAGNOSIS — I110 Hypertensive heart disease with heart failure: Secondary | ICD-10-CM | POA: Diagnosis not present

## 2022-10-13 DIAGNOSIS — Z8249 Family history of ischemic heart disease and other diseases of the circulatory system: Secondary | ICD-10-CM | POA: Insufficient documentation

## 2022-10-13 DIAGNOSIS — Z6835 Body mass index (BMI) 35.0-35.9, adult: Secondary | ICD-10-CM | POA: Diagnosis not present

## 2022-10-13 DIAGNOSIS — Z7901 Long term (current) use of anticoagulants: Secondary | ICD-10-CM | POA: Insufficient documentation

## 2022-10-13 DIAGNOSIS — Z79899 Other long term (current) drug therapy: Secondary | ICD-10-CM | POA: Insufficient documentation

## 2022-10-13 DIAGNOSIS — I428 Other cardiomyopathies: Secondary | ICD-10-CM | POA: Diagnosis not present

## 2022-10-13 DIAGNOSIS — Z7984 Long term (current) use of oral hypoglycemic drugs: Secondary | ICD-10-CM | POA: Insufficient documentation

## 2022-10-13 DIAGNOSIS — E669 Obesity, unspecified: Secondary | ICD-10-CM | POA: Diagnosis not present

## 2022-10-13 DIAGNOSIS — I5022 Chronic systolic (congestive) heart failure: Secondary | ICD-10-CM | POA: Diagnosis not present

## 2022-10-13 DIAGNOSIS — Z833 Family history of diabetes mellitus: Secondary | ICD-10-CM | POA: Insufficient documentation

## 2022-10-13 DIAGNOSIS — Z86711 Personal history of pulmonary embolism: Secondary | ICD-10-CM | POA: Insufficient documentation

## 2022-10-13 LAB — BASIC METABOLIC PANEL
Anion gap: 15 (ref 5–15)
BUN: 19 mg/dL (ref 6–20)
CO2: 27 mmol/L (ref 22–32)
Calcium: 8.8 mg/dL — ABNORMAL LOW (ref 8.9–10.3)
Chloride: 92 mmol/L — ABNORMAL LOW (ref 98–111)
Creatinine, Ser: 1.37 mg/dL — ABNORMAL HIGH (ref 0.61–1.24)
GFR, Estimated: >60 mL/min (ref >60)
Glucose, Bld: 168 mg/dL — ABNORMAL HIGH (ref 70–99)
Potassium: 3.8 mmol/L (ref 3.5–5.1)
Sodium: 134 mmol/L — ABNORMAL LOW (ref 135–145)

## 2022-10-13 LAB — LIPID PANEL
Cholesterol: 194 mg/dL (ref 0–200)
HDL: 60 mg/dL (ref >40)
LDL Cholesterol: 110 mg/dL — ABNORMAL HIGH (ref 0–99)
Total CHOL/HDL Ratio: 3.2 RATIO
Triglycerides: 122 mg/dL (ref <150)
VLDL: 24 mg/dL (ref 0–40)

## 2022-10-13 LAB — CBC
HCT: 47.1 % (ref 39.0–52.0)
Hemoglobin: 16.1 g/dL (ref 13.0–17.0)
MCH: 30.4 pg (ref 26.0–34.0)
MCHC: 34.2 g/dL (ref 30.0–36.0)
MCV: 89 fL (ref 80.0–100.0)
Platelets: 289 10*3/uL (ref 150–400)
RBC: 5.29 MIL/uL (ref 4.22–5.81)
RDW: 13.5 % (ref 11.5–15.5)
WBC: 9.7 10*3/uL (ref 4.0–10.5)
nRBC: 0 % (ref 0.0–0.2)

## 2022-10-13 NOTE — Patient Instructions (Addendum)
There has been no changes to your medications.  Labs done today, your results will be available in MyChart, we will contact you for abnormal readings.  Your physician has requested that you have an echocardiogram. Echocardiography is a painless test that uses sound waves to create images of your heart. It provides your doctor with information about the size and shape of your heart and how well your heart's chambers and valves are working. This procedure takes approximately one hour. There are no restrictions for this procedure. Please do NOT wear cologne, perfume, aftershave, or lotions (deodorant is allowed). Please arrive 15 minutes prior to your appointment time.  Your physician recommends that you schedule a follow-up appointment in: 6 months ( November) with an echocardiogram. ** Please call the office in August to arrange your follow up appointment. **  If you have any questions or concerns before your next appointment please send [place] a message through [place] or call our office at [phone].    TO LEAVE A MESSAGE FOR THE NURSE SELECT OPTION 2, PLEASE LEAVE A MESSAGE INCLUDING: YOUR NAME DATE OF BIRTH CALL BACK NUMBER REASON FOR CALL**this is important as we prioritize the call backs  YOU WILL RECEIVE A CALL BACK THE SAME DAY AS LONG AS YOU CALL BEFORE 4:00 PM  At the [facility], you and your health needs are our priority. As part of our continuing mission to provide you with exceptional heart care, we have created designated Provider Care Teams. These Care Teams include your primary Cardiologist (physician) and Advanced Practice Providers (APPs- Physician Assistants and Nurse Practitioners) who all work together to provide you with the care you need, when you need it.   You may see any of the following providers on your designated Care Team at your next follow up: Dr Arvilla Meres Dr Marca Ancona Dr. Marcos Eke, NP Robbie Lis,  [place] [facility] [place], [place] Brynda Peon, NP Karle Plumber, PharmD   Please be sure to bring in all your medications bottles to every appointment.    Thank you for choosing [facility]

## 2022-10-13 NOTE — Progress Notes (Signed)
Advanced Heart Failure Clinic Note   Referring Physician: PCP: Sean Brunette, MD HF Cardiology: Dr. Shirlee Lawson  HPI:  [name] is a 37 y.o. with a history of HTN, obesity, ETOH abuse, systolic heart failure, PE and diabetes who presents for followup of CHF.     Recently admitted early 7/21 with increased dyspnea and leg edema. ECHO showed reduced EF 20-25%. Diuresed with IV lasix and transitioned to po lasix 80 mg daily. Overall diuresed 35 pounds. Had LHC/RHC that showed normal coronaries and elevated filling pressures, CI 2.0. Also new diagnosis of T2DM, Hgb A1c 11.4. Now on metformin + dapagliflozin. He was discharged home 12/03/19 on GDMT w/ plans to repeat 2D echo in 3 months.   Shortly after discharge, he was readmitted on 12/12/19 by IM for acute bilateral PE, he had been sedentary at work and at home. Was referred to ED by PCP for dyspnea and hypoxia. D-dimer elevated at 6.61. EKG showed sinus tachycardia. CT angiogram of the chest confirmed bilateral pulmonary embolism with mild right heart strain. Repeat limited echo showed RV to be mildly enlarged but systolic function normal. LVEF was 30-35%, mildly improved from previous study. BLE Dopplers negative for DVT. Given BMI of 35, not felt to be ideal candidate for DOAC. He was placed on coumadin w/ heparin bridge.   Echo in 1/22 showed that EF was about 45% with normal RV. Echo in 8/22 with EF up to 55-60%, normal RV.  Echo in 10/23 showed EF 50% with global hypokinesis, mildly dilated RV with normal systolic function.   He has been doing well symptomatically.  Works out daily.  Weight is unchanged, still taking Mounjaro.  He uses treadmill for 30-45 minutes on most days. No orthopnea/PND.  No significant exertional dyspnea.  No chest pain.  No lightheadedness or palpitations. Does not feel like he can tolerate CPAP.   Labs (10/21): digoxin 0.8, K 4, creatinine 1.45 Labs (1/22): K 4, creatinine 1.05 Labs (12/22): K 3.6, creatinine 1.18 Labs  (6/23): BNP 10, K 3.5, creatinine 1.4  ECG (personally reviewed): NSR, RVH  PMH: 1. OSA 2. Obesity 3. Prior ETOH abuse 4. Type 2 diabetes 5. Pulmonary embolus: 7/21, bilateral.  6. Chronic systolic CHF: Nonischemic cardiomyopathy.  - Echo (11/28/19): EF 20-25%, mild LVE, poorly visualized RV.  - RHC/LHC (7/21): No significant CAD, CI 2.0 - Echo (12/13/19): EF 30-35%, normal RV.  - Echo (1/22): EF 45%, normal RV - Echo (8/22): EF 55-60%, normal RV.  - Echo (10/23): EF 50%, mildly dilated RV with normal systolic function.  7. HTN  Review of Systems: All systems reviewed and negative except as per HPI.   Current Outpatient Medications  Medication Sig Dispense Refill   carvedilol (COREG) 25 MG tablet TAKE ONE TABLET BY MOUTH TWICE A DAY WITH A MEAL 60 tablet 6   Dapagliflozin-metFORMIN HCl ER 09-998 MG TB24 Take 1 tablet by mouth 2 (two) times daily.      ELIQUIS 5 MG TABS tablet TAKE ONE TABLET BY MOUTH TWICE A DAY **STOP COUMADIN** 60 tablet 6   ENTRESTO 97-103 MG TAKE ONE TABLET BY MOUTH TWICE A DAY 60 tablet 11   furosemide (LASIX) 80 MG tablet TAKE ONE TABLET BY MOUTH DAILY AS NEEDED 30 tablet 3   MAGnesium-Oxide 400 (240 Mg) MG tablet Take 1 tablet (400 mg total) by mouth daily. 30 tablet 6   ONETOUCH VERIO test strip 1 each 2 (two) times daily. Use as directed to check blood sugar twice daily.  pantoprazole (PROTONIX) 40 MG tablet Take 1 tablet (40 mg total) by mouth daily. 30 tablet 0   rosuvastatin (CRESTOR) 5 MG tablet Take 5 mg by mouth daily.     spironolactone (ALDACTONE) 25 MG tablet TAKE ONE TABLET BY MOUTH DAILY 30 tablet 6   tirzepatide (MOUNJARO) 15 MG/0.5ML Pen Inject 15 mg into the skin once a week.     No current facility-administered medications for this encounter.    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  Tobacco Use   Smoking status: Never   Smokeless tobacco: Former    Types: Snuff    Quit date: 2015  Vaping Use   Vaping Use: Never used  Substance and Sexual Activity   Alcohol use: Yes    Alcohol/week: 30.0 standard drinks of alcohol    Types: 30 Shots of liquor per week    Comment: weekly   Drug use: Never   Sexual activity: Not Currently    Birth control/protection: None  Other Topics Concern   Not on file  Social History Narrative   Not on file   Social Determinants of Health   Financial Resource Strain: Not on file  Food Insecurity: Not on file  Transportation Needs: Not on file  Physical Activity: Not on file  Stress: Not on file  Social Connections: Not on file  Intimate Partner Violence: Not on file      Family History  Problem Relation Age of Onset   Diabetes Mellitus II Mother    Heart failure Maternal Grandmother    Heart disease Maternal Grandfather     Vitals:   10/13/22 1106  BP: (!) 90/50  Pulse: 90  SpO2: 96%  Weight: (!) 178.3 kg (393 lb)   PHYSICAL EXAM: General: NAD, obese.  Neck: No JVD, no thyromegaly or thyroid nodule.  Lungs: Clear to auscultation bilaterally with normal respiratory effort. CV: Nondisplaced PMI.  Heart regular S1/S2, no S3/S4, no murmur.  No peripheral edema.  No carotid bruit.  Normal pedal pulses.  Abdomen: Soft, nontender, no hepatosplenomegaly, no distention.  Skin: Intact without lesions or rashes.  Neurologic: Alert and oriented x 3.  Psych: Normal affect. Extremities: No clubbing or cyanosis.  HEENT: Normal.   ASSESSMENT & PLAN:   1. Chronic Systolic CHF: Nonischemic cardiomyopathy.  Echo 11/28/19 was difficult but EF appeared to be 20-25%. Cause is uncertain. However, heavy ETOH may play a role as well as HTN and untreated diabetes.  Cannot rule out viral myocarditis. LHC showed no coronary  disease. RHC w/low CI at 2.0. Repeat limited Echo 12/12/19 showed slight improvement in LVEF 30-35%, RV systolic function normal.  Echo in 1/22 showed EF 45% with normal RV.  Echo in 8/22 with EF back up to 55-60%.  Echo in 10/23 showed EF 50% with mildly dilated RV, normal RV systolic function.  On exam today, he is not volume overloaded.  NYHA class I-II symptoms.   - Continue to limit ETOH.  - He can continue to use Lasix prn.  - Continue spironolactone 25 mg daily. BMET today.  - Continue Coreg 25 mg bid.  - Continue Entresto 97/103 bid. - Continue dapagliflozin.  - Repeat echo  at followup in 6 months.  2. Bilateral PE: diagnosed 12/13/19. Bilateral LE venous dopplers negative for PE. Limited echo w/ mildly enlarged RV but normal systolic function. Suspect triggered by sedentary lifestyle + CHF.   - Continue Eliquis 5 mg bid. CBC today.  3. Type 2 diabetes: Diagnosed in 7/21 with hgbA1c 11.   - on Dapagliflozin - Metformin - Continue tirzepatide.  3. OSA: Had a hard time with CPAP.  Does not want to retry.  Work on losing weight.  4. ETOH abuse: He is now limiting ETOH.   5. Obesity: Weight stable.    - He will continue tirzepatide.  - Continue to exercise and cut back calories.   Followup in 6 months with echo, BMET in 3 months.   Sean Ancona, MD 10/13/22

## 2022-12-06 ENCOUNTER — Other Ambulatory Visit: Payer: Self-pay | Admitting: *Deleted

## 2022-12-06 MED ORDER — FUROSEMIDE 80 MG PO TABS: 80.0000 mg | ORAL_TABLET | Freq: Every day | ORAL | 3 refills | Status: DC | PRN

## 2023-01-24 ENCOUNTER — Telehealth (HOSPITAL_COMMUNITY): Payer: Self-pay | Admitting: Cardiology

## 2023-01-24 NOTE — Telephone Encounter (Signed)
Pts mother called to request to have pre visit labs drawn at PCP. Reports additional fees are included when labs are done with the HF clinic   Advised to have labs done 1-2 weeks prior to upcoming appt.  Shirlee Latch which labs are needed?

## 2023-01-24 NOTE — Telephone Encounter (Signed)
BMET/BNP

## 2023-01-26 NOTE — Telephone Encounter (Signed)
LMOM

## 2023-03-10 ENCOUNTER — Other Ambulatory Visit (HOSPITAL_COMMUNITY): Payer: Self-pay | Admitting: Cardiology

## 2023-03-10 ENCOUNTER — Other Ambulatory Visit (HOSPITAL_COMMUNITY): Payer: Self-pay | Admitting: Adult Health

## 2023-03-30 LAB — LAB REPORT - SCANNED
A1c: 5.8
EGFR: 91

## 2023-04-10 ENCOUNTER — Ambulatory Visit (HOSPITAL_COMMUNITY)
Admission: RE | Admit: 2023-04-10 | Discharge: 2023-04-10 | Disposition: A | Discharge status: Home / Self Care | Payer: 59 | Source: Ambulatory Visit | Attending: Cardiology | Admitting: Cardiology

## 2023-04-10 ENCOUNTER — Encounter (HOSPITAL_COMMUNITY): Payer: Self-pay | Admitting: Cardiology

## 2023-04-10 ENCOUNTER — Other Ambulatory Visit (HOSPITAL_COMMUNITY): Payer: 59

## 2023-04-10 VITALS — BP 104/78 | HR 92 | Wt 394.8 lb

## 2023-04-10 DIAGNOSIS — I5022 Chronic systolic (congestive) heart failure: Secondary | ICD-10-CM | POA: Diagnosis not present

## 2023-04-10 DIAGNOSIS — Z6835 Body mass index (BMI) 35.0-35.9, adult: Secondary | ICD-10-CM | POA: Diagnosis not present

## 2023-04-10 DIAGNOSIS — Z7985 Long-term (current) use of injectable non-insulin antidiabetic drugs: Secondary | ICD-10-CM | POA: Diagnosis not present

## 2023-04-10 DIAGNOSIS — Z86711 Personal history of pulmonary embolism: Secondary | ICD-10-CM | POA: Insufficient documentation

## 2023-04-10 DIAGNOSIS — E119 Type 2 diabetes mellitus without complications: Secondary | ICD-10-CM | POA: Diagnosis not present

## 2023-04-10 DIAGNOSIS — Z7901 Long term (current) use of anticoagulants: Secondary | ICD-10-CM | POA: Insufficient documentation

## 2023-04-10 DIAGNOSIS — G4733 Obstructive sleep apnea (adult) (pediatric): Secondary | ICD-10-CM | POA: Insufficient documentation

## 2023-04-10 DIAGNOSIS — R9431 Abnormal electrocardiogram [ECG] [EKG]: Secondary | ICD-10-CM | POA: Insufficient documentation

## 2023-04-10 DIAGNOSIS — I110 Hypertensive heart disease with heart failure: Secondary | ICD-10-CM | POA: Diagnosis present

## 2023-04-10 DIAGNOSIS — Z7984 Long term (current) use of oral hypoglycemic drugs: Secondary | ICD-10-CM | POA: Insufficient documentation

## 2023-04-10 DIAGNOSIS — F1010 Alcohol abuse, uncomplicated: Secondary | ICD-10-CM | POA: Insufficient documentation

## 2023-04-10 DIAGNOSIS — I428 Other cardiomyopathies: Secondary | ICD-10-CM | POA: Diagnosis not present

## 2023-04-10 DIAGNOSIS — I77810 Thoracic aortic ectasia: Secondary | ICD-10-CM | POA: Diagnosis not present

## 2023-04-10 DIAGNOSIS — E669 Obesity, unspecified: Secondary | ICD-10-CM | POA: Insufficient documentation

## 2023-04-10 LAB — ECHOCARDIOGRAM COMPLETE
AR max vel: 4.47 cm2
AV Area VTI: 4.47 cm2
AV Area mean vel: 4.29 cm2
AV Mean grad: 1 mm[Hg]
AV Peak grad: 2.6 mm[Hg]
Ao pk vel: 0.81 m/s
Area-P 1/2: 7.09 cm2
S' Lateral: 4 cm

## 2023-04-10 MED ORDER — ROSUVASTATIN CALCIUM 10 MG PO TABS: 5.0000 mg | ORAL_TABLET | Freq: Every day | ORAL | 3 refills | Status: AC

## 2023-04-10 NOTE — Progress Notes (Signed)
[facility] Note   Referring Physician: PCP: Sean Brunette, MD HF Cardiology: Dr. Shirlee Lawson  HPI:  [name] is a 37 y.o. with a history of HTN, obesity, ETOH abuse, systolic heart failure, PE and diabetes who presents for followup of CHF.     Recently admitted early 7/21 with increased dyspnea and leg edema. ECHO showed reduced EF 20-25%. Diuresed with IV lasix and transitioned to po lasix 80 mg daily. Overall diuresed 35 pounds. Had LHC/RHC that showed normal coronaries and elevated filling pressures, CI 2.0. Also new diagnosis of T2DM, Hgb A1c 11.4. Now on metformin + dapagliflozin. He was discharged home 12/03/19 on GDMT w/ plans to repeat 2D echo in 3 months.   Shortly after discharge, he was readmitted on 12/12/19 by IM for acute bilateral PE, he had been sedentary at work and at home. Was referred to ED by PCP for dyspnea and hypoxia. D-dimer elevated at 6.61. EKG showed sinus tachycardia. CT angiogram of the chest confirmed bilateral pulmonary embolism with mild right heart strain. Repeat limited echo showed RV to be mildly enlarged but systolic function normal. LVEF was 30-35%, mildly improved from previous study. BLE Dopplers negative for DVT. Given BMI of 35, not felt to be ideal candidate for DOAC. He was placed on coumadin w/ heparin bridge.   Echo in 1/22 showed that EF was about 45% with normal RV. Echo in 8/22 with EF up to 55-60%, normal RV.  Echo in 10/23 showed EF 50% with global hypokinesis, mildly dilated RV with normal systolic function.   Echo was done today and reviewed, EF appears to be in the 45% range (technically difficult study), mild LVH, normal RV size and systolic function, 4.0 cm ascending aorta.   No exertional dyspnea or chest pain.  Walks 30-45 minutes on treadmill at least a couple days/week. 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  No lightheadedness or palpitations.  He is on Mounjaro but still  not losing weight. Diet is generally poor. Rare ETOH.   Labs (10/21): digoxin 0.8, K 4, creatinine 1.45 Labs (1/22): K 4, creatinine 1.05 Labs (12/22): K 3.6, creatinine 1.18 Labs (6/23): BNP 10, K 3.5, creatinine 1.4 Labs (5/24): LDL 110 Labs (11/24): K 4.4, creatinine 1.07, hgb 16.4  ECG (personally reviewed): NSR, right axis deviation.   PMH: 1. OSA 2. Obesity 3. Prior ETOH abuse 4. Type 2 diabetes 5. Pulmonary embolus: 7/21, bilateral.  6. Chronic systolic CHF: Nonischemic cardiomyopathy.  - Echo (11/28/19): EF 20-25%, mild LVE, poorly visualized RV.  - RHC/LHC (7/21): No significant CAD, CI 2.0 - Echo (12/13/19): EF 30-35%, normal RV.  - Echo (1/22): EF 45%, normal RV - Echo (8/22): EF 55-60%, normal RV.  - Echo (10/23): EF 50%, mildly dilated RV with normal systolic function.  - Echo (11/24): 45% range, mild LVH, normal RV size and systolic function, 4.0 cm ascending aorta.  7. HTN  Review of Systems: All systems reviewed and negative except as per HPI.   Current Outpatient Medications  Medication Sig Dispense Refill   carvedilol (COREG) 25 MG tablet TAKE ONE TABLET BY MOUTH TWICE A DAY WITH MEALS 180 tablet 1   Dapagliflozin-metFORMIN HCl ER 09-998 MG TB24 Take 1 tablet by mouth 2 (two) times daily.      ELIQUIS 5 MG TABS tablet TAKE ONE TABLET BY MOUTH TWICE A DAY **STOP COUMADIN** 60 tablet 6   ENTRESTO 97-103 MG TAKE ONE TABLET BY MOUTH TWICE A  DAY 60 tablet 11   furosemide (LASIX) 80 MG tablet Take 1 tablet (80 mg total) by mouth daily as needed. 30 tablet 3   MAGnesium-Oxide 400 (240 Mg) MG tablet Take 1 tablet (400 mg total) by mouth daily. 30 tablet 6   ONETOUCH VERIO test strip 1 each 2 (two) times daily. Use as directed to check blood sugar twice daily.     pantoprazole (PROTONIX) 40 MG tablet Take 1 tablet (40 mg total) by mouth daily. 30 tablet 0   spironolactone (ALDACTONE) 25 MG tablet TAKE 1 TABLET BY MOUTH DAILY 30 tablet 6   tirzepatide (MOUNJARO) 15  MG/0.5ML Pen Inject 15 mg into the skin once a week.     rosuvastatin (CRESTOR) 10 MG tablet Take 0.5 tablets (5 mg total) by mouth daily. 90 tablet 3   No current facility-administered medications for this encounter.    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  Tobacco Use   Smoking status: Never   Smokeless tobacco: Former    Types: Snuff    Quit date: 2015  Vaping Use   Vaping status: Never Used  Substance and Sexual Activity   Alcohol use: Yes    Alcohol/week: 30.0 standard drinks of alcohol    Types: 30 Shots of liquor per week    Comment: weekly   Drug use: Never   Sexual activity: Not Currently    Birth control/protection: None  Other Topics Concern   Not on file  Social History Narrative   Not on file   Social Determinants of Health   Financial Resource Strain: Not on file  Food Insecurity: Not on file  Transportation Needs: Not on file  Physical Activity: Not on file  Stress: Not on file  Social Connections: Not on file  Intimate Partner Violence: Not on file      Family History  Problem Relation Age of Onset   Diabetes Mellitus II Mother    Heart failure Maternal Grandmother    Heart disease Maternal Grandfather     Vitals:   04/10/23 1343  BP: 104/78  Pulse: 92  SpO2: 96%  Weight: (!) 179.1 kg (394 lb 12.8 oz)   PHYSICAL EXAM: General: NAD Neck: No JVD, no thyromegaly or thyroid nodule.  Lungs: Clear to auscultation bilaterally with normal respiratory effort. CV: Nondisplaced PMI.  Heart regular S1/S2, no S3/S4, no murmur.  No peripheral edema.  No carotid bruit.  Normal pedal pulses.  Abdomen: Soft, nontender, no hepatosplenomegaly, no distention.  Skin: Intact without lesions or  rashes.  Neurologic: Alert and oriented x 3.  Psych: Normal affect. Extremities: No clubbing or cyanosis.  HEENT: Normal.   ASSESSMENT & PLAN:   1. Chronic Systolic CHF: Nonischemic cardiomyopathy.  Echo 11/28/19 was difficult but EF appeared to be 20-25%. Cause is uncertain. However, heavy ETOH may play a role as well as HTN and untreated diabetes.  Cannot rule out viral myocarditis. LHC showed no coronary disease. RHC w/low CI at 2.0. Repeat limited Echo 12/12/19 showed slight improvement in LVEF 30-35%, RV systolic function normal.  Echo in 1/22 showed EF 45% with normal RV.  Echo in 8/22 with EF back up to 55-60%.  Echo in 10/23 showed EF 50% with mildly dilated RV, normal RV systolic function. Echo today showed EF 45% range, mild LVH, normal RV size and systolic function, 4.0 cm ascending aorta.  He is not volume overloaded on exam, NYHA class I-II synmptoms.  - Continue to limit ETOH.  - He does not need a diuretic.  - Continue spironolactone 25 mg daily. Stable BMET recently.   - Continue Coreg 25 mg bid.  - Continue Entresto 97/103 bid. - Continue dapagliflozin.  2. Bilateral PE: diagnosed 12/13/19. Bilateral LE venous dopplers negative for PE. Limited echo w/ mildly enlarged RV but normal systolic function. Suspect triggered by sedentary lifestyle + CHF.   - Continue Eliquis 5 mg bid.  3. Type 2 diabetes: Diagnosed in 7/21 with hgbA1c 11.   - on Dapagliflozin - Metformin - Continue tirzepatide.  3. OSA: Had a hard time with CPAP.  Does not want to retry.  Work on losing weight.  4. ETOH abuse: He is now limiting ETOH.   5. Obesity: Weight stable.    - He will continue tirzepatide.  - Continue to exercise and cut back calories.   Followup in 6 months, BMET 3 months.   Sean Ancona, MD 04/10/23

## 2023-04-10 NOTE — Patient Instructions (Signed)
INCREASE Crestor to 10 mg daily.  Blood work in  2 months.  Your physician recommends that you schedule a follow-up appointment in: 6 months (May 2025)** PLEASE CALL THE OFFICE MARCH 2025 TO ARRANGE YOUR FOLLOW UP APPOINTMENT. **  If you have any questions or concerns before your next appointment please send [place] a message through [place] or call our office at [phone].    TO LEAVE A MESSAGE FOR THE NURSE SELECT OPTION 2, PLEASE LEAVE A MESSAGE INCLUDING: YOUR NAME DATE OF BIRTH CALL BACK NUMBER REASON FOR CALL**this is important as we prioritize the call backs  YOU WILL RECEIVE A CALL BACK THE SAME DAY AS LONG AS YOU CALL BEFORE 4:00 PM  At the [facility], you and your health needs are our priority. As part of our continuing mission to provide you with exceptional heart care, we have created designated Provider Care Teams. These Care Teams include your primary Cardiologist (physician) and Advanced Practice Providers (APPs- Physician Assistants and Nurse Practitioners) who all work together to provide you with the care you need, when you need it.   You may see any of the following providers on your designated Care Team at your next follow up: Dr Arvilla Meres Dr Marca Ancona Dr. Dorthula Nettles Dr. Clearnce Hasten Amy Filbert Schilder, NP Robbie Lis, [place] [facility] [place], [place] Brynda Peon, NP [place] Lee, NP Karle Plumber, PharmD   Please be sure to bring in all your medications bottles to every appointment.    Thank you for choosing [facility]

## 2023-05-30 ENCOUNTER — Other Ambulatory Visit (HOSPITAL_COMMUNITY): Payer: Self-pay

## 2023-05-30 DIAGNOSIS — I5022 Chronic systolic (congestive) heart failure: Secondary | ICD-10-CM

## 2023-05-30 MED ORDER — ENTRESTO 97-103 MG PO TABS: 1.0000 | ORAL_TABLET | Freq: Two times a day (BID) | ORAL | 11 refills | Status: AC

## 2023-06-09 ENCOUNTER — Other Ambulatory Visit (HOSPITAL_COMMUNITY): Payer: 59

## 2023-09-28 ENCOUNTER — Other Ambulatory Visit (HOSPITAL_COMMUNITY): Payer: Self-pay | Admitting: Cardiology

## 2023-09-30 LAB — LAB REPORT - SCANNED
A1c: 6
Albumin, Urine POC: 25.4
Albumin/Creatinine Ratio, Urine, POC: 8
Creatinine, POC: 328.1 mg/dL
EGFR: 82

## 2023-10-03 ENCOUNTER — Ambulatory Visit (HOSPITAL_COMMUNITY): Payer: Self-pay | Admitting: Cardiology

## 2023-10-24 ENCOUNTER — Other Ambulatory Visit: Payer: Self-pay | Admitting: Adult Health

## 2024-01-01 ENCOUNTER — Other Ambulatory Visit (HOSPITAL_COMMUNITY): Payer: Self-pay | Admitting: Cardiology

## 2024-03-14 ENCOUNTER — Other Ambulatory Visit (HOSPITAL_COMMUNITY): Payer: Self-pay | Admitting: Cardiology

## 2024-03-14 ENCOUNTER — Other Ambulatory Visit (HOSPITAL_COMMUNITY): Payer: Self-pay | Admitting: Adult Health

## 2024-04-08 ENCOUNTER — Other Ambulatory Visit: Payer: Self-pay | Admitting: *Deleted

## 2024-04-08 DIAGNOSIS — I5022 Chronic systolic (congestive) heart failure: Secondary | ICD-10-CM

## 2024-04-08 NOTE — Progress Notes (Signed)
 Per Dr Rolan pt will need echo prior to f/u appt as he is due for yearly echo, order placed

## 2024-04-22 ENCOUNTER — Ambulatory Visit (HOSPITAL_COMMUNITY): Payer: Self-pay | Admitting: Cardiology

## 2024-04-22 ENCOUNTER — Ambulatory Visit (HOSPITAL_COMMUNITY)
Admission: RE | Admit: 2024-04-22 | Discharge: 2024-04-22 | Disposition: A | Source: Ambulatory Visit | Attending: Cardiology | Admitting: Cardiology

## 2024-04-22 ENCOUNTER — Encounter (HOSPITAL_COMMUNITY): Payer: Self-pay | Admitting: Cardiology

## 2024-04-22 VITALS — BP 130/90 | HR 89 | Wt >= 6400 oz

## 2024-04-22 DIAGNOSIS — Z86711 Personal history of pulmonary embolism: Secondary | ICD-10-CM | POA: Insufficient documentation

## 2024-04-22 DIAGNOSIS — I5022 Chronic systolic (congestive) heart failure: Secondary | ICD-10-CM | POA: Diagnosis present

## 2024-04-22 DIAGNOSIS — Z7984 Long term (current) use of oral hypoglycemic drugs: Secondary | ICD-10-CM | POA: Insufficient documentation

## 2024-04-22 DIAGNOSIS — G4733 Obstructive sleep apnea (adult) (pediatric): Secondary | ICD-10-CM | POA: Insufficient documentation

## 2024-04-22 DIAGNOSIS — Z7985 Long-term (current) use of injectable non-insulin antidiabetic drugs: Secondary | ICD-10-CM | POA: Insufficient documentation

## 2024-04-22 DIAGNOSIS — I428 Other cardiomyopathies: Secondary | ICD-10-CM | POA: Insufficient documentation

## 2024-04-22 DIAGNOSIS — I77810 Thoracic aortic ectasia: Secondary | ICD-10-CM | POA: Insufficient documentation

## 2024-04-22 DIAGNOSIS — Z7901 Long term (current) use of anticoagulants: Secondary | ICD-10-CM | POA: Diagnosis not present

## 2024-04-22 DIAGNOSIS — E669 Obesity, unspecified: Secondary | ICD-10-CM | POA: Diagnosis not present

## 2024-04-22 DIAGNOSIS — E119 Type 2 diabetes mellitus without complications: Secondary | ICD-10-CM | POA: Insufficient documentation

## 2024-04-22 DIAGNOSIS — I110 Hypertensive heart disease with heart failure: Secondary | ICD-10-CM | POA: Insufficient documentation

## 2024-04-22 DIAGNOSIS — Z79899 Other long term (current) drug therapy: Secondary | ICD-10-CM | POA: Insufficient documentation

## 2024-04-22 DIAGNOSIS — F1010 Alcohol abuse, uncomplicated: Secondary | ICD-10-CM | POA: Insufficient documentation

## 2024-04-22 LAB — ECHOCARDIOGRAM COMPLETE
Area-P 1/2: 5.66 cm2
Calc EF: 66 %
MV VTI: 6.27 cm2
S' Lateral: 4.2 cm
Single Plane A2C EF: 70.4 %
Single Plane A4C EF: 60.8 %

## 2024-04-22 MED ORDER — PERFLUTREN LIPID MICROSPHERE
1.0000 mL | INTRAVENOUS | Status: DC | PRN
  Administered 2024-04-22: 2 mL via INTRAVENOUS

## 2024-04-22 NOTE — Progress Notes (Signed)
  Echocardiogram 2D Echocardiogram has been performed.  [name] 04/22/2024, 1:44 PM

## 2024-04-22 NOTE — Patient Instructions (Signed)
 Great to see you today!!!  Your physician recommends that you schedule a follow-up appointment in: 6 months (June 2026), **PLEASE CALL OUR OFFICE IN APRIL TO SCHEDULE THIS APPOINTMENT  If you have any questions or concerns before your next appointment please send us  a message through mychart or call our office at [phone].    TO LEAVE A MESSAGE FOR THE NURSE SELECT OPTION 2, PLEASE LEAVE A MESSAGE INCLUDING: YOUR NAME DATE OF BIRTH CALL BACK NUMBER REASON FOR CALL**this is important as we prioritize the call backs  YOU WILL RECEIVE A CALL BACK THE SAME DAY AS LONG AS YOU CALL BEFORE 4:00 PM  At the [facility], you and your health needs are our priority. As part of our continuing mission to provide you with exceptional heart care, we have created designated Provider Care Teams. These Care Teams include your primary Cardiologist (physician) and Advanced Practice Providers (APPs- Physician Assistants and Nurse Practitioners) who all work together to provide you with the care you need, when you need it.   You may see any of the following providers on your designated Care Team at your next follow up: Dr Toribio Fuel Dr Ezra Shuck Dr. Morene Brownie Greig Mosses, NP Caffie Shed, [place] [facility] [place], [place] Beckey Coe, NP Jordan Lee, NP Ellouise Class, NP Tinnie Redman, PharmD Jaun Bash, PharmD   Please be sure to bring in all your medications bottles to every appointment.    Thank you for choosing [facility]

## 2024-04-22 NOTE — Progress Notes (Signed)
 Advanced Heart Failure Clinic Note   Referring Physician: PCP: Clarice Nottingham, MD HF Cardiology: Dr. Rolan  HPI:  Sean Lawson is a 38 y.o. with a history of HTN, obesity, ETOH abuse, systolic heart failure, PE and diabetes who presents for followup of CHF.     Recently admitted early 7/21 with increased dyspnea and leg edema. ECHO showed reduced EF 20-25%. Diuresed with IV lasix  and transitioned to po lasix  80 mg daily. Overall diuresed 35 pounds. Had LHC/RHC that showed normal coronaries and elevated filling pressures, CI 2.0. Also new diagnosis of T2DM, Hgb A1c 11.4. Now on metformin  + dapagliflozin . He was discharged home 12/03/19 on GDMT w/ plans to repeat 2D echo in 3 months.   Shortly after discharge, he was readmitted on 12/12/19 by IM for acute bilateral PE, he had been sedentary at work and at home. Was referred to ED by PCP for dyspnea and hypoxia. D-dimer elevated at 6.61. EKG showed sinus tachycardia. CT angiogram of the chest confirmed bilateral pulmonary embolism with mild right heart strain. Repeat limited echo showed RV to be mildly enlarged but systolic function normal. LVEF was 30-35%, mildly improved from previous study. BLE Dopplers negative for DVT. Given BMI of 35, not felt to be ideal candidate for DOAC. He was placed on coumadin  w/ heparin  bridge.   Echo in 1/22 showed that EF was about 45% with normal RV. Echo in 8/22 with EF up to 55-60%, normal RV.  Echo in 10/23 showed EF 50% with global hypokinesis, mildly dilated RV with normal systolic function.   Echo in 11/24 showed EF in the 45% range (technically difficult study), mild LVH, normal RV size and systolic function, 4.0 cm ascending aorta. Echo was done today and I reviewed, EF 55-60%, mild LV dilation, mild RV enlargement with normal systolic function.   He is doing well symptomatically.  No exertional dyspnea or chest pain.  No lightheadedness or palpitations.  He has not been exercising much, too busy with football  season. 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  Drinks moderate ETOH. Weight is up (worse diet and less exercise in football season). Uses mouthpiece for OSA.   Labs (5/24): LDL 110 Labs (11/24): K 4.4, creatinine 1.07, hgb 16.4 Labs (5/25): K 4.5, creatinine 1.16, LDL 95 Labs (11/25): LDL 90, TGs 192, hgb 15.8, K 4.1, creatinine 1.25  ECG (personally reviewed): NSR, right axis deviation, poor RWP.    PMH: 1. OSA 2. Obesity 3. Prior ETOH abuse 4. Type 2 diabetes 5. Pulmonary embolus: 7/21, bilateral.  6. Chronic systolic CHF: Nonischemic cardiomyopathy.  - Echo (11/28/19): EF 20-25%, mild LVE, poorly visualized RV.  - RHC/LHC (7/21): No significant CAD, CI 2.0 - Echo (12/13/19): EF 30-35%, normal RV.  - Echo (1/22): EF 45%, normal RV - Echo (8/22): EF 55-60%, normal RV.  - Echo (10/23): EF 50%, mildly dilated RV with normal systolic function.  - Echo (11/24): 45% range, mild LVH, normal RV size and systolic function, 4.0 cm ascending aorta.  - Echo (11/25): EF 55-60%, mild LV dilation, mild RV enlargement with normal systolic function.  7. HTN  Review of Systems: All systems reviewed and negative except as per HPI.   Current Outpatient Medications  Medication Sig Dispense Refill   carvedilol  (COREG ) 25 MG tablet Take 1 tablet (25 mg total) by mouth 2 (two) times daily with a meal. PLEASE SCHEDULE APPOINTMENT FOR MORE REFILLS CALL [phone] OPTION 2 60 tablet 0   Dapagliflozin -metFORMIN  HCl ER 09-998  MG TB24 Take 1 tablet by mouth 2 (two) times daily.      ELIQUIS  5 MG TABS tablet TAKE 1 TABLET BY MOUTH 2 TIMES A DAY *STOP COUMADIN * 60 tablet 6   furosemide  (LASIX ) 80 MG tablet Take 1 tablet (80 mg total) by mouth daily as needed. NEEDS FOLLOW UP APPOINTMENT FOR MORE REFILLS 30 tablet 0   MAGNESIUM -OXIDE 400 (240 Mg) MG tablet TAKE 1 TABLET BY MOUTH DAILY 30 tablet 6   ONETOUCH VERIO test strip 1 each 2 (two) times daily. Use as directed to check blood sugar  twice daily.     pantoprazole  (PROTONIX ) 40 MG tablet Take 1 tablet (40 mg total) by mouth daily. 30 tablet 0   rosuvastatin  (CRESTOR ) 10 MG tablet Take 0.5 tablets (5 mg total) by mouth daily. 90 tablet 3   sacubitril -valsartan  (ENTRESTO ) 97-103 MG Take 1 tablet by mouth 2 (two) times daily. 60 tablet 11   spironolactone  (ALDACTONE ) 25 MG tablet TAKE 1 TABLET BY MOUTH DAILY 30 tablet 6   tirzepatide  (MOUNJARO ) 15 MG/0.5ML Pen Inject 15 mg into the skin once a week.     No current facility-administered medications for this encounter.    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  Tobacco Use   Smoking status: Never   Smokeless tobacco: Former    Types: Snuff    Quit date: 2015  Vaping Use   Vaping status: Never Used  Substance and Sexual Activity   Alcohol use: Yes    Alcohol/week: 30.0 standard drinks of alcohol    Types: 30 Shots of liquor per week    Comment: weekly   Drug use: Never   Sexual activity: Not Currently    Birth control/protection: None  Other Topics Concern   Not on file  Social History Narrative   Not on file   Social Drivers of Health   Financial Resource Strain: Not on file  Food Insecurity: Not on file  Transportation Needs: Not on file  Physical Activity: Not on file  Stress: Not on file  Social Connections: Not on file  Intimate Partner Violence: Not on file      Family History  Problem Relation Age of Onset   Diabetes Mellitus II Mother    Heart failure Maternal Grandmother    Heart disease Maternal Grandfather     Vitals:   04/22/24 1413  BP: (!) 130/90  Pulse: 89  SpO2: 95%  Weight: (!) 182.3 kg (401 lb 12.8 oz)   PHYSICAL EXAM: General: NAD, obese.  Neck: No JVD, no  thyromegaly or thyroid  nodule.  Lungs: Clear to auscultation bilaterally with normal respiratory effort. CV: Nondisplaced PMI.  Heart regular S1/S2, no S3/S4, no murmur.  No peripheral edema.  No carotid bruit.  Normal pedal pulses.  Abdomen: Soft, nontender, no hepatosplenomegaly, no distention.  Skin: Intact without lesions or rashes.  Neurologic: Alert and oriented x 3.  Psych: Normal affect. Extremities: No clubbing or cyanosis.  HEENT: Normal.   ASSESSMENT & PLAN:   1. Chronic Systolic CHF: Nonischemic cardiomyopathy.  Echo 11/28/19 was difficult but EF appeared to be 20-25%. Cause is uncertain. However, heavy ETOH may play a role as well  as HTN and untreated diabetes.  Cannot rule out viral myocarditis. LHC showed no coronary disease. RHC w/low CI at 2.0. Repeat limited Echo 12/12/19 showed slight improvement in LVEF 30-35%, RV systolic function normal.  Echo in 1/22 showed EF 45% with normal RV.  Echo in 8/22 with EF back up to 55-60%.  Echo in 10/23 showed EF 50% with mildly dilated RV, normal RV systolic function. Echo in 11/24 showed EF 45% range, mild LVH, normal RV size and systolic function, 4.0 cm ascending aorta.  Echo today showed EF 55-60%, mild LV dilation, mild RV enlargement with normal systolic function.   He is not volume overloaded on exam, NYHA class I symptoms.   - Continue to limit ETOH.  - He does not need a diuretic.  - Continue spironolactone  25 mg daily. BMET good in 11/25, reviewed today.  - Continue Coreg  25 mg bid.  - Continue Entresto  97/103 bid. - Continue dapagliflozin .  2. Bilateral PE: diagnosed 12/13/19. Bilateral LE venous dopplers negative for PE. Limited echo w/ mildly enlarged RV but normal systolic function. Suspect triggered by sedentary lifestyle + CHF.   - Continue Eliquis  5 mg bid.  3. Type 2 diabetes: Diagnosed in 7/21 with hgbA1c 11.   - on Dapagliflozin  - Metformin  - Continue tirzepatide .  3. OSA: Had a hard time with CPAP.  Does not want to  retry.  Using a mouthpiece.   4. ETOH abuse: Needs to continue to work on cutting back.   5. Obesity: Weight a bid higher.    - He will continue tirzepatide .  - needs to increase exercise and cut back calories.   Followup in 6 months with APP.   I spent 32 minutes reviewing records, interviewing/examining patient, and managing orders.   Sean Shuck, MD 04/22/24

## 2024-05-01 ENCOUNTER — Other Ambulatory Visit: Payer: Self-pay | Admitting: Adult Health
